# Patient Record
Sex: Female | Born: 1945 | Race: White | Hispanic: No | Marital: Married | Smoking: Never smoker
Health system: Southern US, Community
[De-identification: ages and names within clinical notes are randomized; demographics above are authoritative.]

## PROBLEM LIST (undated history)

## (undated) DIAGNOSIS — R609 Edema, unspecified: Secondary | ICD-10-CM

## (undated) DIAGNOSIS — I5032 Chronic diastolic (congestive) heart failure: Secondary | ICD-10-CM

## (undated) DIAGNOSIS — I10 Essential (primary) hypertension: Secondary | ICD-10-CM

## (undated) DIAGNOSIS — E785 Hyperlipidemia, unspecified: Secondary | ICD-10-CM

## (undated) DIAGNOSIS — M19042 Primary osteoarthritis, left hand: Secondary | ICD-10-CM

## (undated) DIAGNOSIS — I209 Angina pectoris, unspecified: Secondary | ICD-10-CM

## (undated) DIAGNOSIS — I272 Pulmonary hypertension, unspecified: Secondary | ICD-10-CM

## (undated) DIAGNOSIS — E11319 Type 2 diabetes mellitus with unspecified diabetic retinopathy without macular edema: Secondary | ICD-10-CM

## (undated) DIAGNOSIS — M51369 Other intervertebral disc degeneration, lumbar region without mention of lumbar back pain or lower extremity pain: Secondary | ICD-10-CM

## (undated) DIAGNOSIS — J309 Allergic rhinitis, unspecified: Secondary | ICD-10-CM

## (undated) DIAGNOSIS — E119 Type 2 diabetes mellitus without complications: Secondary | ICD-10-CM

## (undated) DIAGNOSIS — R0609 Other forms of dyspnea: Secondary | ICD-10-CM

## (undated) DIAGNOSIS — M48061 Spinal stenosis, lumbar region without neurogenic claudication: Secondary | ICD-10-CM

## (undated) DIAGNOSIS — I4729 Other ventricular tachycardia: Secondary | ICD-10-CM

## (undated) DIAGNOSIS — M5136 Other intervertebral disc degeneration, lumbar region: Secondary | ICD-10-CM

## (undated) DIAGNOSIS — F329 Major depressive disorder, single episode, unspecified: Secondary | ICD-10-CM

## (undated) DIAGNOSIS — F32A Depression, unspecified: Secondary | ICD-10-CM

## (undated) DIAGNOSIS — R0600 Dyspnea, unspecified: Secondary | ICD-10-CM

## (undated) DIAGNOSIS — E669 Obesity, unspecified: Secondary | ICD-10-CM

## (undated) DIAGNOSIS — M19041 Primary osteoarthritis, right hand: Secondary | ICD-10-CM

## (undated) DIAGNOSIS — G4733 Obstructive sleep apnea (adult) (pediatric): Secondary | ICD-10-CM

## (undated) DIAGNOSIS — G4730 Sleep apnea, unspecified: Secondary | ICD-10-CM

## (undated) DIAGNOSIS — M4802 Spinal stenosis, cervical region: Secondary | ICD-10-CM

## (undated) DIAGNOSIS — M810 Age-related osteoporosis without current pathological fracture: Secondary | ICD-10-CM

## (undated) DIAGNOSIS — M1990 Unspecified osteoarthritis, unspecified site: Secondary | ICD-10-CM

## (undated) DIAGNOSIS — K219 Gastro-esophageal reflux disease without esophagitis: Secondary | ICD-10-CM

## (undated) DIAGNOSIS — G959 Disease of spinal cord, unspecified: Secondary | ICD-10-CM

## (undated) DIAGNOSIS — N183 Chronic kidney disease, stage 3 unspecified: Secondary | ICD-10-CM

## (undated) DIAGNOSIS — Z22322 Carrier or suspected carrier of Methicillin resistant Staphylococcus aureus: Secondary | ICD-10-CM

## (undated) DIAGNOSIS — Z8672 Personal history of thrombophlebitis: Secondary | ICD-10-CM

## (undated) DIAGNOSIS — I700 Atherosclerosis of aorta: Secondary | ICD-10-CM

## (undated) DIAGNOSIS — D649 Anemia, unspecified: Secondary | ICD-10-CM

## (undated) DIAGNOSIS — I2510 Atherosclerotic heart disease of native coronary artery without angina pectoris: Secondary | ICD-10-CM

## (undated) DIAGNOSIS — G5600 Carpal tunnel syndrome, unspecified upper limb: Secondary | ICD-10-CM

## (undated) HISTORY — DX: Depression, unspecified: F32.A

## (undated) HISTORY — DX: Edema, unspecified: R60.9

## (undated) HISTORY — DX: Atherosclerotic heart disease of native coronary artery without angina pectoris: I25.10

## (undated) HISTORY — DX: Allergic rhinitis, unspecified: J30.9

## (undated) HISTORY — PX: ABDOMINAL HYSTERECTOMY: SHX81

## (undated) HISTORY — DX: Essential (primary) hypertension: I10

## (undated) HISTORY — DX: Hyperlipidemia, unspecified: E78.5

## (undated) HISTORY — DX: Major depressive disorder, single episode, unspecified: F32.9

## (undated) HISTORY — PX: ADENOIDECTOMY: SUR15

## (undated) HISTORY — DX: Unspecified osteoarthritis, unspecified site: M19.90

## (undated) HISTORY — DX: Chronic diastolic (congestive) heart failure: I50.32

## (undated) HISTORY — DX: Sleep apnea, unspecified: G47.30

## (undated) HISTORY — DX: Type 2 diabetes mellitus without complications: E11.9

## (undated) HISTORY — DX: Type 2 diabetes mellitus with unspecified diabetic retinopathy without macular edema: E11.319

## (undated) HISTORY — DX: Carrier or suspected carrier of methicillin resistant Staphylococcus aureus: Z22.322

---

## 1987-02-06 HISTORY — PX: CHOLECYSTECTOMY: SHX55

## 1987-02-06 HISTORY — PX: APPENDECTOMY: SHX54

## 1996-02-06 HISTORY — PX: OTHER SURGICAL HISTORY: SHX169

## 2001-08-05 HISTORY — PX: ROTATOR CUFF REPAIR: SHX139

## 2001-09-04 ENCOUNTER — Other Ambulatory Visit: Admission: RE | Admit: 2001-09-04 | Discharge: 2001-09-04 | Payer: Self-pay | Admitting: Family Medicine

## 2004-07-19 ENCOUNTER — Ambulatory Visit: Payer: Self-pay | Admitting: Family Medicine

## 2004-09-14 ENCOUNTER — Ambulatory Visit: Payer: Self-pay | Admitting: Family Medicine

## 2005-09-04 ENCOUNTER — Ambulatory Visit: Payer: Self-pay | Admitting: Family Medicine

## 2005-10-11 ENCOUNTER — Ambulatory Visit: Payer: Self-pay | Admitting: Family Medicine

## 2005-12-10 ENCOUNTER — Ambulatory Visit: Payer: Self-pay | Admitting: Family Medicine

## 2005-12-10 ENCOUNTER — Other Ambulatory Visit: Admission: RE | Admit: 2005-12-10 | Discharge: 2005-12-10 | Payer: Self-pay | Admitting: Family Medicine

## 2005-12-10 ENCOUNTER — Encounter: Payer: Self-pay | Admitting: Family Medicine

## 2005-12-24 LAB — FECAL OCCULT BLOOD, GUAIAC: Fecal Occult Blood: NEGATIVE

## 2005-12-31 ENCOUNTER — Ambulatory Visit: Payer: Self-pay | Admitting: Family Medicine

## 2006-03-08 DIAGNOSIS — Z22322 Carrier or suspected carrier of Methicillin resistant Staphylococcus aureus: Secondary | ICD-10-CM

## 2006-03-08 HISTORY — DX: Carrier or suspected carrier of methicillin resistant Staphylococcus aureus: Z22.322

## 2006-03-14 ENCOUNTER — Ambulatory Visit: Payer: Self-pay | Admitting: Family Medicine

## 2007-04-09 ENCOUNTER — Encounter: Payer: Self-pay | Admitting: Family Medicine

## 2007-04-15 ENCOUNTER — Telehealth: Payer: Self-pay | Admitting: Family Medicine

## 2007-05-22 ENCOUNTER — Encounter: Payer: Self-pay | Admitting: Family Medicine

## 2007-05-22 ENCOUNTER — Ambulatory Visit: Payer: Self-pay | Admitting: Family Medicine

## 2007-05-26 ENCOUNTER — Encounter (INDEPENDENT_AMBULATORY_CARE_PROVIDER_SITE_OTHER): Payer: Self-pay | Admitting: *Deleted

## 2007-06-04 ENCOUNTER — Encounter: Payer: Self-pay | Admitting: Family Medicine

## 2007-06-10 ENCOUNTER — Telehealth: Payer: Self-pay | Admitting: Family Medicine

## 2007-06-12 ENCOUNTER — Ambulatory Visit: Payer: Self-pay | Admitting: Family Medicine

## 2007-06-12 DIAGNOSIS — E7800 Pure hypercholesterolemia, unspecified: Secondary | ICD-10-CM

## 2007-06-13 LAB — CONVERTED CEMR LAB
ALT: 22 units/L (ref 0–35)
AST: 24 units/L (ref 0–37)
Basophils Absolute: 0 10*3/uL (ref 0.0–0.1)
Basophils Relative: 0.8 % (ref 0.0–1.0)
Bilirubin, Direct: 0.1 mg/dL (ref 0.0–0.3)
CO2: 26 meq/L (ref 19–32)
Calcium: 9.7 mg/dL (ref 8.4–10.5)
Chloride: 108 meq/L (ref 96–112)
Cholesterol: 149 mg/dL (ref 0–200)
Creatinine, Ser: 0.9 mg/dL (ref 0.4–1.2)
Glucose, Bld: 55 mg/dL — ABNORMAL LOW (ref 70–99)
Hemoglobin: 14.2 g/dL (ref 12.0–15.0)
LDL Cholesterol: 92 mg/dL (ref 0–99)
Lymphocytes Relative: 30.2 % (ref 12.0–46.0)
MCHC: 33.3 g/dL (ref 30.0–36.0)
Monocytes Relative: 8.4 % (ref 3.0–12.0)
Neutro Abs: 3.1 10*3/uL (ref 1.4–7.7)
Neutrophils Relative %: 57 % (ref 43.0–77.0)
RBC: 4.54 M/uL (ref 3.87–5.11)
TSH: 3.45 microintl units/mL (ref 0.35–5.50)
Total Bilirubin: 0.8 mg/dL (ref 0.3–1.2)
Total CHOL/HDL Ratio: 4.7
Total Protein: 7.7 g/dL (ref 6.0–8.3)
VLDL: 25 mg/dL (ref 0–40)
WBC: 5.5 10*3/uL (ref 4.5–10.5)

## 2007-10-03 ENCOUNTER — Encounter: Payer: Self-pay | Admitting: Family Medicine

## 2007-11-25 ENCOUNTER — Telehealth (INDEPENDENT_AMBULATORY_CARE_PROVIDER_SITE_OTHER): Payer: Self-pay | Admitting: *Deleted

## 2008-02-05 ENCOUNTER — Encounter: Payer: Self-pay | Admitting: Family Medicine

## 2008-02-12 ENCOUNTER — Encounter: Payer: Self-pay | Admitting: Family Medicine

## 2008-03-01 ENCOUNTER — Ambulatory Visit: Payer: Self-pay | Admitting: Family Medicine

## 2008-03-01 DIAGNOSIS — K219 Gastro-esophageal reflux disease without esophagitis: Secondary | ICD-10-CM | POA: Insufficient documentation

## 2008-03-01 DIAGNOSIS — I10 Essential (primary) hypertension: Secondary | ICD-10-CM

## 2008-03-01 DIAGNOSIS — E119 Type 2 diabetes mellitus without complications: Secondary | ICD-10-CM | POA: Insufficient documentation

## 2008-03-01 DIAGNOSIS — J309 Allergic rhinitis, unspecified: Secondary | ICD-10-CM | POA: Insufficient documentation

## 2008-03-15 ENCOUNTER — Encounter: Payer: Self-pay | Admitting: Family Medicine

## 2008-06-23 ENCOUNTER — Telehealth: Payer: Self-pay | Admitting: Family Medicine

## 2008-07-05 ENCOUNTER — Ambulatory Visit: Payer: Self-pay | Admitting: Family Medicine

## 2008-07-05 ENCOUNTER — Encounter: Payer: Self-pay | Admitting: Family Medicine

## 2008-07-07 ENCOUNTER — Encounter (INDEPENDENT_AMBULATORY_CARE_PROVIDER_SITE_OTHER): Payer: Self-pay | Admitting: *Deleted

## 2009-01-14 ENCOUNTER — Encounter: Payer: Self-pay | Admitting: Family Medicine

## 2009-05-09 ENCOUNTER — Encounter: Payer: Self-pay | Admitting: Family Medicine

## 2009-07-11 ENCOUNTER — Encounter: Payer: Self-pay | Admitting: Family Medicine

## 2009-11-08 ENCOUNTER — Encounter: Payer: Self-pay | Admitting: Family Medicine

## 2009-11-14 ENCOUNTER — Telehealth: Payer: Self-pay | Admitting: Family Medicine

## 2009-12-23 ENCOUNTER — Encounter: Payer: Self-pay | Admitting: Family Medicine

## 2009-12-23 ENCOUNTER — Ambulatory Visit: Payer: Self-pay | Admitting: Family Medicine

## 2009-12-27 ENCOUNTER — Encounter (INDEPENDENT_AMBULATORY_CARE_PROVIDER_SITE_OTHER): Payer: Self-pay | Admitting: *Deleted

## 2009-12-28 ENCOUNTER — Ambulatory Visit: Payer: Self-pay | Admitting: Family Medicine

## 2010-01-01 LAB — CONVERTED CEMR LAB
Basophils Relative: 0.6 % (ref 0.0–3.0)
Eosinophils Absolute: 0.1 10*3/uL (ref 0.0–0.7)
HCT: 38.3 % (ref 36.0–46.0)
Hemoglobin: 13.1 g/dL (ref 12.0–15.0)
Lymphs Abs: 1 10*3/uL (ref 0.7–4.0)
MCHC: 34.2 g/dL (ref 30.0–36.0)
MCV: 92.7 fL (ref 78.0–100.0)
Monocytes Absolute: 0.5 10*3/uL (ref 0.1–1.0)
Neutro Abs: 3.1 10*3/uL (ref 1.4–7.7)
RBC: 4.13 M/uL (ref 3.87–5.11)

## 2010-01-02 LAB — CONVERTED CEMR LAB
AST: 25 units/L (ref 0–37)
Alkaline Phosphatase: 64 units/L (ref 39–117)
BUN: 23 mg/dL (ref 6–23)
CO2: 23 meq/L (ref 19–32)
Calcium: 9.4 mg/dL (ref 8.4–10.5)
Chloride: 106 meq/L (ref 96–112)
Creatinine, Ser: 1.2 mg/dL (ref 0.40–1.20)
Indirect Bilirubin: 0.2 mg/dL (ref 0.0–0.9)
LDL Cholesterol: 70 mg/dL (ref 0–99)
Total Bilirubin: 0.3 mg/dL (ref 0.3–1.2)

## 2010-01-09 ENCOUNTER — Encounter: Payer: Self-pay | Admitting: Family Medicine

## 2010-03-07 NOTE — Letter (Signed)
Summary: [facility]   Imported By: Lanelle Bal 07/18/2009 08:38:36  _____________________________________________________________________  External Attachment:    Type:   Image     Comment:   External Document

## 2010-03-07 NOTE — Progress Notes (Signed)
Summary: needs order for mammogram  Phone Note Call from Patient Call back at Work Phone [phone]   Caller: Patient Call For: Judith Part MD Summary of Call: Pt needs order for mammogram, she wants to go to [facility], wants early morning appt, not a monday. Initial call taken by: Lowella Petties CMA,  November 14, 2009 10:04 AM  Follow-up for Phone Call        will do ref for [facility] Follow-up by: Judith Part MD,  November 14, 2009 10:37 AM  Additional Follow-up for Phone Call Additional follow up Details #1::        Patient notified as instructed by telephone. Pt will wait to hear from [facility].Lewanda Rife LPN  November 14, 2009 5:03 PM

## 2010-03-07 NOTE — Letter (Signed)
Summary: Results Follow up Letter  [place] at [facility]  [address] [place], [place] [address]   Phone: [phone]  Fax: [phone]    12/27/2009 MRN: [id number]      [facility] [address] [place], [place]  [address]    Dear Monica Ray,  The following are the results of your recent test(s):  Test         Result    Pap Smear:        Normal _____  Not Normal _____ Comments: ______________________________________________________ Cholesterol: LDL(Bad cholesterol):         Your goal is less than:         HDL (Good cholesterol):       Your goal is more than: Comments:  ______________________________________________________ Mammogram:        Normal __x___  Not Normal _____ Comments:Repeat in 1 year  ___________________________________________________________________ Hemoccult:        Normal _____  Not normal _______ Comments:    _____________________________________________________________________ Other Tests:    We routinely do not discuss normal results over the telephone.  If you desire a copy of the results, or you have any questions about this information we can discuss them at your next office visit.   Sincerely,  Roxy Manns MD

## 2010-03-07 NOTE — Letter (Signed)
Summary: [facility]  [facility]   Imported By: Lanelle Bal 02/24/2009 09:42:47  _____________________________________________________________________  External Attachment:    Type:   Image     Comment:   External Document  Appended Document: [facility]     Clinical Lists Changes  Observations: Added new observation of PAST MED HX: HTN hyperlipidemia  DM 2  DM retinopathy Edema endo- Morayati Mild Allergic rhinitis Depression OA- Hands  (02/24/2009 20:45) Added new observation of DMEYEEXAMNXT: 01/2010 (02/24/2009 20:45) Added new observation of DMEYEEXMRES: diabetic retinopathy (01/14/2009 20:46) Added new observation of EYE EXAM BY: Dr Benedict Needy (01/14/2009 20:46) Added new observation of DIAB EYE EX: diabetic retinopathy (01/14/2009 20:46)       Past Medical History:    HTN    hyperlipidemia     DM 2     DM retinopathy    Edema    endo- Morayati    Mild Allergic rhinitis    Depression    OA- Hands   Diabetes Management Exam:    Eye Exam:       Eye Exam done elsewhere          Date: 01/14/2009          Results: diabetic retinopathy          Done by: Dr Benedict Needy

## 2010-03-07 NOTE — Letter (Signed)
Summary: [facility]  [facility]   Imported By: Lanelle Bal 05/26/2009 08:34:57  _____________________________________________________________________  External Attachment:    Type:   Image     Comment:   External Document

## 2010-03-07 NOTE — Letter (Signed)
Summary: Sharin Grave MD  Sharin Grave MD   Imported By: Lanelle Bal 05/12/2009 14:18:47  _____________________________________________________________________  External Attachment:    Type:   Image     Comment:   External Document

## 2010-03-07 NOTE — Letter (Signed)
Summary: Dr. Sharin Grave  Dr. Sharin Grave   Imported By: Maryln Gottron 11/14/2009 15:01:32  _____________________________________________________________________  External Attachment:    Type:   Image     Comment:   External Document

## 2010-03-07 NOTE — Assessment & Plan Note (Signed)
Summary: F/U,REFILL MEDICATION/CLE   Vital Signs:  Patient profile:   65 year old female Height:      65.25 inches Weight:      222.50 pounds BMI:     36.88 Temp:     98.2 degrees F oral Pulse rate:   68 / minute Pulse rhythm:   regular BP sitting:   130 / 72  (left arm) Cuff size:   large  Vitals Entered By: Lewanda Rife LPN (December 28, 2009 8:13 AM) CC: refill med and follow up    History of Present Illness: here for f/u of HTN and lipids Dr Patrecia Pace takes care of her DM  per last note DM poorly controlled withAIc of 10.6 is on some medicine that has really lowered her blood sugar   lipids Last Lipid ProfileCholesterol: 149 (06/12/2007 9:49:00 AM)HDL:  31.8 (06/12/2007 9:49:00 AM)LDL:  92 (06/12/2007 9:49:00 AM)Triglycerides:  Last Liver profileSGOT:  24 (06/12/2007 9:49:00 AM)SPGT:  22 (06/12/2007 9:49:00 AM)T. 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   is on phenteramine for wt loss  she eats most when she gets home   pt declines both flu and pneumonia vaccines   opthy visit was in May -- Dr Benedict Needy -very mild DM retinopathy    Allergies: 1)  Diflucan (Fluconazole) 2)  Percocet  Past History:  Past Medical History: Last updated: 02/24/2009 HTN hyperlipidemia  DM 2  DM retinopathy Edema endo- Morayati Mild Allergic rhinitis Depression OA- Hands  Past Surgical History: Last updated: 03/01/2008 (1998) total Hyst (1989) CCY/ appy (08/2001) Rotator cuff (R) (1998) Barium Enema (neg) CT (03/2006) MRSA  Family History: Last updated: 03/01/2008 mother- lung ca at 67, DM, ETOH Father, Throat CA, ETOH MGM- DM Son is Drug addict  Social History: Last updated: 03/01/2008 non smoker   Married Divorced x 23 years then remarried   Review of Systems General:  Denies fatigue, fever, loss of appetite, and malaise. Eyes:  Denies blurring and eye irritation. CV:  Denies chest pain or discomfort and palpitations. Resp:  Denies cough, shortness of breath, and wheezing. GI:  Denies abdominal pain, change in bowel habits, and indigestion. MS:  Denies muscle aches and cramps. Derm:  Denies itching, lesion(s), poor wound healing, and rash. Neuro:  Denies numbness and tingling. Psych:  Complains of anxiety; eats when stressed/worried. Endo:  Denies cold intolerance, excessive thirst, excessive urination, and heat intolerance. Heme:  Denies abnormal bruising and bleeding.  Physical Exam  General:  overweight but generally well appearing  Head:  normocephalic, atraumatic, and no abnormalities observed.   Eyes:  vision grossly intact, pupils equal, pupils round, and pupils reactive to light.  no conjunctival pallor, injection or icterus  Mouth:  pharynx pink and moist.   Neck:  supple with full rom and no masses or thyromegally, no JVD or carotid bruit  Chest Wall:  No deformities, masses, or tenderness noted. Lungs:  Normal respiratory effort, chest expands symmetrically. Lungs are clear to auscultation, no crackles or wheezes. Heart:  Normal rate and regular rhythm. S1 and S2 normal without gallop, murmur, click, rub or other extra sounds. Abdomen:  Bowel sounds positive,abdomen soft and  non-tender without masses, organomegaly or hernias noted. no renal bruits  Msk:  No deformity or scoliosis noted of thoracic or lumbar spine.   Pulses:  R and L carotid,radial,femoral,dorsalis pedis and posterior tibial pulses are full and equal bilaterally Extremities:  No clubbing, cyanosis, edema, or deformity noted with normal full range of motion of all joints.   Neurologic:  sensation intact to light touch, gait normal, and DTRs symmetrical and normal.   Skin:  Intact without suspicious  lesions or rashes Cervical Nodes:  No lymphadenopathy noted Psych:  normal affect, talkative and pleasant   Diabetes Management Exam:    Foot Exam (with socks and/or shoes not present):       Sensory-Pinprick/Light touch:          Left medial foot (L-4): normal          Left dorsal foot (L-5): normal          Left lateral foot (S-1): normal          Right medial foot (L-4): normal          Right dorsal foot (L-5): normal          Right lateral foot (S-1): normal       Sensory-Monofilament:          Left foot: normal          Right foot: normal       Inspection:          Left foot: normal          Right foot: normal       Nails:          Left foot: normal          Right foot: normal    Eye Exam:       Eye Exam done elsewhere          Date: 06/05/2009          Results: diabetic retinopathy          Done by: Dr Benedict Needy   Impression & Recommendations:  Problem # 1:  HYPERTENSION (ICD-401.9) Assessment Unchanged  is well controlled lab today refilled meds disc healthy diet (low simple sugar/ choose complex carbs/ low sat fat) diet and exercise in detail  needs wt loss Her updated medication list for this problem includes:    Enalapril Maleate 20 Mg Tabs (Enalapril maleate) .Marland Kitchen... Take one by mouth two times a day    Furosemide 20 Mg Tabs (Furosemide) ..... One by mouth two times a day  Orders: Venipuncture (04540) TLB-CBC Platelet - w/Differential (85025-CBCD) T- * Misc. Laboratory test [phone])  BP today: 130/72 Prior BP: 124/70 (03/01/2008)  Labs Reviewed: K+: 3.7 (06/12/2007) Creat: : 0.9 (06/12/2007)   Chol: 149 (06/12/2007)   HDL: 31.8 (06/12/2007)   LDL: 92 (06/12/2007)   TG: 126 (06/12/2007)  Problem # 2:  HYPERCHOLESTEROLEMIA (ICD-272.0) Assessment: Unchanged  check lipids today on lipitor and tricor disc low sat fat diet  refills done  f/u 6 mo Her updated medication list for this problem includes:    Lipitor 40 Mg Tabs (Atorvastatin calcium) .Marland Kitchen... Take  one tab by mouth daily    Tricor 145 Mg Tabs (Fenofibrate) .Marland Kitchen... Take 1 tablet by mouth once a day  Orders: Venipuncture (14782) TLB-CBC Platelet - w/Differential (85025-CBCD) T- * Misc. Laboratory test [phone])  Labs Reviewed: SGOT: 24 (06/12/2007)   SGPT: 22 (06/12/2007)   HDL:31.8 (06/12/2007)  LDL:92 (06/12/2007)  Chol:149 (06/12/2007)  Trig:126 (06/12/2007)  Problem # 3:  DIABETES-TYPE 2 (ICD-250.00) Assessment: Deteriorated poorly controlled but improving  covered by Dr Judie Petit  disc poss need in future for counseling to stop compulsive overeating  Her updated medication list for this problem includes:    Enalapril Maleate 20 Mg Tabs (Enalapril maleate) .Marland Kitchen... Take one by mouth two times a day    Lantus 100 Unit/ml Soln (Insulin glargine) .Marland KitchenMarland KitchenMarland KitchenMarland Kitchen 60 units before breakfast and 60 units before supper    Humalog 100 Unit/ml Soln (Insulin lispro (human)) .Marland Kitchen... Three times a day 15-10-15 units sliding scale > 200    Actos 45 Mg Tabs (Pioglitazone hcl) .Marland Kitchen... Take 1 tablet by mouth once a day    Victoza 18 Mg/2ml Soln (Liraglutide) .Marland Kitchen... 1.8 units daily  Complete Medication List: 1)  Lipitor 40 Mg Tabs (Atorvastatin calcium) .... Take one tab by mouth daily 2)  Enalapril Maleate 20 Mg Tabs (Enalapril maleate) .... Take one by mouth two times a day 3)  Mobic 7.5 Mg Tabs (Meloxicam) .... Take one by mouth daily 4)  Furosemide 20 Mg Tabs (Furosemide) .... One by mouth two times a day 5)  Tricor 145 Mg Tabs (Fenofibrate) .... Take 1 tablet by mouth once a day 6)  Fish Oil Oil (Fish oil) .... Four tabs daily 7)  Lantus 100 Unit/ml Soln (Insulin glargine) .... 60 units before breakfast and 60 units before supper 8)  Humalog 100 Unit/ml Soln (Insulin lispro (human)) .... Three times a day 15-10-15 units sliding scale > 200 9)  Actos 45 Mg Tabs (Pioglitazone hcl) .... Take 1 tablet by mouth once a day 10)  Victoza 18 Mg/64ml Soln (Liraglutide) .... 1.8 units daily 11)  Phentermine Hcl 37.5 Mg Tabs  (Phentermine hcl) .... 1/2 tablet by mouth at lunch every day  Patient Instructions: 1)  please keep working on weight loss 2)  It is important that you exercise reguarly at least 20 minutes 5 times a week. If you develop chest pain, have severe difficulty breathing, or feel very tired, stop exercising immediately and seek medical attention.  3)  no change in meds 4)  lab today 5)  follow up in 6 months  Prescriptions: TRICOR 145 MG TABS (FENOFIBRATE) Take 1 tablet by mouth once a day  #30 x 11   Entered and Authorized by:   Judith Part MD   Signed by:   Judith Part MD on 12/28/2009   Method used:   Electronically to        [facility] (retail)       [address]       [place], [place]  [id number]       Ph: [phone]       Fax: [phone]   RxID:   [id number] FUROSEMIDE 20 MG TABS (FUROSEMIDE) one by mouth two times a day  #60 x 11   Entered and Authorized by:   Judith Part MD   Signed by:   Judith Part MD on 12/28/2009   Method used:   Electronically to        [facility] (retail)       [address]       [place], [place]  [id number]       Ph: [phone]       Fax: [phone]   RxID:   [id number] MOBIC 7.5 MG  TABS (MELOXICAM) take one  by mouth daily  #30 x 11   Entered and Authorized by:   Judith Part MD   Signed by:   Judith Part MD on 12/28/2009   Method used:   Electronically to        [facility] (retail)       [address]       [place], [place]  [address]       Ph: [phone]       Fax: [phone]   RxID:   [id number] ENALAPRIL MALEATE 20 MG  TABS (ENALAPRIL MALEATE) take one by mouth two times a day  #60 x 11   Entered and Authorized by:   Judith Part MD   Signed by:   Judith Part MD on 12/28/2009   Method used:   Electronically to        [facility] (retail)       [address]       [place], [place]  [address]       Ph: [phone]       Fax: [phone]   RxID:   [id number] LIPITOR 40 MG  TABS (ATORVASTATIN CALCIUM) take one tab by mouth daily  #30 x 11   Entered and Authorized by:   Judith Part MD   Signed by:   Judith Part MD on 12/28/2009   Method used:   Electronically to        [facility] (retail)       [address]       [place], [place]  [address]       Ph: [phone]       Fax: [phone]   RxID:   [id number]    Orders Added: 1)  Venipuncture [54627] 2)  TLB-CBC Platelet - w/Differential [85025-CBCD] 3)  T- * Misc. Laboratory test [99999] 4)  Est. Patient Level IV [03500]    Current Allergies (reviewed today): DIFLUCAN (FLUCONAZOLE) PERCOCET

## 2010-03-09 NOTE — Letter (Signed)
Summary: [facility]-Dr. [facility]-Dr. Patrecia Pace   Imported By: Maryln Gottron 01/16/2010 13:07:07  _____________________________________________________________________  External Attachment:    Type:   Image     Comment:   External Document

## 2011-01-15 ENCOUNTER — Other Ambulatory Visit: Payer: Self-pay

## 2011-01-15 MED ORDER — MELOXICAM 7.5 MG PO TABS: 7.5000 mg | ORAL_TABLET | Freq: Every day | ORAL | Status: DC

## 2011-01-15 NOTE — Telephone Encounter (Signed)
Monica Ray pharmacy request refill Meloxicam 7.5 mg last filled 12/14/10 and pt last seen 12/28/09.Please advise.

## 2011-01-15 NOTE — Telephone Encounter (Signed)
Please sched f/u this winter Px written for call in   I don't think [facility] does elect px

## 2011-01-16 ENCOUNTER — Other Ambulatory Visit: Payer: Self-pay

## 2011-01-16 MED ORDER — ENALAPRIL MALEATE 20 MG PO TABS: 20.0000 mg | ORAL_TABLET | Freq: Two times a day (BID) | ORAL | Status: DC

## 2011-01-16 NOTE — Telephone Encounter (Signed)
Monica Ray faxed refill request Enalapril 20 mg taking 1 tab by mouth two times daily.Pt last seen 12/28/09.last refill 10/13/10.Please advise. Pt does not have scheduled appt.

## 2011-01-16 NOTE — Telephone Encounter (Signed)
Unable to reach pt by phone. Medication phoned to [facility] pharmacy as instructed and note attached for pt to call for f/u this winter.

## 2011-01-16 NOTE — Telephone Encounter (Signed)
Please schedule f/u this winter-thanks for the heads up  Px written for call in

## 2011-01-18 NOTE — Telephone Encounter (Signed)
Medication phoned to [facility] as instructed. Pt scheduled f/u 02/20/11 at 8:30am.

## 2011-01-18 NOTE — Telephone Encounter (Signed)
Patient notified as instructed by telephone. Tried to call asher mcadams [phone], no answer and no v/m will try again.

## 2011-02-19 ENCOUNTER — Encounter: Payer: Self-pay | Admitting: Family Medicine

## 2011-02-20 ENCOUNTER — Encounter: Payer: Self-pay | Admitting: Family Medicine

## 2011-02-20 ENCOUNTER — Ambulatory Visit (INDEPENDENT_AMBULATORY_CARE_PROVIDER_SITE_OTHER): Payer: 59 | Admitting: Family Medicine

## 2011-02-20 VITALS — BP 144/72 | HR 92 | Temp 98.6°F | Ht 65.25 in | Wt 234.2 lb

## 2011-02-20 DIAGNOSIS — Z23 Encounter for immunization: Secondary | ICD-10-CM

## 2011-02-20 DIAGNOSIS — I10 Essential (primary) hypertension: Secondary | ICD-10-CM

## 2011-02-20 DIAGNOSIS — E7800 Pure hypercholesterolemia, unspecified: Secondary | ICD-10-CM

## 2011-02-20 DIAGNOSIS — Z1231 Encounter for screening mammogram for malignant neoplasm of breast: Secondary | ICD-10-CM

## 2011-02-20 MED ORDER — ROSUVASTATIN CALCIUM 10 MG PO TABS: 10.0000 mg | ORAL_TABLET | Freq: Every day | ORAL | Status: DC

## 2011-02-20 NOTE — Progress Notes (Signed)
Subjective:    Patient ID: Monica Ray, female    DOB: 02-Apr-1945, 66 y.o.   MRN: [id number]  HPI Here for f/u of HTN and lipids and to ref for mammogram  In general could feel better.....  Is concerned about lipitor  Went a month without it - and she felt a bit better -- a little less back pain  Wants to try something else Chol went way up  Is on med right now    Pt has DM followed by Dr Patrecia Pace On insulin and oral tx Insulin pump - for 4 weeks     Wt is up 12 lb bmi 38 Has lost 4 lb recently - proud of that   Has severe back problems -- with her back  Cannot exercise due to that      bp is 144/72     Today Has been very good at home and endo-- did not take pill today No cp or palpitations or headaches or edema  No side effects to medicines   On ace   Lipids Lab Results  Component Value Date   CHOL 133 12/28/2009   HDL 36* 12/28/2009   LDLCALC 70 12/28/2009   TRIG 136 12/28/2009   CHOLHDL 3.7 Ratio 12/28/2009   is on lipitor  Last labs 2 mo ago Dr Judie Petit-- off med and LDL was very high  Off the tricor now  Due for labs  Patient Active Problem List  Diagnoses  . DIABETES-TYPE 2  . HYPERCHOLESTEROLEMIA  . HYPERTENSION  . ALLERGIC RHINITIS  . GERD  . Other screening mammogram   Past Medical History  Diagnosis Date  . Hypertension   . Hyperlipidemia   . Diabetes mellitus, type 2   . DM retinopathy   . Edema   . Mild allergic rhinitis   . Depression   . OA (osteoarthritis)     hands  . MRSA (methicillin resistant staph aureus) culture positive 03/2006   Past Surgical History  Procedure Date  . Cholecystectomy 1989  . Appendectomy 1989  . Rotator cuff repair 08/2001    right  . Barium enema 1998    neg CT   History  Substance Use Topics  . Smoking status: Never Smoker   . Smokeless tobacco: Not on file  . Alcohol Use: Not on file   Family History  Problem Relation Age of Onset  . Cancer Mother 25    lung  . Diabetes Mother   .  Alcohol abuse Mother   . Alcohol abuse Father   . Cancer Father     throat  . Diabetes Maternal Grandmother    Allergies  Allergen Reactions  . Fluconazole     REACTION: Itching  . Oxycodone-Acetaminophen     REACTION: Itching   Current Outpatient Prescriptions on File Prior to Visit  Medication Sig Dispense Refill  . enalapril (VASOTEC) 20 MG tablet Take 1 tablet (20 mg total) by mouth 2 (two) times daily.  60 tablet  1  . furosemide (LASIX) 20 MG tablet Take 20 mg by mouth 2 (two) times daily.      . insulin lispro (HUMALOG) 100 UNIT/ML injection On insulin pump Dr Bonnita [place] monitors,      . meloxicam (MOBIC) 7.5 MG tablet Take 1 tablet (7.5 mg total) by mouth daily.  30 tablet  1  . Omega-3 Fatty Acids (FISH OIL PO) Take 2 capsules by mouth daily.       . pioglitazone (ACTOS) 45 MG tablet  Take 45 mg by mouth daily.      . insulin glargine (LANTUS) 100 UNIT/ML injection 60 units before breakfast and 60 units before supper      . Liraglutide (VICTOZA) 18 MG/3ML SOLN Inject into the skin. Inject 1.8 units daily      . phentermine (ADIPEX-P) 37.5 MG tablet Take 1/2 by mouth at lunch every day            Review of Systems Review of Systems  Constitutional: Negative for fever, appetite change, and unexpected weight change. pos for fatigue  Eyes: Negative for pain and visual disturbance.  Respiratory: Negative for cough and shortness of breath.   Cardiovascular: Negative for cp or palpitations    Gastrointestinal: Negative for nausea, diarrhea and constipation.  Genitourinary: Negative for urgency and frequency. no excessive thirst  Skin: Negative for pallor or rash   MSK pos for low back pain radiating to legs  Neurological: Negative for weakness, light-headedness, numbness and headaches.  Hematological: Negative for adenopathy. Does not bruise/bleed easily.  Psychiatric/Behavioral: Negative for dysphoric mood. The patient is not nervous/anxious.          Objective:    Physical Exam  Constitutional: She appears well-developed and well-nourished. No distress.       Obese and well appearing   HENT:  Head: Normocephalic and atraumatic.  Mouth/Throat: Oropharynx is clear and moist.  Eyes: Conjunctivae and EOM are normal. Pupils are equal, round, and reactive to light. No scleral icterus.  Neck: Normal range of motion. Neck supple. No JVD present. No thyromegaly present.  Cardiovascular: Normal rate, regular rhythm, normal heart sounds and intact distal pulses.  Exam reveals no gallop.   Pulmonary/Chest: Effort normal and breath sounds normal. No respiratory distress. She has no wheezes.  Abdominal: Soft. Bowel sounds are normal. She exhibits no distension, no abdominal bruit and no mass. There is no tenderness.  Musculoskeletal: Normal range of motion. She exhibits tenderness.       Trace pedal edema Lower lumbar tenderness with poor rom   Lymphadenopathy:    She has no cervical adenopathy.  Neurological: She is alert. She has normal reflexes. No cranial nerve deficit. She exhibits normal muscle tone. Coordination normal.  Skin: Skin is warm and dry. No rash noted. No erythema. No pallor.  Psychiatric: She has a normal mood and affect.          Assessment & Plan:

## 2011-02-20 NOTE — Assessment & Plan Note (Signed)
Scheduled annual screening mammogram Nl breast exam today  Encouraged monthly self exams   

## 2011-02-20 NOTE — Assessment & Plan Note (Signed)
Intolerant of lipitor but needs statin Will try crestor 10 mg and see how she does Lab 6 wk Rev low sat fat diet

## 2011-02-20 NOTE — Assessment & Plan Note (Signed)
bp in fair control at this time (up a bit without med today) No changes needed  Disc lifstyle change with low sodium diet and exercise   Sent for last labs Stressed imp of wt loss and exercise and disc this in great detail today

## 2011-02-20 NOTE — Patient Instructions (Signed)
Try crestor 10 instead of lipitor  Update me if side effects  Schedule fasting labs here in 6 weeks  Think about water or recumbant bike for exercise  Think about getting your back checked out  Please send for last labs from Dr Patrecia Pace We will schedule mammo at check out

## 2011-03-21 ENCOUNTER — Encounter: Payer: Self-pay | Admitting: Family Medicine

## 2011-03-21 ENCOUNTER — Ambulatory Visit: Payer: Self-pay | Admitting: Family Medicine

## 2011-03-26 ENCOUNTER — Encounter: Payer: Self-pay | Admitting: *Deleted

## 2011-04-03 ENCOUNTER — Other Ambulatory Visit (INDEPENDENT_AMBULATORY_CARE_PROVIDER_SITE_OTHER): Payer: 59

## 2011-04-03 DIAGNOSIS — E7800 Pure hypercholesterolemia, unspecified: Secondary | ICD-10-CM

## 2011-04-03 LAB — COMPREHENSIVE METABOLIC PANEL
ALT: 21 U/L (ref 0–35)
Albumin: 3.7 g/dL (ref 3.5–5.2)
CO2: 24 mEq/L (ref 19–32)
Chloride: 108 mEq/L (ref 96–112)
GFR: 70.68 mL/min (ref >60.00)
Potassium: 4 mEq/L (ref 3.5–5.1)
Sodium: 140 mEq/L (ref 135–145)
Total Bilirubin: 0.6 mg/dL (ref 0.3–1.2)
Total Protein: 7 g/dL (ref 6.0–8.3)

## 2011-04-03 LAB — LIPID PANEL
HDL: 39.5 mg/dL (ref >39.00)
Total CHOL/HDL Ratio: 4

## 2011-04-09 ENCOUNTER — Other Ambulatory Visit: Payer: Self-pay | Admitting: *Deleted

## 2011-04-09 MED ORDER — MELOXICAM 7.5 MG PO TABS: 7.5000 mg | ORAL_TABLET | Freq: Every day | ORAL | Status: DC

## 2011-04-09 MED ORDER — ENALAPRIL MALEATE 20 MG PO TABS: 20.0000 mg | ORAL_TABLET | Freq: Two times a day (BID) | ORAL | Status: DC

## 2011-04-09 NOTE — Telephone Encounter (Signed)
Will refill electronically  

## 2011-04-09 NOTE — Telephone Encounter (Signed)
Last refill: 12/11/2011

## 2011-06-25 ENCOUNTER — Encounter: Payer: Self-pay | Admitting: Family Medicine

## 2011-08-27 ENCOUNTER — Other Ambulatory Visit: Payer: Self-pay | Admitting: *Deleted

## 2011-08-27 MED ORDER — MELOXICAM 7.5 MG PO TABS: 7.5000 mg | ORAL_TABLET | Freq: Every day | ORAL | Status: DC

## 2011-08-27 NOTE — Telephone Encounter (Signed)
Will refill electronically  

## 2012-01-14 ENCOUNTER — Telehealth: Payer: Self-pay

## 2012-01-14 DIAGNOSIS — E119 Type 2 diabetes mellitus without complications: Secondary | ICD-10-CM

## 2012-01-14 NOTE — Telephone Encounter (Signed)
pt received letter from endocrinologist that she needed to find new doctor in next 30 days; pt request referral to Dr. Tedd Sias at [facility]. Please advise.

## 2012-01-15 NOTE — Telephone Encounter (Signed)
Appt made with Dr Tedd Sias patient aware.

## 2012-03-03 ENCOUNTER — Other Ambulatory Visit: Payer: Self-pay | Admitting: *Deleted

## 2012-03-03 NOTE — Telephone Encounter (Signed)
Please schedule f/u this spring and refil until then

## 2012-03-03 NOTE — Telephone Encounter (Signed)
Left voicemail requesting pt to call office 

## 2012-03-06 NOTE — Telephone Encounter (Signed)
Left 2nd voicemail requesting pt to call office  

## 2012-03-07 NOTE — Telephone Encounter (Signed)
Left 3rd voicemail and mailed letter letting pt know she needs to schedule a f/u appt before meds can be refilled

## 2012-03-14 NOTE — Telephone Encounter (Signed)
Called pt an still no answer and she never responded to my letter, declined meds until she has appt

## 2012-03-25 ENCOUNTER — Other Ambulatory Visit: Payer: Self-pay

## 2012-03-25 MED ORDER — ENALAPRIL MALEATE 20 MG PO TABS: 20.0000 mg | ORAL_TABLET | Freq: Two times a day (BID) | ORAL | Status: DC

## 2012-03-25 MED ORDER — FUROSEMIDE 20 MG PO TABS: 20.0000 mg | ORAL_TABLET | Freq: Two times a day (BID) | ORAL | Status: DC

## 2012-03-25 NOTE — Telephone Encounter (Signed)
Please give her 3 mo of those meds and let her know that she can now make her own mammogram appt at [facility]- she does not need a referral any longer-thanks

## 2012-03-25 NOTE — Telephone Encounter (Signed)
Pt left v/m requesting refill enalapril, furosemide (done) and meloxicam to [facility]. Pt scheduled appt to see Dr Milinda Antis 04/29/12. Pt also request an early morning appt for mammogram at [facility].Please advise. Pt request call back when done.

## 2012-03-26 MED ORDER — MELOXICAM 7.5 MG PO TABS: 7.5000 mg | ORAL_TABLET | Freq: Every day | ORAL | Status: DC

## 2012-03-26 NOTE — Telephone Encounter (Signed)
meds refilled and pt aware she can schedule her own mammo and I gave her their phone #

## 2012-04-29 ENCOUNTER — Encounter: Payer: Self-pay | Admitting: Family Medicine

## 2012-04-29 ENCOUNTER — Ambulatory Visit (INDEPENDENT_AMBULATORY_CARE_PROVIDER_SITE_OTHER): Payer: 59 | Admitting: Family Medicine

## 2012-04-29 VITALS — BP 112/70 | HR 70 | Temp 98.0°F

## 2012-04-29 DIAGNOSIS — I10 Essential (primary) hypertension: Secondary | ICD-10-CM

## 2012-04-29 DIAGNOSIS — Z8249 Family history of ischemic heart disease and other diseases of the circulatory system: Secondary | ICD-10-CM

## 2012-04-29 DIAGNOSIS — E7800 Pure hypercholesterolemia, unspecified: Secondary | ICD-10-CM

## 2012-04-29 DIAGNOSIS — E669 Obesity, unspecified: Secondary | ICD-10-CM | POA: Insufficient documentation

## 2012-04-29 LAB — CBC WITH DIFFERENTIAL/PLATELET
Basophils Absolute: 0.1 10*3/uL (ref 0.0–0.1)
Eosinophils Absolute: 0.2 10*3/uL (ref 0.0–0.7)
HCT: 41.2 % (ref 36.0–46.0)
Lymphs Abs: 1.1 10*3/uL (ref 0.7–4.0)
MCHC: 33.5 g/dL (ref 30.0–36.0)
MCV: 92 fl (ref 78.0–100.0)
Monocytes Absolute: 0.5 10*3/uL (ref 0.1–1.0)
Monocytes Relative: 10.7 % (ref 3.0–12.0)
Platelets: 273 10*3/uL (ref 150.0–400.0)
RDW: 13.7 % (ref 11.5–14.6)

## 2012-04-29 LAB — COMPREHENSIVE METABOLIC PANEL
ALT: 19 U/L (ref 0–35)
AST: 20 U/L (ref 0–37)
Alkaline Phosphatase: 88 U/L (ref 39–117)
Sodium: 140 mEq/L (ref 135–145)
Total Bilirubin: 0.6 mg/dL (ref 0.3–1.2)
Total Protein: 7.7 g/dL (ref 6.0–8.3)

## 2012-04-29 LAB — LIPID PANEL
HDL: 36.8 mg/dL — ABNORMAL LOW (ref >39.00)
Total CHOL/HDL Ratio: 4
Triglycerides: 159 mg/dL — ABNORMAL HIGH (ref 0.0–149.0)
VLDL: 31.8 mg/dL (ref 0.0–40.0)

## 2012-04-29 MED ORDER — ROSUVASTATIN CALCIUM 10 MG PO TABS: 10.0000 mg | ORAL_TABLET | Freq: Every day | ORAL | Status: DC

## 2012-04-29 MED ORDER — FUROSEMIDE 20 MG PO TABS: 20.0000 mg | ORAL_TABLET | Freq: Two times a day (BID) | ORAL | Status: DC

## 2012-04-29 MED ORDER — ENALAPRIL MALEATE 20 MG PO TABS: 20.0000 mg | ORAL_TABLET | Freq: Two times a day (BID) | ORAL | Status: DC

## 2012-04-29 MED ORDER — MELOXICAM 7.5 MG PO TABS: 7.5000 mg | ORAL_TABLET | Freq: Every day | ORAL | Status: DC

## 2012-04-29 NOTE — Assessment & Plan Note (Signed)
Discussed how this problem influences overall health and the risks it imposes  Reviewed plan for weight loss with lower calorie diet (via better food choices and also portion control or program like weight watchers) and exercise building up to or more than 30 minutes 5 days per week including some aerobic activity    Pt states she has not time for self care right now -and this is unfortunate- If wt increases, her medical problems will likely as well

## 2012-04-29 NOTE — Patient Instructions (Signed)
Labs today  Try your best to take care of yourself - diet and exercise  Call insurance co- let them know you have a strong family history of abdominal aortic aneurysm- ask if will pay for a screening ultrasound and let us know

## 2012-04-29 NOTE — Assessment & Plan Note (Signed)
bp in fair control at this time  No changes needed  Disc lifstyle change with low sodium diet and exercise  Lab today 

## 2012-04-29 NOTE — Progress Notes (Signed)
Subjective:    Patient ID: Monica Ray, female    DOB: 1946/01/11, 67 y.o.   MRN: [id number]  HPI Here for f/u of chronic problems    Lots of arthritis pain - especially in hands  Also tends to retain fluid  On actos and mobic    Has insulin pump - and her diabetes is improved - has made a big difference   bp is stable today  No cp or palpitations or headaches or edema  No side effects to medicines  BP Readings from Last 3 Encounters:  04/29/12 112/70  02/20/11 144/72  12/28/09 130/72     Diet and exercise - not good - no time - works very long hours and she in general cannot take care of herself the way she needs to  Back problems - cannot do a lot   She has fam hx of AAA in father and MGM- is worried about this and has risk factors She will look into ins cov for screening ultrasound   Due for labs for her HTN and chol Lab Results  Component Value Date   CHOL 144 04/03/2011   HDL 39.50 04/03/2011   LDLCALC 77 04/03/2011   TRIG 139.0 04/03/2011   CHOLHDL 4 04/03/2011   On crestor -no problems with that at 10 mg daily  Her diet is fair -not always optimal due to time limitations Absolutely no time for self care   Patient Active Problem List  Diagnosis  . DIABETES-TYPE 2  . HYPERCHOLESTEROLEMIA  . HYPERTENSION  . ALLERGIC RHINITIS  . GERD  . Other screening mammogram  . Family history of abdominal aortic aneurysm   Past Medical History  Diagnosis Date  . Hypertension   . Hyperlipidemia   . Diabetes mellitus, type 2   . DM retinopathy   . Edema   . Mild allergic rhinitis   . Depression   . OA (osteoarthritis)     hands  . MRSA (methicillin resistant staph aureus) culture positive 03/2006   Past Surgical History  Procedure Laterality Date  . Cholecystectomy  1989  . Appendectomy  1989  . Rotator cuff repair  08/2001    right  . Barium enema  1998    neg CT   History  Substance Use Topics  . Smoking status: Never Smoker   . Smokeless tobacco: Not  on file  . Alcohol Use: Not on file   Family History  Problem Relation Age of Onset  . Cancer Mother 7    lung  . Diabetes Mother   . Alcohol abuse Mother   . Alcohol abuse Father   . Cancer Father     throat  . Aneurysm Father   . Diabetes Maternal Grandmother   . Aneurysm Paternal Grandmother    Allergies  Allergen Reactions  . Fluconazole     REACTION: Itching  . Oxycodone-Acetaminophen     REACTION: Itching   Current Outpatient Prescriptions on File Prior to Visit  Medication Sig Dispense Refill  . insulin glargine (LANTUS) 100 UNIT/ML injection 60 units before breakfast and 60 units before supper      . insulin lispro (HUMALOG) 100 UNIT/ML injection On insulin pump Dr Bonnita Hollow monitors,      . pioglitazone (ACTOS) 45 MG tablet Take 45 mg by mouth daily.      Marland Kitchen VITAMIN D, CHOLECALCIFEROL, PO Take 1 tablet by mouth once a week.      . calcium elemental as carbonate (BARIATRIC TUMS ULTRA) 400  MG tablet Chew 1,000 mg by mouth daily.      . Liraglutide (VICTOZA) 18 MG/3ML SOLN Inject into the skin. Inject 1.8 units daily      . Omega-3 Fatty Acids (FISH OIL PO) Take 2 capsules by mouth daily.       . phentermine (ADIPEX-P) 37.5 MG tablet Take 1/2 by mouth at lunch every day      . rosuvastatin (CRESTOR) 10 MG tablet Take 1 tablet (10 mg total) by mouth daily.  30 tablet  11   No current facility-administered medications on file prior to visit.     Review of Systems Review of Systems  Constitutional: Negative for fever, appetite change,  and unexpected weight change. pos for fatigue from her schedule Eyes: Negative for pain and visual disturbance.  Respiratory: Negative for cough and shortness of breath.   Cardiovascular: Negative for cp or palpitations    Gastrointestinal: Negative for nausea, diarrhea and constipation. neg for abd pain  Genitourinary: Negative for urgency and frequency.  Skin: Negative for pallor or rash   MSK pos for chronic back pain/  sciatica Neurological: Negative for weakness, light-headedness, numbness and headaches.  Hematological: Negative for adenopathy. Does not bruise/bleed easily.  Psychiatric/Behavioral: Negative for dysphoric mood. The patient is not nervous/anxious.         Objective:   Physical Exam  Constitutional: She appears well-developed and well-nourished. No distress.  obese and well appearing   HENT:  Head: Normocephalic and atraumatic.  Mouth/Throat: Oropharynx is clear and moist.  Eyes: Conjunctivae and EOM are normal. Pupils are equal, round, and reactive to light. Right eye exhibits no discharge. Left eye exhibits no discharge. No scleral icterus.  Neck: Normal range of motion. Neck supple. No JVD present. Carotid bruit is not present. No thyromegaly present.  Cardiovascular: Normal rate, regular rhythm, normal heart sounds and intact distal pulses.  Exam reveals no gallop.   Pulmonary/Chest: Effort normal and breath sounds normal. No respiratory distress. She has no wheezes.  Abdominal: Soft. Bowel sounds are normal. She exhibits no distension, no abdominal bruit, no pulsatile midline mass and no mass. There is no tenderness.  Musculoskeletal: She exhibits no edema.  Lymphadenopathy:    She has no cervical adenopathy.  Neurological: She is alert. She has normal reflexes.  Skin: Skin is warm and dry. No rash noted. No erythema. No pallor.  Psychiatric: She has a normal mood and affect.          Assessment & Plan:

## 2012-04-29 NOTE — Assessment & Plan Note (Signed)
Lipid check on crestor and diet  Overall - diet and exercise are poor Rev low sat fat diet

## 2012-04-29 NOTE — Assessment & Plan Note (Signed)
Father and MGM- she will call ins to see if they pay for screening

## 2012-04-30 ENCOUNTER — Encounter: Payer: Self-pay | Admitting: *Deleted

## 2012-07-25 ENCOUNTER — Ambulatory Visit: Payer: Self-pay | Admitting: Family Medicine

## 2012-07-25 ENCOUNTER — Encounter: Payer: Self-pay | Admitting: Family Medicine

## 2012-07-28 ENCOUNTER — Encounter: Payer: Self-pay | Admitting: *Deleted

## 2013-04-30 ENCOUNTER — Other Ambulatory Visit: Payer: Self-pay | Admitting: *Deleted

## 2013-04-30 MED ORDER — ENALAPRIL MALEATE 20 MG PO TABS: ORAL_TABLET | ORAL | Status: DC

## 2013-07-08 ENCOUNTER — Telehealth: Payer: Self-pay

## 2013-07-08 NOTE — Telephone Encounter (Signed)
Pt called to get mammogram referral to [facility] in [place]; advised pt since screening mammogram she can call and schedule appt at [phone]. Pt voiced understanding; pt will be checking with Clydene Pugh mcadams to see if refills needed. Pt has appt with Dr Milinda Antis 07/14/13.

## 2013-07-10 ENCOUNTER — Other Ambulatory Visit: Payer: Self-pay | Admitting: Family Medicine

## 2013-07-10 NOTE — Telephone Encounter (Signed)
Meloxicam, sorry.

## 2013-07-10 NOTE — Telephone Encounter (Signed)
Please refill times one  She has a f/u appt with me next week

## 2013-07-10 NOTE — Telephone Encounter (Signed)
Please schedule f/u in the summer and refill until then -thanks

## 2013-07-10 NOTE — Telephone Encounter (Signed)
What medicine?

## 2013-07-10 NOTE — Telephone Encounter (Signed)
Last office visit was 04/29/12 and Rx last filled at this visit with 11 refills--please advise

## 2013-07-10 NOTE — Telephone Encounter (Signed)
Last filled 03/18/13, ok to refill?

## 2013-07-13 MED ORDER — MELOXICAM 7.5 MG PO TABS: 7.5000 mg | ORAL_TABLET | Freq: Every day | ORAL | Status: DC

## 2013-07-13 NOTE — Telephone Encounter (Signed)
Refilled once, pt has f/u tomorrow

## 2013-07-14 ENCOUNTER — Ambulatory Visit (INDEPENDENT_AMBULATORY_CARE_PROVIDER_SITE_OTHER): Payer: Medicare Other | Admitting: Family Medicine

## 2013-07-14 ENCOUNTER — Encounter: Payer: Self-pay | Admitting: Family Medicine

## 2013-07-14 VITALS — BP 132/70 | HR 72 | Temp 98.7°F | Ht 65.25 in | Wt 246.2 lb

## 2013-07-14 DIAGNOSIS — I10 Essential (primary) hypertension: Secondary | ICD-10-CM

## 2013-07-14 DIAGNOSIS — Z23 Encounter for immunization: Secondary | ICD-10-CM

## 2013-07-14 DIAGNOSIS — E669 Obesity, unspecified: Secondary | ICD-10-CM

## 2013-07-14 DIAGNOSIS — E7800 Pure hypercholesterolemia, unspecified: Secondary | ICD-10-CM

## 2013-07-14 LAB — CBC WITH DIFFERENTIAL/PLATELET
BASOS ABS: 0 10*3/uL (ref 0.0–0.1)
Basophils Relative: 0.7 % (ref 0.0–3.0)
EOS PCT: 4.5 % (ref 0.0–5.0)
Eosinophils Absolute: 0.3 10*3/uL (ref 0.0–0.7)
HEMATOCRIT: 43.1 % (ref 36.0–46.0)
HEMOGLOBIN: 14.2 g/dL (ref 12.0–15.0)
LYMPHS ABS: 1.8 10*3/uL (ref 0.7–4.0)
Lymphocytes Relative: 26.2 % (ref 12.0–46.0)
MCHC: 33 g/dL (ref 30.0–36.0)
MCV: 93.3 fl (ref 78.0–100.0)
MONO ABS: 0.6 10*3/uL (ref 0.1–1.0)
MONOS PCT: 9.4 % (ref 3.0–12.0)
NEUTROS ABS: 4 10*3/uL (ref 1.4–7.7)
Neutrophils Relative %: 59.2 % (ref 43.0–77.0)
PLATELETS: 252 10*3/uL (ref 150.0–400.0)
RBC: 4.61 Mil/uL (ref 3.87–5.11)
RDW: 13.5 % (ref 11.5–15.5)
WBC: 6.8 10*3/uL (ref 4.0–10.5)

## 2013-07-14 LAB — LIPID PANEL
Cholesterol: 187 mg/dL (ref 0–200)
HDL: 38.9 mg/dL — AB (ref >39.00)
LDL Cholesterol: 127 mg/dL — ABNORMAL HIGH (ref 0–99)
NonHDL: 148.1
TRIGLYCERIDES: 105 mg/dL (ref 0.0–149.0)
Total CHOL/HDL Ratio: 5
VLDL: 21 mg/dL (ref 0.0–40.0)

## 2013-07-14 LAB — COMPREHENSIVE METABOLIC PANEL
ALK PHOS: 87 U/L (ref 39–117)
ALT: 37 U/L — ABNORMAL HIGH (ref 0–35)
AST: 42 U/L — ABNORMAL HIGH (ref 0–37)
Albumin: 3.6 g/dL (ref 3.5–5.2)
BILIRUBIN TOTAL: 0.9 mg/dL (ref 0.2–1.2)
BUN: 20 mg/dL (ref 6–23)
CO2: 23 meq/L (ref 19–32)
CREATININE: 1.1 mg/dL (ref 0.4–1.2)
Calcium: 9.2 mg/dL (ref 8.4–10.5)
Chloride: 106 mEq/L (ref 96–112)
GFR: 52.28 mL/min — AB (ref >60.00)
GLUCOSE: 177 mg/dL — AB (ref 70–99)
Potassium: 4.4 mEq/L (ref 3.5–5.1)
SODIUM: 139 meq/L (ref 135–145)
Total Protein: 7.2 g/dL (ref 6.0–8.3)

## 2013-07-14 LAB — TSH: TSH: 2.93 u[IU]/mL (ref 0.35–4.50)

## 2013-07-14 MED ORDER — ROSUVASTATIN CALCIUM 10 MG PO TABS: 10.0000 mg | ORAL_TABLET | Freq: Every day | ORAL | Status: DC

## 2013-07-14 MED ORDER — ENALAPRIL MALEATE 20 MG PO TABS: ORAL_TABLET | ORAL | Status: DC

## 2013-07-14 MED ORDER — FUROSEMIDE 20 MG PO TABS: 20.0000 mg | ORAL_TABLET | Freq: Two times a day (BID) | ORAL | Status: DC

## 2013-07-14 NOTE — Assessment & Plan Note (Signed)
bp in fair control at this time  BP Readings from Last 1 Encounters:  07/14/13 132/70  this is imp from last visit Med refilled Lab today No changes needed Disc lifstyle change with low sodium diet and exercise

## 2013-07-14 NOTE — Assessment & Plan Note (Signed)
Discussed how this problem influences overall health and the risks it imposes  Reviewed plan for weight loss with lower calorie diet (via better food choices and also portion control or program like weight watchers) and exercise building up to or more than 30 minutes 5 days per week including some aerobic activity   Pt states she cannot work on this currently because her job is overwhelming -without any hope of improvement or retirement  This is unfortunate Wt gain of 12 lb since last visit and uncontrolled dm

## 2013-07-14 NOTE — Patient Instructions (Signed)
If you are interested in a shingles/zoster vaccine - call your insurance to check on coverage,( you should not get it within 1 month of other vaccines) , then call us for a prescription  for it to take to a pharmacy that gives the shot , or make a nurse visit to get it here depending on your coverage  Pneumonia vaccine today  Labs today   Try to take care of yourself

## 2013-07-14 NOTE — Assessment & Plan Note (Signed)
Due for labs on crestor and diet  Rev low sat fat diet  Lab today

## 2013-07-14 NOTE — Progress Notes (Signed)
Subjective:    Patient ID: Monica Ray, female    DOB: 24-Jan-1946, 68 y.o.   MRN: [id number]  HPI Here for f/u of chronic medical problems   Went on medicare since last visit - this has been complicated  Having trouble getting insulin paid for - sees Dr Tedd Sias will change her meds Had a check up yesterday-improved with A1C of 9   Wt is up 12 lb with bmi of 40   She takes fair care of herself  Has a derm appt - upcoming , has a mole on her lip that will likely be removed She tries to stay out of the sun   bp is stable today  No cp or palpitations or headaches or edema  No side effects to medicines  BP Readings from Last 3 Encounters:  07/14/13 132/70  04/29/12 112/70  02/20/11 144/72     We need a cholesterol check- overdue   Diet is "terrible"- she is not motivated to change it  She blames her job- worse and worse hours -no time to care for herself and emotional eating perhaps  No time to exercise  She cannot afford to retire   Patient Active Problem List   Diagnosis Date Noted  . Family history of abdominal aortic aneurysm 04/29/2012  . Obesity, unspecified 04/29/2012  . Other screening mammogram 02/20/2011  . DIABETES-TYPE 2 03/01/2008  . HYPERTENSION 03/01/2008  . ALLERGIC RHINITIS 03/01/2008  . GERD 03/01/2008  . HYPERCHOLESTEROLEMIA 06/12/2007   Past Medical History  Diagnosis Date  . Hypertension   . Hyperlipidemia   . Diabetes mellitus, type 2   . DM retinopathy   . Edema   . Mild allergic rhinitis   . Depression   . OA (osteoarthritis)     hands  . MRSA (methicillin resistant staph aureus) culture positive 03/2006   Past Surgical History  Procedure Laterality Date  . Cholecystectomy  1989  . Appendectomy  1989  . Rotator cuff repair  08/2001    right  . Barium enema  1998    neg CT   History  Substance Use Topics  . Smoking status: Never Smoker   . Smokeless tobacco: Not on file  . Alcohol Use: No   Family History  Problem Relation  Age of Onset  . Cancer Mother 65    lung  . Diabetes Mother   . Alcohol abuse Mother   . Alcohol abuse Father   . Cancer Father     throat  . Aneurysm Father   . Diabetes Maternal Grandmother   . Aneurysm Paternal Grandmother    Allergies  Allergen Reactions  . Amoxicillin Hives    Break out in rash  . Fluconazole     REACTION: Itching  . Oxycodone-Acetaminophen     REACTION: Itching   Current Outpatient Prescriptions on File Prior to Visit  Medication Sig Dispense Refill  . enalapril (VASOTEC) 20 MG tablet One tablet by mouth twice daily. *Please make an appointment for fasting labs, and one with Dr the following week, for refills*  60 tablet  0  . furosemide (LASIX) 20 MG tablet Take 1 tablet (20 mg total) by mouth 2 (two) times daily.  60 tablet  11  . insulin lispro (HUMALOG) 100 UNIT/ML injection On insulin pump Dr Bonnita Hollow monitors,      . meloxicam (MOBIC) 7.5 MG tablet Take 1 tablet (7.5 mg total) by mouth daily.  30 tablet  0  . pioglitazone (ACTOS) 45 MG  tablet Take 45 mg by mouth daily.      . rosuvastatin (CRESTOR) 10 MG tablet Take 1 tablet (10 mg total) by mouth daily.  30 tablet  11  . VITAMIN D, CHOLECALCIFEROL, PO Take 1 tablet by mouth once a week.       No current facility-administered medications on file prior to visit.     Review of Systems    Review of Systems  Constitutional: Negative for fever, appetite change,  and unexpected weight change.  Eyes: Negative for pain and visual disturbance.  Respiratory: Negative for cough and shortness of breath.   Cardiovascular: Negative for cp or palpitations    Gastrointestinal: Negative for nausea, diarrhea and constipation.  Genitourinary: Negative for urgency and frequency.  Skin: Negative for pallor or rash   Neurological: Negative for weakness, light-headedness, numbness and headaches.  Hematological: Negative for adenopathy. Does not bruise/bleed easily.  Psychiatric/Behavioral: Negative for dysphoric  mood. The patient is not nervous/anxious.  pos for dec in motivation     Objective:   Physical Exam  Constitutional: She appears well-developed and well-nourished. No distress.  obese and well appearing   HENT:  Head: Normocephalic and atraumatic.  Right Ear: External ear normal.  Left Ear: External ear normal.  Nose: Nose normal.  Mouth/Throat: Oropharynx is clear and moist.  Eyes: Conjunctivae and EOM are normal. Pupils are equal, round, and reactive to light. Right eye exhibits no discharge. Left eye exhibits no discharge. No scleral icterus.  Neck: Normal range of motion. Neck supple. No JVD present. No thyromegaly present.  Cardiovascular: Normal rate, regular rhythm, normal heart sounds and intact distal pulses.  Exam reveals no gallop.   Pulmonary/Chest: Effort normal and breath sounds normal. No respiratory distress. She has no wheezes. She has no rales.  Abdominal: Soft. Bowel sounds are normal. She exhibits no distension and no mass. There is no tenderness.  Musculoskeletal: She exhibits no edema and no tenderness.  Lymphadenopathy:    She has no cervical adenopathy.  Neurological: She is alert. She has normal reflexes. No cranial nerve deficit. She exhibits normal muscle tone. Coordination normal.  Skin: Skin is warm and dry. No rash noted. No erythema. No pallor.  Psychiatric: She has a normal mood and affect.          Assessment & Plan:   Problem List Items Addressed This Visit     Cardiovascular and Mediastinum   HYPERTENSION - Primary      bp in fair control at this time  BP Readings from Last 1 Encounters:  07/14/13 132/70  this is imp from last visit Med refilled Lab today No changes needed Disc lifstyle change with low sodium diet and exercise      Relevant Medications      enalapril (VASOTEC) tablet      furosemide (LASIX) tablet      rosuvastatin (CRESTOR) tablet   Other Relevant Orders      CBC with Differential (Completed)      Comprehensive  metabolic panel (Completed)      TSH (Completed)      Lipid panel (Completed)     Other   HYPERCHOLESTEROLEMIA     Due for labs on crestor and diet  Rev low sat fat diet  Lab today    Relevant Medications      enalapril (VASOTEC) tablet      furosemide (LASIX) tablet      rosuvastatin (CRESTOR) tablet   Other Relevant Orders  Lipid panel (Completed)   Obesity, unspecified     Discussed how this problem influences overall health and the risks it imposes  Reviewed plan for weight loss with lower calorie diet (via better food choices and also portion control or program like weight watchers) and exercise building up to or more than 30 minutes 5 days per week including some aerobic activity   Pt states she cannot work on this currently because her job is overwhelming -without any hope of improvement or retirement  This is unfortunate Wt gain of 12 lb since last visit and uncontrolled dm     Other Visit Diagnoses   Need for 23-polyvalent pneumococcal polysaccharide vaccine        Relevant Orders       Pneumococcal polysaccharide vaccine 23-valent greater than or equal to 2yo subcutaneous/IM (Completed)

## 2013-07-14 NOTE — Progress Notes (Signed)
Pre visit review using our clinic review tool, if applicable. No additional management support is needed unless otherwise documented below in the visit note. 

## 2013-07-15 ENCOUNTER — Telehealth: Payer: Self-pay | Admitting: Family Medicine

## 2013-07-15 NOTE — Telephone Encounter (Signed)
Relevant patient education assigned to patient using Emmi. ° °

## 2013-07-27 ENCOUNTER — Encounter: Payer: Self-pay | Admitting: *Deleted

## 2013-08-10 ENCOUNTER — Other Ambulatory Visit: Payer: Self-pay | Admitting: Family Medicine

## 2013-08-10 NOTE — Telephone Encounter (Signed)
Electronic refill request, please advise  

## 2013-08-10 NOTE — Telephone Encounter (Signed)
Please refill for 6 months 

## 2013-10-02 ENCOUNTER — Other Ambulatory Visit: Payer: Self-pay | Admitting: Family Medicine

## 2013-10-02 DIAGNOSIS — E7800 Pure hypercholesterolemia, unspecified: Secondary | ICD-10-CM

## 2013-10-02 DIAGNOSIS — E119 Type 2 diabetes mellitus without complications: Secondary | ICD-10-CM

## 2013-10-07 ENCOUNTER — Other Ambulatory Visit (INDEPENDENT_AMBULATORY_CARE_PROVIDER_SITE_OTHER): Payer: Medicare Other

## 2013-10-07 DIAGNOSIS — I10 Essential (primary) hypertension: Secondary | ICD-10-CM

## 2013-10-07 DIAGNOSIS — E119 Type 2 diabetes mellitus without complications: Secondary | ICD-10-CM

## 2013-10-07 DIAGNOSIS — E7800 Pure hypercholesterolemia, unspecified: Secondary | ICD-10-CM

## 2013-10-07 LAB — LIPID PANEL
CHOL/HDL RATIO: 4
Cholesterol: 125 mg/dL (ref 0–200)
HDL: 33.9 mg/dL — ABNORMAL LOW (ref >39.00)
LDL Cholesterol: 71 mg/dL (ref 0–99)
NonHDL: 91.1
Triglycerides: 103 mg/dL (ref 0.0–149.0)
VLDL: 20.6 mg/dL (ref 0.0–40.0)

## 2013-10-07 LAB — HEPATIC FUNCTION PANEL
ALK PHOS: 80 U/L (ref 39–117)
ALT: 23 U/L (ref 0–35)
AST: 28 U/L (ref 0–37)
Albumin: 3.5 g/dL (ref 3.5–5.2)
BILIRUBIN TOTAL: 0.7 mg/dL (ref 0.2–1.2)
Bilirubin, Direct: 0 mg/dL (ref 0.0–0.3)
Total Protein: 7.4 g/dL (ref 6.0–8.3)

## 2013-10-07 LAB — HEMOGLOBIN A1C: HEMOGLOBIN A1C: 8.7 % — AB (ref 4.6–6.5)

## 2013-10-07 NOTE — Addendum Note (Signed)
Addended by: Baldomero Lamy on: 10/07/2013 08:12 AM   Modules accepted: Orders

## 2013-10-08 ENCOUNTER — Encounter: Payer: Self-pay | Admitting: *Deleted

## 2013-12-09 ENCOUNTER — Ambulatory Visit: Payer: Self-pay | Admitting: Family Medicine

## 2013-12-10 ENCOUNTER — Encounter: Payer: Self-pay | Admitting: Family Medicine

## 2013-12-11 ENCOUNTER — Other Ambulatory Visit: Payer: Self-pay | Admitting: Family Medicine

## 2013-12-14 ENCOUNTER — Encounter: Payer: Self-pay | Admitting: *Deleted

## 2014-04-10 ENCOUNTER — Other Ambulatory Visit: Payer: Self-pay | Admitting: Family Medicine

## 2014-05-10 DIAGNOSIS — IMO0002 Reserved for concepts with insufficient information to code with codable children: Secondary | ICD-10-CM | POA: Insufficient documentation

## 2014-07-26 ENCOUNTER — Other Ambulatory Visit: Payer: Self-pay | Admitting: Physical Medicine and Rehabilitation

## 2014-07-26 DIAGNOSIS — M545 Low back pain: Secondary | ICD-10-CM

## 2014-08-12 ENCOUNTER — Ambulatory Visit
Admission: RE | Admit: 2014-08-12 | Discharge: 2014-08-12 | Disposition: A | Discharge status: Home / Self Care | Payer: Medicare Other | Source: Ambulatory Visit | Attending: Physical Medicine and Rehabilitation | Admitting: Physical Medicine and Rehabilitation

## 2014-08-12 DIAGNOSIS — M545 Low back pain: Secondary | ICD-10-CM

## 2014-08-17 ENCOUNTER — Other Ambulatory Visit: Payer: Self-pay | Admitting: Family Medicine

## 2014-08-30 DIAGNOSIS — M51369 Other intervertebral disc degeneration, lumbar region without mention of lumbar back pain or lower extremity pain: Secondary | ICD-10-CM | POA: Insufficient documentation

## 2014-08-30 DIAGNOSIS — M5136 Other intervertebral disc degeneration, lumbar region: Secondary | ICD-10-CM | POA: Insufficient documentation

## 2014-08-30 DIAGNOSIS — M5416 Radiculopathy, lumbar region: Secondary | ICD-10-CM | POA: Insufficient documentation

## 2014-09-30 ENCOUNTER — Other Ambulatory Visit: Payer: Self-pay | Admitting: Neurosurgery

## 2014-09-30 DIAGNOSIS — M542 Cervicalgia: Secondary | ICD-10-CM

## 2014-10-14 ENCOUNTER — Ambulatory Visit
Admission: RE | Admit: 2014-10-14 | Discharge: 2014-10-14 | Disposition: A | Discharge status: Home / Self Care | Payer: Medicare Other | Source: Ambulatory Visit | Attending: Neurosurgery | Admitting: Neurosurgery

## 2014-10-14 DIAGNOSIS — M542 Cervicalgia: Secondary | ICD-10-CM

## 2014-10-21 ENCOUNTER — Other Ambulatory Visit: Payer: Self-pay | Admitting: Neurosurgery

## 2014-11-17 ENCOUNTER — Other Ambulatory Visit (HOSPITAL_COMMUNITY): Payer: Medicare Other

## 2014-11-17 ENCOUNTER — Other Ambulatory Visit (HOSPITAL_COMMUNITY): Payer: Self-pay | Admitting: *Deleted

## 2014-11-17 ENCOUNTER — Encounter (HOSPITAL_COMMUNITY)
Admission: RE | Admit: 2014-11-17 | Discharge: 2014-11-17 | Disposition: A | Discharge status: Home / Self Care | Payer: Medicare Other | Source: Ambulatory Visit | Attending: Neurosurgery | Admitting: Neurosurgery

## 2014-11-17 ENCOUNTER — Encounter (HOSPITAL_COMMUNITY): Payer: Self-pay

## 2014-11-17 DIAGNOSIS — Z794 Long term (current) use of insulin: Secondary | ICD-10-CM | POA: Diagnosis not present

## 2014-11-17 DIAGNOSIS — Z79899 Other long term (current) drug therapy: Secondary | ICD-10-CM | POA: Insufficient documentation

## 2014-11-17 DIAGNOSIS — E119 Type 2 diabetes mellitus without complications: Secondary | ICD-10-CM | POA: Diagnosis not present

## 2014-11-17 DIAGNOSIS — M4806 Spinal stenosis, lumbar region: Secondary | ICD-10-CM | POA: Insufficient documentation

## 2014-11-17 DIAGNOSIS — D649 Anemia, unspecified: Secondary | ICD-10-CM | POA: Insufficient documentation

## 2014-11-17 DIAGNOSIS — M5136 Other intervertebral disc degeneration, lumbar region: Secondary | ICD-10-CM | POA: Diagnosis not present

## 2014-11-17 DIAGNOSIS — Z01812 Encounter for preprocedural laboratory examination: Secondary | ICD-10-CM | POA: Insufficient documentation

## 2014-11-17 DIAGNOSIS — Z01818 Encounter for other preprocedural examination: Secondary | ICD-10-CM | POA: Insufficient documentation

## 2014-11-17 DIAGNOSIS — I10 Essential (primary) hypertension: Secondary | ICD-10-CM | POA: Diagnosis not present

## 2014-11-17 DIAGNOSIS — E785 Hyperlipidemia, unspecified: Secondary | ICD-10-CM | POA: Diagnosis not present

## 2014-11-17 HISTORY — DX: Personal history of thrombophlebitis: Z86.72

## 2014-11-17 HISTORY — DX: Anemia, unspecified: D64.9

## 2014-11-17 LAB — CBC
HEMATOCRIT: 44.8 % (ref 36.0–46.0)
HEMOGLOBIN: 14.7 g/dL (ref 12.0–15.0)
MCH: 31.3 pg (ref 26.0–34.0)
MCHC: 32.8 g/dL (ref 30.0–36.0)
MCV: 95.5 fL (ref 78.0–100.0)
Platelets: 278 10*3/uL (ref 150–400)
RBC: 4.69 MIL/uL (ref 3.87–5.11)
RDW: 13.4 % (ref 11.5–15.5)
WBC: 5.9 10*3/uL (ref 4.0–10.5)

## 2014-11-17 LAB — BASIC METABOLIC PANEL
ANION GAP: 10 (ref 5–15)
BUN: 21 mg/dL — ABNORMAL HIGH (ref 6–20)
CHLORIDE: 105 mmol/L (ref 101–111)
CO2: 23 mmol/L (ref 22–32)
Calcium: 9.4 mg/dL (ref 8.9–10.3)
Creatinine, Ser: 1.19 mg/dL — ABNORMAL HIGH (ref 0.44–1.00)
GFR calc Af Amer: 54 mL/min — ABNORMAL LOW (ref >60)
GFR, EST NON AFRICAN AMERICAN: 46 mL/min — AB (ref >60)
Glucose, Bld: 146 mg/dL — ABNORMAL HIGH (ref 65–99)
POTASSIUM: 3.9 mmol/L (ref 3.5–5.1)
SODIUM: 138 mmol/L (ref 135–145)

## 2014-11-17 LAB — SURGICAL PCR SCREEN
MRSA, PCR: NEGATIVE
STAPHYLOCOCCUS AUREUS: POSITIVE — AB

## 2014-11-17 LAB — GLUCOSE, CAPILLARY: Glucose-Capillary: 124 mg/dL — ABNORMAL HIGH (ref 65–99)

## 2014-11-17 NOTE — Progress Notes (Signed)
Mupirocin Ointment Rx called into [facility] in [place] for positive PCR of Staph. Pt notified and voiced understanding.

## 2014-11-17 NOTE — Progress Notes (Signed)
Pt denies cardiac history, chest pain or sob.   Pt is diabetic, type 2. Today's CBG was 124. Last Hgb A1C was 8.5 on 11/10/14.

## 2014-11-17 NOTE — Pre-Procedure Instructions (Signed)
Monica BalboaMary W Ray  11/17/2014     Your procedure is scheduled on Thursday, November 25, 2014 at 7:30 AM.   [facility] Office at 5:30 AM.   Call this number if you have problems the morning of surgery: [phone]   Any questions prior to day of surgery, please call [phone] between 8 & 4 PM.   Remember:  Do not eat food or drink liquids after midnight Wednesday, 11/24/14.  Take these medicines the morning of surgery with A SIP OF WATER: Gabapentin (Neurontin)  Stop Fish Oil 7 days prior to surgery  How to Manage Your Diabetes Before Surgery   Why is it important to control my blood sugar before and after surgery?   Improving blood sugar levels before and after surgery helps healing and can limit problems.  A way of improving blood sugar control is eating a healthy diet by:  - Eating less sugar and carbohydrates  - Increasing activity/exercise  - Talk with your doctor about reaching your blood sugar goals  High blood sugars (greater than 180 mg/dL) can raise your risk of infections and slow down your recovery so you will need to focus on controlling your diabetes during the weeks before surgery.  Make sure that the doctor who takes care of your diabetes knows about your planned surgery including the date and location.  How do I manage my blood sugars before surgery?   Check your blood sugar at least 4 times a day, 2 days before surgery to make sure that they are not too high or low.   Check your blood sugar the morning of your surgery when you wake up and every 2 hours until you get to the Short-Stay unit.   Treat a low blood sugar (less than 70 mg/dL) with 1/2 cup of clear juice (cranberry or apple), 4 glucose tablets, OR glucose gel.   Recheck blood sugar in 15 minutes after treatment (to make sure it is greater than 70 mg/dL).  If blood sugar is not greater than 70 mg/dL on re-check, call [phone] for further  instructions.    Report your blood sugar to the Short-Stay nurse when you get to Short-Stay.  References:  [facility], 2007 "How to Manage your Diabetes Before and After Surgery".  What do I do about my diabetes medications?   Do not take oral diabetes medicines (pills) the morning of surgery.  THE NIGHT BEFORE SURGERY, take 44 units of Levemir Insulin.  Do not take bedtime dose of Humalog Insulin the night before surgery.    THE MORNING OF SURGERY, take 40 units of Levemir Insulin.    If your CBG is greater than 220 mg/dL, you may take 1/2 of your sliding scale (correction) dose of insulin.   Do not wear jewelry, make-up or nail polish.  Do not wear lotions, powders, or perfumes.  You may wear deodorant.  Do not shave 48 hours prior to surgery.    Do not bring valuables to the hospital.  [facility] is not responsible for any belongings or valuables.  Contacts, dentures or bridgework may not be worn into surgery.  Leave your suitcase in the car.  After surgery it may be brought to your room.  For patients admitted to the hospital, discharge time will be determined by your treatment team.     Special instructions:  [place] - Preparing for Surgery  Before surgery, you can play an important role.  Because skin is not sterile, your skin needs to be as free of germs as possible.  You can reduce the number of germs on you skin by washing with CHG (chlorahexidine gluconate) soap before surgery.  CHG is an antiseptic cleaner which kills germs and bonds with the skin to continue killing germs even after washing.  Please DO NOT use if you have an allergy to CHG or antibacterial soaps.  If your skin becomes reddened/irritated stop using the CHG and inform your nurse when you arrive at Short Stay.  Do not shave (including legs and underarms) for at least 48 hours prior to the first CHG shower.  You may shave your face.  Please follow these instructions  carefully:   1.  Shower with CHG Soap the night before surgery and the                                morning of Surgery.  2.  If you choose to wash your hair, wash your hair first as usual with your       normal shampoo.  3.  After you shampoo, rinse your hair and body thoroughly to remove the                      Shampoo.  4.  Use CHG as you would any other liquid soap.  You can apply chg directly       to the skin and wash gently with scrungie or a clean washcloth.  5.  Apply the CHG Soap to your body ONLY FROM THE NECK DOWN.        Do not use on open wounds or open sores.  Avoid contact with your eyes, ears, mouth and genitals (private parts).  Wash genitals (private parts) with your normal soap.  6.  Wash thoroughly, paying special attention to the area where your surgery        will be performed.  7.  Thoroughly rinse your body with warm water from the neck down.  8.  DO NOT shower/wash with your normal soap after using and rinsing off       the CHG Soap.  9.  Pat yourself dry with a clean towel.            10.  Wear clean pajamas.            11.  Place clean sheets on your bed the night of your first shower and do not        sleep with pets.  Day of Surgery  Do not apply any lotions the morning of surgery.  Please wear clean clothes to the hospital.  Please read over the following fact sheets that you were given. Pain Booklet, Coughing and Deep Breathing, MRSA Information and Surgical Site Infection Prevention

## 2014-11-18 NOTE — Progress Notes (Addendum)
Anesthesia Chart Review:  Pt is 69 year old female scheduled for L2-3, L3-4 lumbar laminectomy/ decompression microdiscectomy on 11/25/2014 with Dr. Lovell SheehanJenkins.   PCP is Maurine MinisterMiriam McLaughlin, [place], endocrinologist is Dr. Carlena SaxAnna Solum (both in care everywhere).   PMH includes: HTN, DM, hyperlipidemia, anemia. Never smoker. BMI 43.   Medications include: enalapril, lasix, levemir, humalog, pioglitazone, crestor.   Preoperative labs reviewed.  Glucose 146. Most recent hgbA1c was 8.5 on 11/10/14 (care everywhere).   EKG 07/06/2014: pending from PCP's office.   If EKG acceptable, I anticipate pt can proceed as scheduled.  Rica Mastngela Kabbe, FNP-BC [facility]/Anesthesiology Phone: [phone] 11/18/2014 12:53 PM  Addendum: Re[facility] tracing [facility] from 07/06/14. Interpretation was read as NSR, possible LAE, borderline ECG. When compared to ECG of 07/06/2014 09:56, QRS axis shifted right.   I think limb leads are reversed as aVR is upright and negative QRS in I and aVL. Precordial leads appear okay. Will plan to get another EKG on arrival for a better baseline. She has an old comparison [facility] (printed and on paper chart).  Velna Ochsllison Zelenak, PA-C [facility]/Anesthesiology Phone [phone] 11/22/2014 3:41 PM

## 2014-11-22 NOTE — Progress Notes (Signed)
Re- requested [facility]

## 2014-11-24 MED ORDER — VANCOMYCIN HCL 10 G IV SOLR
1500.0000 mg | INTRAVENOUS | Status: AC
  Administered 2014-11-25: 1500 mg via INTRAVENOUS
  Filled 2014-11-24: qty 1500

## 2014-11-24 NOTE — Progress Notes (Signed)
Pt called to verify insulin doses for tonight and in the AM. She voiced what was instructed.

## 2014-11-25 ENCOUNTER — Ambulatory Visit (HOSPITAL_COMMUNITY)
Admission: RE | Admit: 2014-11-25 | Discharge: 2014-11-26 | Disposition: A | Discharge status: Home / Self Care | Payer: Medicare Other | Source: Ambulatory Visit | Attending: Neurosurgery | Admitting: Neurosurgery

## 2014-11-25 ENCOUNTER — Ambulatory Visit (HOSPITAL_COMMUNITY): Payer: Medicare Other | Admitting: Anesthesiology

## 2014-11-25 ENCOUNTER — Ambulatory Visit (HOSPITAL_COMMUNITY): Payer: Medicare Other

## 2014-11-25 ENCOUNTER — Encounter (HOSPITAL_COMMUNITY)
Admission: RE | Disposition: A | Discharge status: Home / Self Care | Payer: Self-pay | Source: Ambulatory Visit | Attending: Neurosurgery

## 2014-11-25 ENCOUNTER — Encounter (HOSPITAL_COMMUNITY): Payer: Self-pay | Admitting: Anesthesiology

## 2014-11-25 ENCOUNTER — Ambulatory Visit (HOSPITAL_COMMUNITY): Payer: Medicare Other | Admitting: Emergency Medicine

## 2014-11-25 DIAGNOSIS — M48061 Spinal stenosis, lumbar region without neurogenic claudication: Secondary | ICD-10-CM | POA: Insufficient documentation

## 2014-11-25 DIAGNOSIS — G9519 Other vascular myelopathies: Secondary | ICD-10-CM | POA: Insufficient documentation

## 2014-11-25 DIAGNOSIS — I10 Essential (primary) hypertension: Secondary | ICD-10-CM | POA: Diagnosis not present

## 2014-11-25 DIAGNOSIS — M48062 Spinal stenosis, lumbar region with neurogenic claudication: Secondary | ICD-10-CM | POA: Diagnosis present

## 2014-11-25 DIAGNOSIS — Z419 Encounter for procedure for purposes other than remedying health state, unspecified: Secondary | ICD-10-CM

## 2014-11-25 DIAGNOSIS — Z794 Long term (current) use of insulin: Secondary | ICD-10-CM | POA: Diagnosis not present

## 2014-11-25 DIAGNOSIS — E11319 Type 2 diabetes mellitus with unspecified diabetic retinopathy without macular edema: Secondary | ICD-10-CM | POA: Insufficient documentation

## 2014-11-25 DIAGNOSIS — E785 Hyperlipidemia, unspecified: Secondary | ICD-10-CM | POA: Insufficient documentation

## 2014-11-25 DIAGNOSIS — Z79899 Other long term (current) drug therapy: Secondary | ICD-10-CM | POA: Diagnosis not present

## 2014-11-25 DIAGNOSIS — M5416 Radiculopathy, lumbar region: Secondary | ICD-10-CM | POA: Insufficient documentation

## 2014-11-25 DIAGNOSIS — Z8614 Personal history of Methicillin resistant Staphylococcus aureus infection: Secondary | ICD-10-CM | POA: Insufficient documentation

## 2014-11-25 DIAGNOSIS — M4806 Spinal stenosis, lumbar region: Secondary | ICD-10-CM | POA: Insufficient documentation

## 2014-11-25 HISTORY — PX: LUMBAR LAMINECTOMY/DECOMPRESSION MICRODISCECTOMY: SHX5026

## 2014-11-25 LAB — GLUCOSE, CAPILLARY
GLUCOSE-CAPILLARY: 108 mg/dL — AB (ref 65–99)
Glucose-Capillary: 114 mg/dL — ABNORMAL HIGH (ref 65–99)
Glucose-Capillary: 235 mg/dL — ABNORMAL HIGH (ref 65–99)
Glucose-Capillary: 234 mg/dL — ABNORMAL HIGH (ref 65–99)

## 2014-11-25 SURGERY — LUMBAR LAMINECTOMY/DECOMPRESSION MICRODISCECTOMY 2 LEVELS
Anesthesia: General

## 2014-11-25 MED ORDER — BACITRACIN ZINC 500 UNIT/GM EX OINT
TOPICAL_OINTMENT | CUTANEOUS | Status: DC | PRN
  Administered 2014-11-25: 1 via TOPICAL

## 2014-11-25 MED ORDER — EPHEDRINE SULFATE 50 MG/ML IJ SOLN
INTRAMUSCULAR | Status: AC
  Filled 2014-11-25: qty 1

## 2014-11-25 MED ORDER — THROMBIN 5000 UNITS EX SOLR
CUTANEOUS | Status: DC | PRN
  Administered 2014-11-25 (×2): 5000 [IU] via TOPICAL

## 2014-11-25 MED ORDER — MENTHOL 3 MG MT LOZG: 1.0000 | LOZENGE | OROMUCOSAL | Status: DC | PRN

## 2014-11-25 MED ORDER — HYDROMORPHONE HCL 1 MG/ML IJ SOLN
INTRAMUSCULAR | Status: AC
  Administered 2014-11-25: 0.5 mg via INTRAVENOUS
  Filled 2014-11-25: qty 1

## 2014-11-25 MED ORDER — BUPIVACAINE LIPOSOME 1.3 % IJ SUSP
20.0000 mL | INTRAMUSCULAR | Status: DC
  Filled 2014-11-25: qty 20

## 2014-11-25 MED ORDER — ENALAPRIL MALEATE 20 MG PO TABS
20.0000 mg | ORAL_TABLET | Freq: Every day | ORAL | Status: DC
  Administered 2014-11-25 (×2): 20 mg via ORAL
  Filled 2014-11-25 (×3): qty 1

## 2014-11-25 MED ORDER — INSULIN DETEMIR 100 UNIT/ML ~~LOC~~ SOLN
50.0000 [IU] | Freq: Every day | SUBCUTANEOUS | Status: DC
Start: 2014-11-25 — End: 2014-11-25
  Filled 2014-11-25: qty 0.5

## 2014-11-25 MED ORDER — DEXAMETHASONE SODIUM PHOSPHATE 4 MG/ML IJ SOLN
INTRAMUSCULAR | Status: DC | PRN
  Administered 2014-11-25: 8 mg via INTRAVENOUS

## 2014-11-25 MED ORDER — GLYCOPYRROLATE 0.2 MG/ML IJ SOLN
INTRAMUSCULAR | Status: AC
  Filled 2014-11-25: qty 2

## 2014-11-25 MED ORDER — INSULIN DETEMIR 100 UNIT/ML ~~LOC~~ SOLN
50.0000 [IU] | Freq: Every day | SUBCUTANEOUS | Status: DC
  Filled 2014-11-25: qty 0.5

## 2014-11-25 MED ORDER — ENALAPRIL MALEATE 20 MG PO TABS
20.0000 mg | ORAL_TABLET | Freq: Once | ORAL | Status: DC
  Filled 2014-11-25: qty 1

## 2014-11-25 MED ORDER — ACETAMINOPHEN 10 MG/ML IV SOLN
INTRAVENOUS | Status: DC | PRN
  Administered 2014-11-25: 1000 mg via INTRAVENOUS

## 2014-11-25 MED ORDER — ROCURONIUM BROMIDE 50 MG/5ML IV SOLN
INTRAVENOUS | Status: AC
  Filled 2014-11-25: qty 1

## 2014-11-25 MED ORDER — MORPHINE SULFATE (PF) 2 MG/ML IV SOLN: 1.0000 mg | INTRAVENOUS | Status: DC | PRN

## 2014-11-25 MED ORDER — ALUM & MAG HYDROXIDE-SIMETH 200-200-20 MG/5ML PO SUSP: 30.0000 mL | Freq: Four times a day (QID) | ORAL | Status: DC | PRN

## 2014-11-25 MED ORDER — MIDAZOLAM HCL 5 MG/5ML IJ SOLN
INTRAMUSCULAR | Status: DC | PRN
Start: 2014-11-25 — End: 2014-11-25
  Administered 2014-11-25: 1 mg via INTRAVENOUS

## 2014-11-25 MED ORDER — ONDANSETRON HCL 4 MG/2ML IJ SOLN: 4.0000 mg | INTRAMUSCULAR | Status: DC | PRN

## 2014-11-25 MED ORDER — INSULIN ASPART 100 UNIT/ML ~~LOC~~ SOLN: 0.0000 [IU] | SUBCUTANEOUS | Status: DC

## 2014-11-25 MED ORDER — HYDROCODONE-ACETAMINOPHEN 5-325 MG PO TABS
1.0000 | ORAL_TABLET | ORAL | Status: DC | PRN
  Administered 2014-11-25 (×2): 2 via ORAL
  Administered 2014-11-25: 1 via ORAL
  Filled 2014-11-25 (×2): qty 2

## 2014-11-25 MED ORDER — PHENYLEPHRINE 40 MCG/ML (10ML) SYRINGE FOR IV PUSH (FOR BLOOD PRESSURE SUPPORT)
PREFILLED_SYRINGE | INTRAVENOUS | Status: AC
  Filled 2014-11-25: qty 10

## 2014-11-25 MED ORDER — MIDAZOLAM HCL 2 MG/2ML IJ SOLN
INTRAMUSCULAR | Status: AC
  Filled 2014-11-25: qty 4

## 2014-11-25 MED ORDER — PROPOFOL 10 MG/ML IV BOLUS
INTRAVENOUS | Status: DC | PRN
  Administered 2014-11-25: 200 mg via INTRAVENOUS

## 2014-11-25 MED ORDER — INSULIN DETEMIR 100 UNIT/ML ~~LOC~~ SOLN
64.0000 [IU] | Freq: Every day | SUBCUTANEOUS | Status: DC
  Administered 2014-11-25: 64 [IU] via SUBCUTANEOUS
  Filled 2014-11-25 (×2): qty 0.64

## 2014-11-25 MED ORDER — LIDOCAINE HCL (CARDIAC) 20 MG/ML IV SOLN
INTRAVENOUS | Status: DC | PRN
  Administered 2014-11-25: 100 mg via INTRAVENOUS

## 2014-11-25 MED ORDER — ROCURONIUM BROMIDE 100 MG/10ML IV SOLN
INTRAVENOUS | Status: DC | PRN
  Administered 2014-11-25: 50 mg via INTRAVENOUS

## 2014-11-25 MED ORDER — HYDROMORPHONE HCL 2 MG PO TABS: 4.0000 mg | ORAL_TABLET | ORAL | Status: DC | PRN

## 2014-11-25 MED ORDER — PROPOFOL 10 MG/ML IV BOLUS
INTRAVENOUS | Status: AC
  Filled 2014-11-25: qty 20

## 2014-11-25 MED ORDER — NEOSTIGMINE METHYLSULFATE 10 MG/10ML IV SOLN
INTRAVENOUS | Status: DC | PRN
  Administered 2014-11-25: 3 mg via INTRAVENOUS

## 2014-11-25 MED ORDER — ONDANSETRON HCL 4 MG/2ML IJ SOLN: 4.0000 mg | Freq: Once | INTRAMUSCULAR | Status: DC | PRN

## 2014-11-25 MED ORDER — HEMOSTATIC AGENTS (NO CHARGE) OPTIME
TOPICAL | Status: DC | PRN
  Administered 2014-11-25: 1 via TOPICAL

## 2014-11-25 MED ORDER — BUPIVACAINE-EPINEPHRINE (PF) 0.5% -1:200000 IJ SOLN
INTRAMUSCULAR | Status: DC | PRN
  Administered 2014-11-25: 10 mL via PERINEURAL

## 2014-11-25 MED ORDER — DIAZEPAM 5 MG PO TABS
5.0000 mg | ORAL_TABLET | Freq: Four times a day (QID) | ORAL | Status: DC | PRN
  Administered 2014-11-25 (×2): 5 mg via ORAL
  Filled 2014-11-25: qty 1

## 2014-11-25 MED ORDER — LACTATED RINGERS IV SOLN: INTRAVENOUS | Status: DC

## 2014-11-25 MED ORDER — GABAPENTIN 300 MG PO CAPS
300.0000 mg | ORAL_CAPSULE | Freq: Every day | ORAL | Status: DC
  Administered 2014-11-25: 300 mg via ORAL
  Filled 2014-11-25: qty 1

## 2014-11-25 MED ORDER — FENTANYL CITRATE (PF) 100 MCG/2ML IJ SOLN
INTRAMUSCULAR | Status: DC | PRN
  Administered 2014-11-25 (×2): 100 ug via INTRAVENOUS
  Administered 2014-11-25: 50 ug via INTRAVENOUS
  Administered 2014-11-25: 100 ug via INTRAVENOUS

## 2014-11-25 MED ORDER — BACITRACIN 50000 UNITS IM SOLR
INTRAMUSCULAR | Status: DC | PRN
  Administered 2014-11-25 (×2)

## 2014-11-25 MED ORDER — INSULIN ASPART 100 UNIT/ML ~~LOC~~ SOLN
0.0000 [IU] | Freq: Three times a day (TID) | SUBCUTANEOUS | Status: DC
Start: 2014-11-25 — End: 2014-11-26
  Administered 2014-11-25: 7 [IU] via SUBCUTANEOUS
  Administered 2014-11-26: 4 [IU] via SUBCUTANEOUS

## 2014-11-25 MED ORDER — LACTATED RINGERS IV SOLN
INTRAVENOUS | Status: DC | PRN
  Administered 2014-11-25 (×3): via INTRAVENOUS

## 2014-11-25 MED ORDER — FUROSEMIDE 20 MG PO TABS
20.0000 mg | ORAL_TABLET | Freq: Two times a day (BID) | ORAL | Status: DC
Start: 2014-11-25 — End: 2014-11-26

## 2014-11-25 MED ORDER — ACETAMINOPHEN 10 MG/ML IV SOLN
INTRAVENOUS | Status: AC
  Filled 2014-11-25: qty 100

## 2014-11-25 MED ORDER — INSULIN DETEMIR 100 UNIT/ML ~~LOC~~ SOLN: 50.0000 [IU] | Freq: Two times a day (BID) | SUBCUTANEOUS | Status: DC

## 2014-11-25 MED ORDER — DIAZEPAM 5 MG PO TABS
ORAL_TABLET | ORAL | Status: AC
  Filled 2014-11-25: qty 1

## 2014-11-25 MED ORDER — THROMBIN 5000 UNITS EX SOLR: CUTANEOUS | Status: DC | PRN

## 2014-11-25 MED ORDER — ONDANSETRON HCL 4 MG/2ML IJ SOLN
INTRAMUSCULAR | Status: AC
  Filled 2014-11-25: qty 2

## 2014-11-25 MED ORDER — GLYCOPYRROLATE 0.2 MG/ML IJ SOLN
INTRAMUSCULAR | Status: DC | PRN
  Administered 2014-11-25: 0.4 mg via INTRAVENOUS

## 2014-11-25 MED ORDER — FENTANYL CITRATE (PF) 250 MCG/5ML IJ SOLN
INTRAMUSCULAR | Status: AC
  Filled 2014-11-25: qty 5

## 2014-11-25 MED ORDER — VANCOMYCIN HCL IN DEXTROSE 1-5 GM/200ML-% IV SOLN
1000.0000 mg | Freq: Once | INTRAVENOUS | Status: AC
  Administered 2014-11-25: 1000 mg via INTRAVENOUS
  Filled 2014-11-25: qty 200

## 2014-11-25 MED ORDER — SODIUM CHLORIDE 0.9 % IJ SOLN
INTRAMUSCULAR | Status: AC
  Filled 2014-11-25: qty 10

## 2014-11-25 MED ORDER — ROSUVASTATIN CALCIUM 20 MG PO TABS
20.0000 mg | ORAL_TABLET | Freq: Every day | ORAL | Status: DC
  Administered 2014-11-25: 20 mg via ORAL
  Filled 2014-11-25: qty 1

## 2014-11-25 MED ORDER — HYDROMORPHONE HCL 1 MG/ML IJ SOLN
0.5000 mg | INTRAMUSCULAR | Status: DC | PRN
  Administered 2014-11-25 (×3): 0.5 mg via INTRAVENOUS

## 2014-11-25 MED ORDER — HYDROCODONE-ACETAMINOPHEN 5-325 MG PO TABS
ORAL_TABLET | ORAL | Status: AC
  Filled 2014-11-25: qty 1

## 2014-11-25 MED ORDER — INSULIN DETEMIR 100 UNIT/ML ~~LOC~~ SOLN
64.0000 [IU] | Freq: Every day | SUBCUTANEOUS | Status: DC
  Filled 2014-11-25: qty 0.64

## 2014-11-25 MED ORDER — ONDANSETRON HCL 4 MG/2ML IJ SOLN
INTRAMUSCULAR | Status: DC | PRN
  Administered 2014-11-25: 4 mg via INTRAVENOUS

## 2014-11-25 MED ORDER — BISACODYL 10 MG RE SUPP: 10.0000 mg | Freq: Every day | RECTAL | Status: DC | PRN

## 2014-11-25 MED ORDER — LIDOCAINE HCL 4 % MT SOLN
OROMUCOSAL | Status: DC | PRN
  Administered 2014-11-25: 4 mL via TOPICAL

## 2014-11-25 MED ORDER — NEOSTIGMINE METHYLSULFATE 10 MG/10ML IV SOLN
INTRAVENOUS | Status: AC
  Filled 2014-11-25: qty 1

## 2014-11-25 MED ORDER — DOCUSATE SODIUM 100 MG PO CAPS
100.0000 mg | ORAL_CAPSULE | Freq: Two times a day (BID) | ORAL | Status: DC
  Administered 2014-11-25 (×2): 100 mg via ORAL
  Filled 2014-11-25 (×2): qty 1

## 2014-11-25 MED ORDER — PIOGLITAZONE HCL 45 MG PO TABS
45.0000 mg | ORAL_TABLET | Freq: Every day | ORAL | Status: DC
  Filled 2014-11-25: qty 1

## 2014-11-25 MED ORDER — 0.9 % SODIUM CHLORIDE (POUR BTL) OPTIME
TOPICAL | Status: DC | PRN
  Administered 2014-11-25: 1000 mL

## 2014-11-25 MED ORDER — LIDOCAINE HCL (CARDIAC) 20 MG/ML IV SOLN
INTRAVENOUS | Status: AC
  Filled 2014-11-25: qty 5

## 2014-11-25 MED ORDER — PHENOL 1.4 % MT LIQD
1.0000 | OROMUCOSAL | Status: DC | PRN
Start: 2014-11-25 — End: 2014-11-26

## 2014-11-25 MED ORDER — PHENYLEPHRINE HCL 10 MG/ML IJ SOLN
INTRAMUSCULAR | Status: DC | PRN
  Administered 2014-11-25 (×4): 80 ug via INTRAVENOUS

## 2014-11-25 SURGICAL SUPPLY — 55 items
BAG DECANTER FOR FLEXI CONT (MISCELLANEOUS) ×3 IMPLANT
BENZOIN TINCTURE PRP APPL 2/3 (GAUZE/BANDAGES/DRESSINGS) ×3 IMPLANT
BLADE CLIPPER SURG (BLADE) IMPLANT
BRUSH SCRUB EZ PLAIN DRY (MISCELLANEOUS) ×3 IMPLANT
BUR MATCHSTICK NEURO 3.0 LAGG (BURR) ×3 IMPLANT
BUR PRECISION FLUTE 6.0 (BURR) ×3 IMPLANT
CANISTER SUCT 3000ML PPV (MISCELLANEOUS) ×3 IMPLANT
CLOSURE WOUND 1/2 X4 (GAUZE/BANDAGES/DRESSINGS) ×1
DRAPE LAPAROTOMY 100X72X124 (DRAPES) ×3 IMPLANT
DRAPE MICROSCOPE LEICA (MISCELLANEOUS) ×3 IMPLANT
DRAPE POUCH INSTRU U-SHP 10X18 (DRAPES) ×3 IMPLANT
DRAPE SURG 17X23 STRL (DRAPES) ×12 IMPLANT
DURASEAL APPLICATOR TIP (TIP) ×3 IMPLANT
DURASEAL SPINE SEALANT 3ML (MISCELLANEOUS) ×3 IMPLANT
ELECT BLADE 4.0 EZ CLEAN MEGAD (MISCELLANEOUS) ×3
ELECT REM PT RETURN 9FT ADLT (ELECTROSURGICAL) ×3
ELECTRODE BLDE 4.0 EZ CLN MEGD (MISCELLANEOUS) ×1 IMPLANT
ELECTRODE REM PT RTRN 9FT ADLT (ELECTROSURGICAL) ×1 IMPLANT
GAUZE SPONGE 4X4 12PLY STRL (GAUZE/BANDAGES/DRESSINGS) ×3 IMPLANT
GAUZE SPONGE 4X4 16PLY XRAY LF (GAUZE/BANDAGES/DRESSINGS) IMPLANT
GLOVE BIO SURGEON STRL SZ8 (GLOVE) ×6 IMPLANT
GLOVE BIO SURGEON STRL SZ8.5 (GLOVE) ×3 IMPLANT
GLOVE ECLIPSE 7.0 STRL STRAW (GLOVE) ×6 IMPLANT
GLOVE ECLIPSE 7.5 STRL STRAW (GLOVE) ×9 IMPLANT
GLOVE EXAM NITRILE LRG STRL (GLOVE) IMPLANT
GLOVE EXAM NITRILE MD LF STRL (GLOVE) IMPLANT
GLOVE EXAM NITRILE XL STR (GLOVE) IMPLANT
GLOVE EXAM NITRILE XS STR PU (GLOVE) IMPLANT
GLOVE INDICATOR 7.0 STRL GRN (GLOVE) ×3 IMPLANT
GLOVE INDICATOR 7.5 STRL GRN (GLOVE) ×3 IMPLANT
GLOVE INDICATOR 8.0 STRL GRN (GLOVE) ×3 IMPLANT
GOWN STRL REUS W/ TWL LRG LVL3 (GOWN DISPOSABLE) ×1 IMPLANT
GOWN STRL REUS W/ TWL XL LVL3 (GOWN DISPOSABLE) ×3 IMPLANT
GOWN STRL REUS W/TWL 2XL LVL3 (GOWN DISPOSABLE) ×3 IMPLANT
GOWN STRL REUS W/TWL LRG LVL3 (GOWN DISPOSABLE) ×2
GOWN STRL REUS W/TWL XL LVL3 (GOWN DISPOSABLE) ×6
KIT BASIN OR (CUSTOM PROCEDURE TRAY) ×3 IMPLANT
KIT ROOM TURNOVER OR (KITS) ×3 IMPLANT
NEEDLE HYPO 21X1.5 SAFETY (NEEDLE) IMPLANT
NEEDLE HYPO 22GX1.5 SAFETY (NEEDLE) ×3 IMPLANT
NS IRRIG 1000ML POUR BTL (IV SOLUTION) ×3 IMPLANT
PACK LAMINECTOMY NEURO (CUSTOM PROCEDURE TRAY) ×3 IMPLANT
PAD ARMBOARD 7.5X6 YLW CONV (MISCELLANEOUS) ×9 IMPLANT
PATTIES SURGICAL .5 X1 (DISPOSABLE) IMPLANT
RUBBERBAND STERILE (MISCELLANEOUS) ×6 IMPLANT
SPONGE SURGIFOAM ABS GEL SZ50 (HEMOSTASIS) ×3 IMPLANT
STRIP CLOSURE SKIN 1/2X4 (GAUZE/BANDAGES/DRESSINGS) ×2 IMPLANT
SUT PROLENE 6 0 BV (SUTURE) ×6 IMPLANT
SUT PROLENE 6 0 C 1 30 (SUTURE) ×3 IMPLANT
SUT VIC AB 1 CT1 18XBRD ANBCTR (SUTURE) ×2 IMPLANT
SUT VIC AB 1 CT1 8-18 (SUTURE) ×4
SUT VIC AB 2-0 CP2 18 (SUTURE) ×6 IMPLANT
TOWEL OR 17X24 6PK STRL BLUE (TOWEL DISPOSABLE) ×3 IMPLANT
TOWEL OR 17X26 10 PK STRL BLUE (TOWEL DISPOSABLE) ×3 IMPLANT
WATER STERILE IRR 1000ML POUR (IV SOLUTION) ×3 IMPLANT

## 2014-11-25 NOTE — Plan of Care (Signed)
Problem: Consults Goal: Diagnosis - Spinal Surgery Outcome: Completed/Met Date Met:  11/25/14 Lumbar Laminectomy (Complex)

## 2014-11-25 NOTE — Transfer of Care (Signed)
Immediate Anesthesia Transfer of Care Note  Patient: Monica BalboaMary W Yeakel  Procedure(s) Performed: Procedure(s): LUMBAR LAMINECTOMY/DECOMPRESSION MICRODISCECTOMY LUMBAR TWO-THREE, LUMBAR THREE-FOUR  (N/A)  Patient Location: PACU  Anesthesia Type:General  Level of Consciousness: awake, alert , oriented, sedated and patient cooperative  Airway & Oxygen Therapy: Patient Spontanous Breathing and Patient connected to face mask oxygen  Post-op Assessment: Report given to RN, Post -op Vital signs reviewed and stable and Patient moving all extremities  Post vital signs: Reviewed and stable  Last Vitals:  Filed Vitals:   11/25/14 0629  BP: 174/89  Pulse: 75  Temp: 37.1 C  Resp: 20    Complications: No apparent anesthesia complications

## 2014-11-25 NOTE — Progress Notes (Signed)
0400 pt. Stated blood sugar was 70, not symptomatic. Pt. States she drank a sip of apple juice and rechecked blood sugar 15 minutes later.Stated it was 6298.

## 2014-11-25 NOTE — Op Note (Signed)
Brief history: The patient is a 69 year old white female who has complained of chronic back, buttock, and leg pain consistent with neurogenic claudication. She has failed medical management and was worked up with a lumbar MRI. This demonstrated multilevel degenerative changes with spinal stenosis most prominent at L2-3 and L3-4. I discussed the various treatment options with the patient including surgery. She has weighed the risks, benefits, and alternatives surgery and decided to proceed with an L2-3 and L3-4 laminectomy.  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 Delma OfficerJeff Buck Ray  Asst.: Dr. Marikay Alaravid Jones  Anesthesia: Gen. endotracheal  Estimated blood loss: 150 mL  Drains: None  Complications: None  Description of procedure: The patient was brought to the operating room by the anesthesia team. General endotracheal anesthesia was induced. The patient was turned to the prone position on the Wilson frame. The patient's lumbosacral region was then prepared with Betadine scrub and Betadine solution. Sterile drapes were applied.  I then injected the area to be incised with Marcaine with epinephrine solution. I then used a scalpel to make a linear midline incision over the L2-3 and L3-4 intervertebral disc space. I then used electrocautery to perform a bilateral  subperiosteal dissection exposing the spinous process and lamina of L2, L3 and L4. We obtained intraoperative radiograph to confirm our location. I then [facility] the [facility] ret[facility] for exposure. I incised the interspinous ligament at L1-2, L2-3, and L3-4 with the scalpel. I then used a Leksell rongeur to remove the spinous process of L2 and L3.  We then brought the operative microscope into the field. Under its  magnification and illumination we completed the microdissection. I used a high-speed drill to perform a laminotomy at L2 and L3 bilaterally. I then used a Kerrison punches to complete the L2 and L3 laminectomy and removed the ligamentum flavum at L1-2, L2-3 and L3-4. We then used microdissection to free up the thecal sac and the bilateral L2, L3 and L4 nerve root from the epidural tissue. I then used a Kerrison punch to perform a foraminotomy at about the bilateral L2, L3 and L4 nerve root. We inspected the intervertebral disc at L2-3 and L3-4 bilaterally. There was a calcified herniated disks at L3-4 on the left. It was quite adherent to the dura. Using microdissection where able to free it from the thecal sac and remove it with the pituitary forceps. In doing so we encountered a small durotomy which we closed with a figure-of-eight 6-0 Prolene suture.   I then palpated along the ventral surface of the thecal sac and along exit route of the bilateral L2, L3 and L4 nerve root and noted that the neural structures were well decompressed. This completed the decompression.  We then obtained hemostasis using bipolar electrocautery. We irrigated the wound out with bacitracin solution. We then removed the retractor. I placed DuraSeal over the sutured durotomy.  We then reapproximated the patient's thoracolumbar fascia with interrupted #1 Vicryl suture. We then reapproximated the patient's subcutaneous tissue with interrupted 2-0 Vicryl suture. We then reapproximated patient's skin with Steri-Strips and benzoin. The was then coated with bacitracin ointment. The drapes were removed. The patient was subsequently returned to the supine position where they were extubated by the anesthesia team. The patient was then transported to the postanesthesia care unit in stable condition. All sponge instrument  and needle counts were reportedly correct at the end of this case.

## 2014-11-25 NOTE — Progress Notes (Signed)
Subjective:  The patient is alert and pleasant. She denies headache. She looks well.  Objective: Vital signs in last 24 hours: Temp:  [97.5 F (36.4 C)-98.8 F (37.1 C)] 97.6 F (36.4 C) (10/20 1358) Pulse Rate:  [64-77] 65 (10/20 1358) Resp:  [13-22] 20 (10/20 1358) BP: (110-174)/(50-89) 151/74 mmHg (10/20 1358) SpO2:  [91 %-96 %] 96 % (10/20 1358) Weight:  [114.306 kg (252 lb)] 114.306 kg (252 lb) (10/20 0629)  Intake/Output from previous day:   Intake/Output this shift: Total I/O In: 2340 [P.O.:240; I.V.:2100] Out: 200 [Blood:200]  Physical exam the patient is alert and pleasant. She is moving her lower extremities well.  Lab Results: No results for input(s): WBC, HGB, HCT, PLT in the last 72 hours. BMET No results for input(s): NA, K, CL, CO2, GLUCOSE, BUN, CREATININE, CALCIUM in the last 72 hours.  Studies/Results: Dg Lumbar Spine 2-3 Views  11/25/2014  CLINICAL DATA:  Confirm location for L2 through L4 laminectomies. EXAM: LUMBAR SPINE - 2-3 VIEW COMPARISON:  Lumbar spine MRI 08/12/2014 FINDINGS: Spinal numbering as established on lumbar spine MRI comparison. Retractor and probe centered at the L4-5 disc level posteriorly. This interspace is fused on MRI and used to confirm agreement in level. No acute finding. These results were called by telephone at the time of interpretation on 11/25/2014 at 8:37 am to Dr. Tressie StalkerJEFFREY Iniko Robles , who verbally acknowledged these results. IMPRESSION: Spinal numbering as established on preoperative MRI 08/12/2014. Dorsal probe located at the L4-5 disc level. Electronically Signed   By: Marnee SpringJonathon  Watts M.D.   On: 11/25/2014 08:38    Assessment/Plan: The patient is doing well. We will keep her at bedrest with bathroom privileges until tomorrow.      Female Iafrate D 11/25/2014, 2:52 PM

## 2014-11-25 NOTE — Progress Notes (Signed)
Subjective:  The patient is alert and pleasant. She is in no apparent distress. She looks well. She denies headache.  Objective: Vital signs in last 24 hours: Temp:  [97.5 F (36.4 C)-98.8 F (37.1 C)] 97.5 F (36.4 C) (10/20 1100) Pulse Rate:  [75-77] 76 (10/20 1115) Resp:  [16-22] 16 (10/20 1115) BP: (110-174)/(59-89) 131/68 mmHg (10/20 1115) SpO2:  [93 %-96 %] 96 % (10/20 1115) Weight:  [114.306 kg (252 lb)] 114.306 kg (252 lb) (10/20 0629)  Intake/Output from previous day:   Intake/Output this shift: Total I/O In: 2100 [I.V.:2100] Out: 200 [Blood:200]  Physical exam the patient is alert and pleasant. She is moving her lower extremities well.  Lab Results: No results for input(s): WBC, HGB, HCT, PLT in the last 72 hours. BMET No results for input(s): NA, K, CL, CO2, GLUCOSE, BUN, CREATININE, CALCIUM in the last 72 hours.  Studies/Results: Dg Lumbar Spine 2-3 Views  11/25/2014  CLINICAL DATA:  Confirm location for L2 through L4 laminectomies. EXAM: LUMBAR SPINE - 2-3 VIEW COMPARISON:  Lumbar spine MRI 08/12/2014 FINDINGS: Spinal numbering as established on lumbar spine MRI comparison. Retractor and probe centered at the L4-5 disc level posteriorly. This interspace is fused on MRI and used to confirm agreement in level. No acute finding. These results were called by telephone at the time of interpretation on 11/25/2014 at 8:37 am to Dr. Tressie StalkerJEFFREY Yamato Kopf , who verbally acknowledged these results. IMPRESSION: Spinal numbering as established on preoperative MRI 08/12/2014. Dorsal probe located at the L4-5 disc level. Electronically Signed   By: Marnee SpringJonathon  Watts M.D.   On: 11/25/2014 08:38    Assessment/Plan: The patient is doing well. She can raise her head to 20-30. I spoke with her family.      Aniyla Harling D 11/25/2014, 11:19 AM

## 2014-11-25 NOTE — H&P (Signed)
Subjective: The patient is a 69 year old white female who has complained of back and leg pain consistent with neurogenic claudication. She was worked up with a lumbar MRI which demonstrated spinal stenosis most prominent at L2-3 and L3-4 as well as thoracic scoliosis degenerative changes etc. I discussed the various treatment options including a decompressive lumbar laminectomy. The patient has weighed the risks, benefits, and alternative surgery and decided to proceed with the operation.   Past Medical History  Diagnosis Date  . Hypertension   . Hyperlipidemia   . Diabetes mellitus, type 2 (HCC)   . DM retinopathy (HCC)   . Edema   . Mild allergic rhinitis   . Depression   . OA (osteoarthritis)     hands  . MRSA (methicillin resistant staph aureus) culture positive 03/2006  . Hx of phlebitis   . Anemia     before hysterectomy    Past Surgical History  Procedure Laterality Date  . Cholecystectomy  1989  . Appendectomy  1989  . Rotator cuff repair  08/2001    right  . Barium enema  1998    neg CT  . Adenoidectomy    . Abdominal hysterectomy      Allergies  Allergen Reactions  . Amoxicillin Hives    Break out in rash. Does not want penicillin.  . Fluconazole     REACTION: Itching  . Metformin Diarrhea, Nausea Only and Nausea And Vomiting  . Oxycodone-Acetaminophen     REACTION: Itching  . Simvastatin Other (See Comments)    Back pain  . Azithromycin Rash  . Clindamycin Hives and Rash    Social History  Substance Use Topics  . Smoking status: Never Smoker   . Smokeless tobacco: Never Used  . Alcohol Use: No    Family History  Problem Relation Age of Onset  . Cancer Mother 50    lung  . Diabetes Mother   . Alcohol abuse Mother   . Alcohol abuse Father   . Cancer Father     throat  . Aneurysm Father   . Diabetes Maternal Grandmother   . Aneurysm Paternal Grandmother    Prior to Admission medications   Medication Sig Start Date End Date Taking? Authorizing  Provider  Calcium Carbonate-Vit Ray-Min (CALCIUM 1200 PO) Take 1,200 mg by mouth daily.   Yes Historical Provider, MD  enalapril (VASOTEC) 20 MG tablet TAKE ONE (1) TABLET BY MOUTH TWO (2) TIMES DAILY 08/17/14  Yes Judy Pimple, MD  furosemide (LASIX) 20 MG tablet Take 1 tablet (20 mg total) by mouth 2 (two) times daily. 07/14/13  Yes Judy Pimple, MD  gabapentin (NEURONTIN) 300 MG capsule Take 300 mg by mouth at bedtime.    Yes Historical Provider, MD  insulin detemir (LEVEMIR) 100 UNIT/ML injection Inject 50-56 Units into the skin 2 (two) times daily. 50 units in the morning and 64 units in the evening   Yes Historical Provider, MD  insulin lispro (HUMALOG) 100 UNIT/ML injection Inject 20-26 Units into the skin 4 (four) times daily - after meals and at bedtime. Sliding scale 24 units during the day and 28 units at dinner   Yes Historical Provider, MD  meloxicam (MOBIC) 7.5 MG tablet TAKE ONE CAPSULE BY MOUTH DAILY 04/12/14  Yes Judy Pimple, MD  Omega-3 Fatty Acids (FISH OIL) 1200 MG CAPS Take 1,200 mg by mouth daily.   Yes Historical Provider, MD  pioglitazone (ACTOS) 45 MG tablet Take 45 mg by mouth daily.  Yes Historical Provider, MD  rosuvastatin (CRESTOR) 20 MG tablet Take 20 mg by mouth daily.   Yes Historical Provider, MD  VITAMIN Ray, CHOLECALCIFEROL, PO Take 1 tablet by mouth daily.    Yes Historical Provider, MD     Review of Systems  Positive ROS: As above  All other systems have been reviewed and were otherwise negative with the exception of those mentioned in the HPI and as above.  Objective: Vital signs in last 24 hours: Temp:  [98.8 F (37.1 C)] 98.8 F (37.1 C) (10/20 0629) Pulse Rate:  [75] 75 (10/20 0629) Resp:  [20] 20 (10/20 0629) BP: (174)/(89) 174/89 mmHg (10/20 0629) SpO2:  [93 %] 93 % (10/20 0629) Weight:  [114.306 kg (252 lb)] 114.306 kg (252 lb) (10/20 0629)  General Appearance: Alert, cooperative, no distress, Head: Normocephalic, without obvious abnormality,  atraumatic Eyes: PERRL, conjunctiva/corneas clear, EOM's intact,    Ears: Normal  Throat: Normal  Neck: Supple, symmetrical, trachea midline, no adenopathy; thyroid: No enlargement/tenderness/nodules; no carotid bruit or JVD Back: Symmetric, no curvature, ROM normal, no CVA tenderness Lungs: Clear to auscultation bilaterally, respirations unlabored Heart: Regular rate and rhythm, no murmur, rub or gallop Abdomen: Soft, non-tender,, no masses, no organomegaly Extremities: Extremities normal, atraumatic, no cyanosis or edema Pulses: 2+ and symmetric all extremities Skin: Skin color, texture, turgor normal, no rashes or lesions  NEUROLOGIC:   Mental status: alert and oriented, no aphasia, good attention span, Fund of knowledge/ memory ok Motor Exam - grossly normal Sensory Exam - grossly normal Reflexes: Hyperreflexia Coordination - grossly normal Gait - grossly normal Balance - grossly normal Cranial Nerves: I: smell Not tested  II: visual acuity  OS: Normal  OD: Normal   II: visual fields Full to confrontation  II: pupils Equal, round, reactive to light  III,VII: ptosis None  III,IV,VI: extraocular muscles  Full ROM  V: mastication Normal  V: facial light touch sensation  Normal  V,VII: corneal reflex  Present  VII: facial muscle function - upper  Normal  VII: facial muscle function - lower Normal  VIII: hearing Not tested  IX: soft palate elevation  Normal  IX,X: gag reflex Present  XI: trapezius strength  5/5  XI: sternocleidomastoid strength 5/5  XI: neck flexion strength  5/5  XII: tongue strength  Normal    Data Review Lab Results  Component Value Date   WBC 5.9 11/17/2014   HGB 14.7 11/17/2014   HCT 44.8 11/17/2014   MCV 95.5 11/17/2014   PLT 278 11/17/2014   Lab Results  Component Value Date   NA 138 11/17/2014   K 3.9 11/17/2014   CL 105 11/17/2014   CO2 23 11/17/2014   BUN 21* 11/17/2014   CREATININE 1.19* 11/17/2014   GLUCOSE 146* 11/17/2014   No  results found for: INR, PROTIME  Assessment/Plan: 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 We have discussed the various treatment options including surgery. I have described the surgical treatment option of an L2-3 and L3-4 laminectomy and foraminotomy. I have shown them surgical models. We have discussed the risks, benefits, alternatives, and likelihood of achieving goals with surgery. I have answered all the patient's questions. She has decided proceed with surgery.   Monica Ray 11/25/2014 7:25 AM

## 2014-11-25 NOTE — Anesthesia Postprocedure Evaluation (Signed)
  Anesthesia Post-op Note  Patient: Monica Ray  Procedure(s) Performed: Procedure(s): LUMBAR LAMINECTOMY/DECOMPRESSION MICRODISCECTOMY LUMBAR TWO-THREE, LUMBAR THREE-FOUR  (N/A)  Patient Location: PACU  Anesthesia Type:General  Level of Consciousness: awake, oriented, sedated and patient cooperative  Airway and Oxygen Therapy: Patient Spontanous Breathing  Post-op Pain: mild  Post-op Assessment: Post-op Vital signs reviewed, Patient's Cardiovascular Status Stable, Respiratory Function Stable, Patent Airway, No signs of Nausea or vomiting and Pain level controlled LLE Motor Response: Purposeful movement, Responds to commands   RLE Motor Response: Purposeful movement, Responds to commands        Post-op Vital Signs: stable  Last Vitals:  Filed Vitals:   11/25/14 1115  BP: 131/68  Pulse: 76  Temp:   Resp: 16    Complications: No apparent anesthesia complications

## 2014-11-25 NOTE — Anesthesia Procedure Notes (Signed)
Procedure Name: Intubation Date/Time: 11/25/2014 7:36 AM Performed by: Ferol LuzMCMILLEN, Terrelle Ruffolo L Pre-anesthesia Checklist: Emergency Drugs available, Patient identified, Suction available, Patient being monitored and Timeout performed Patient Re-evaluated:Patient Re-evaluated prior to inductionOxygen Delivery Method: Circle system utilized Preoxygenation: Pre-oxygenation with 100% oxygen Intubation Type: IV induction Ventilation: Mask ventilation without difficulty Laryngoscope Size: Mac and 3 Grade View: Grade II Tube type: Oral Tube size: 7.5 mm Number of attempts: 1 Airway Equipment and Method: Stylet Placement Confirmation: ETT inserted through vocal cords under direct vision,  breath sounds checked- equal and bilateral and positive ETCO2 Secured at: 20 cm Tube secured with: Tape Dental Injury: Teeth and Oropharynx as per pre-operative assessment

## 2014-11-25 NOTE — Anesthesia Preprocedure Evaluation (Signed)
Anesthesia Evaluation  Patient identified by MRN, date of birth, ID band Patient awake    Reviewed: Allergy & Precautions, NPO status , Patient's Chart, lab work & pertinent test results  Airway Mallampati: II  TM Distance: >3 FB     Dental   Pulmonary    Pulmonary exam normal        Cardiovascular hypertension, Normal cardiovascular exam     Neuro/Psych Depression    GI/Hepatic GERD  ,  Endo/Other  diabetes, Type 2, Insulin Dependent  Renal/GU      Musculoskeletal  (+) Arthritis ,   Abdominal   Peds  Hematology   Anesthesia Other Findings   Reproductive/Obstetrics                             Anesthesia Physical Anesthesia Plan  ASA: III  Anesthesia Plan: General   Post-op Pain Management:    Induction: Intravenous  Airway Management Planned: Oral ETT  Additional Equipment:   Intra-op Plan:   Post-operative Plan: Extubation in OR  Informed Consent: I have reviewed the patients History and Physical, chart, labs and discussed the procedure including the risks, benefits and alternatives for the proposed anesthesia with the patient or authorized representative who has indicated his/her understanding and acceptance.     Plan Discussed with: CRNA, Anesthesiologist and Surgeon  Anesthesia Plan Comments:         Anesthesia Quick Evaluation

## 2014-11-25 NOTE — Progress Notes (Signed)
ANTIBIOTIC CONSULT NOTE - INITIAL  Pharmacy Consult for vancomycin Indication: surgical prophylaxis  Allergies  Allergen Reactions  . Amoxicillin Hives    Break out in rash. Does not want penicillin.  . Fluconazole     REACTION: Itching  . Metformin Diarrhea, Nausea Only and Nausea And Vomiting  . Oxycodone-Acetaminophen     REACTION: Itching  . Simvastatin Other (See Comments)    Back pain  . Azithromycin Rash  . Clindamycin Hives and Rash    Patient Measurements: Weight: 252 lb (114.306 kg)  Vital Signs: Temp: 97.6 F (36.4 C) (10/20 1358) Temp Source: Oral (10/20 0629) BP: 151/74 mmHg (10/20 1358) Pulse Rate: 65 (10/20 1358) Intake/Output from previous day:   Intake/Output from this shift: Total I/O In: 2100 [I.V.:2100] Out: 200 [Blood:200]  Labs: No results for input(s): WBC, HGB, PLT, LABCREA, CREATININE in the last 72 hours. Estimated Creatinine Clearance: 56.9 mL/min (by C-G formula based on Cr of 1.19). No results for input(s): VANCOTROUGH, VANCOPEAK, VANCORANDOM, GENTTROUGH, GENTPEAK, GENTRANDOM, TOBRATROUGH, TOBRAPEAK, TOBRARND, AMIKACINPEAK, AMIKACINTROU, AMIKACIN in the last 72 hours.   Microbiology: Recent Results (from the past 720 hour(s))  Surgical pcr screen     Status: Abnormal   Collection Time: 11/17/14  3:24 PM  Result Value Ref Range Status   MRSA, PCR NEGATIVE NEGATIVE Final   Staphylococcus aureus POSITIVE (A) NEGATIVE Final    Comment:        The Xpert SA Assay (FDA approved for NASAL specimens in patients over 67 years of age), is one component of a comprehensive surveillance program.  Test performance has been validated by [facility] for patients greater than or equal to 67 year old. It is not intended to diagnose infection nor to guide or monitor treatment.     Assessment: [age] YOF s/p L2 and L3 laminectomy for decompression. Vancomycin for surgical prophylaxis. No drain in place. Pre-op SCr 1.19 on 10/12, CrCl  ~55-1160mL/min. No fever noted.  Pre-op dose of vancomycin 1g IV given at 0715 this morning.  Plan:  -vancomycin 1g IV x1 to be given at 1830 tonight -pharmacy to sign off as future doses are not needed since no drain in place  Latricia Cerrito D. Jakirah Zaun, PharmD, BCPS Clinical Pharmacist Pager: [phone] 11/25/2014 2:11 PM

## 2014-11-26 ENCOUNTER — Encounter (HOSPITAL_COMMUNITY): Payer: Self-pay | Admitting: Neurosurgery

## 2014-11-26 DIAGNOSIS — M4806 Spinal stenosis, lumbar region: Secondary | ICD-10-CM | POA: Diagnosis not present

## 2014-11-26 LAB — GLUCOSE, CAPILLARY: Glucose-Capillary: 176 mg/dL — ABNORMAL HIGH (ref 65–99)

## 2014-11-26 MED ORDER — CYCLOBENZAPRINE HCL 10 MG PO TABS: 10.0000 mg | ORAL_TABLET | Freq: Three times a day (TID) | ORAL | Status: DC | PRN

## 2014-11-26 MED ORDER — HYDROMORPHONE HCL 4 MG PO TABS: 4.0000 mg | ORAL_TABLET | ORAL | Status: DC | PRN

## 2014-11-26 MED ORDER — DOCUSATE SODIUM 100 MG PO CAPS: 100.0000 mg | ORAL_CAPSULE | Freq: Two times a day (BID) | ORAL | Status: DC

## 2014-11-26 NOTE — Progress Notes (Signed)
Patient alert and oriented, mae's well, voiding adequate amount of urine, swallowing without difficulty, no c/o pain. Patient discharged home with family. Script and discharged instructions given to patient. Patient and family stated understanding of d/c instructions given and has an appointment with MD. 

## 2014-11-26 NOTE — Discharge Summary (Signed)
Physician Discharge Summary  Patient ID: Monica BalboaMary W Swor MRN: [id number] DOB/AGE: 67/02/1945 67 y.o.  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  The patient's postoperative course was unremarkable. On postoperative day #1 the patient requested discharge to home. The patient, and her husband, or given oral and written discharge instructions. All their questions were answered.  Consults: None Significant Diagnostic Studies: None Treatments: L2-3 and L3-4 laminectomy using microdissection Discharge Exam: Blood pressure 134/51, pulse 84, temperature 98.1 F (36.7 C), temperature source Oral, resp. rate 20, weight 114.306 kg (252 lb), SpO2 93 %. The patient is alert and pleasant. Her dressing is clean and dry. Her strength is normal in her lower extremities.  Disposition: Home  Discharge Instructions    Call MD for:  difficulty breathing, headache or visual disturbances    Complete by:  As directed      Call MD for:  extreme fatigue    Complete by:  As directed      Call MD for:  hives    Complete by:  As directed      Call MD for:  persistant dizziness or light-headedness    Complete by:  As directed      Call MD for:  persistant nausea and vomiting    Complete by:  As directed      Call MD for:  redness, tenderness, or signs of infection (pain, swelling, redness, odor or green/yellow discharge around incision site)    Complete by:  As directed      Call MD for:  severe uncontrolled pain    Complete by:  As directed      Call MD for:  temperature >100.4    Complete by:  As directed      Diet - low sodium heart healthy    Complete by:  As  directed      Discharge instructions    Complete by:  As directed   Call [phone] for a followup appointment. Take a stool softener while you are using pain medications.     Driving Restrictions    Complete by:  As directed   Do not drive for 2 weeks.     Increase activity slowly    Complete by:  As directed      Lifting restrictions    Complete by:  As directed   Do not lift more than 5 pounds. No excessive bending or twisting.     May shower / Bathe    Complete by:  As directed   He may shower after the pain she is removed 3 days after surgery. Leave the incision alone.     Remove dressing in 48 hours    Complete by:  As directed   Your stitches are under the scan and will dissolve by themselves. The Steri-Strips will fall off after you take a few showers. Do not rub back or pick at the wound, Leave the wound alone.            Medication List    TAKE these medications        CALCIUM 1200 PO  Take 1,200 mg by mouth daily.     cyclobenzaprine 10 MG tablet  Commonly known as:  FLEXERIL  Take 1 tablet (10 mg total) by mouth 3 (three) times daily as needed for muscle spasms.     docusate sodium 100 MG capsule  Commonly known as:  COLACE  Take 1 capsule (100 mg total) by mouth 2 (two) times daily.     enalapril 20 MG tablet  Commonly known as:  VASOTEC  TAKE ONE (1) TABLET BY MOUTH TWO (2) TIMES DAILY     Fish Oil 1200 MG Caps  Take 1,200 mg by mouth daily.     furosemide 20 MG tablet  Commonly known as:  LASIX  Take 1 tablet (20 mg total) by mouth 2 (two) times daily.     gabapentin 300 MG capsule  Commonly known as:  NEURONTIN  Take 300 mg by mouth at bedtime.     HYDROmorphone 4 MG tablet  Commonly known as:  DILAUDID  Take 1 tablet (4 mg total) by mouth every 4 (four) hours as needed for severe pain.     insulin detemir 100 UNIT/ML injection  Commonly known as:  LEVEMIR  Inject 50-64 Units into the skin 2 (two) times daily. 50 units in the morning and 64  units in the evening     insulin lispro 100 UNIT/ML injection  Commonly known as:  HUMALOG  Inject 20-26 Units into the skin 4 (four) times daily - after meals and at bedtime. Sliding scale 24 units during the day and 28 units at dinner     meloxicam 7.5 MG tablet  Commonly known as:  MOBIC  TAKE ONE CAPSULE BY MOUTH DAILY     pioglitazone 45 MG tablet  Commonly known as:  ACTOS  Take 45 mg by mouth daily.     rosuvastatin 20 MG tablet  Commonly known as:  CRESTOR  Take 20 mg by mouth daily.     VITAMIN D (CHOLECALCIFEROL) PO  Take 1 tablet by mouth daily.         SignedTressie Stalker D 11/26/2014, 7:45 AM

## 2015-07-27 ENCOUNTER — Other Ambulatory Visit: Payer: Self-pay | Admitting: Physician Assistant

## 2015-07-27 DIAGNOSIS — M5416 Radiculopathy, lumbar region: Secondary | ICD-10-CM

## 2015-07-27 DIAGNOSIS — Z1231 Encounter for screening mammogram for malignant neoplasm of breast: Secondary | ICD-10-CM

## 2015-08-17 ENCOUNTER — Other Ambulatory Visit: Payer: Self-pay | Admitting: Physician Assistant

## 2015-08-17 ENCOUNTER — Ambulatory Visit
Admission: RE | Admit: 2015-08-17 | Discharge: 2015-08-17 | Disposition: A | Discharge status: Home / Self Care | Payer: Medicare Other | Source: Ambulatory Visit | Attending: Physician Assistant | Admitting: Physician Assistant

## 2015-08-17 DIAGNOSIS — Z1231 Encounter for screening mammogram for malignant neoplasm of breast: Secondary | ICD-10-CM

## 2015-08-18 ENCOUNTER — Ambulatory Visit: Admission: RE | Admit: 2015-08-18 | Payer: Medicare Other | Source: Ambulatory Visit

## 2015-10-07 ENCOUNTER — Other Ambulatory Visit: Payer: Self-pay | Admitting: Neurosurgery

## 2015-10-07 DIAGNOSIS — G8929 Other chronic pain: Secondary | ICD-10-CM

## 2015-10-07 DIAGNOSIS — M545 Low back pain, unspecified: Secondary | ICD-10-CM

## 2015-10-28 ENCOUNTER — Ambulatory Visit
Admission: RE | Admit: 2015-10-28 | Discharge: 2015-10-28 | Disposition: A | Discharge status: Home / Self Care | Payer: Medicare Other | Source: Ambulatory Visit | Attending: Neurosurgery | Admitting: Neurosurgery

## 2015-10-28 DIAGNOSIS — G8929 Other chronic pain: Secondary | ICD-10-CM

## 2015-10-28 DIAGNOSIS — M545 Low back pain: Principal | ICD-10-CM

## 2015-10-28 MED ORDER — DIAZEPAM 5 MG PO TABS
5.0000 mg | ORAL_TABLET | Freq: Once | ORAL | Status: AC
  Administered 2015-10-28: 5 mg via ORAL

## 2015-10-28 MED ORDER — IOPAMIDOL (ISOVUE-M 200) INJECTION 41%
15.0000 mL | Freq: Once | INTRAMUSCULAR | Status: AC
  Administered 2015-10-28: 15 mL via INTRATHECAL

## 2015-10-28 NOTE — Discharge Instructions (Signed)

## 2015-10-31 ENCOUNTER — Telehealth: Payer: Self-pay

## 2015-10-31 NOTE — Telephone Encounter (Signed)
Left message on patient's home phone to see how she is feeling after her myelogram here 10/28/15.  Tomasa Blasejkl

## 2015-11-22 ENCOUNTER — Encounter: Payer: Self-pay | Admitting: Cardiovascular Disease

## 2015-11-22 ENCOUNTER — Ambulatory Visit (INDEPENDENT_AMBULATORY_CARE_PROVIDER_SITE_OTHER): Payer: Medicare Other | Admitting: Cardiovascular Disease

## 2015-11-22 VITALS — BP 156/68 | HR 80 | Ht 64.0 in | Wt 263.0 lb

## 2015-11-22 DIAGNOSIS — Z7689 Persons encountering health services in other specified circumstances: Secondary | ICD-10-CM | POA: Diagnosis not present

## 2015-11-22 DIAGNOSIS — I209 Angina pectoris, unspecified: Secondary | ICD-10-CM | POA: Diagnosis not present

## 2015-11-22 DIAGNOSIS — I10 Essential (primary) hypertension: Secondary | ICD-10-CM | POA: Diagnosis not present

## 2015-11-22 DIAGNOSIS — Z01812 Encounter for preprocedural laboratory examination: Secondary | ICD-10-CM | POA: Diagnosis not present

## 2015-11-22 DIAGNOSIS — I208 Other forms of angina pectoris: Secondary | ICD-10-CM

## 2015-11-22 NOTE — Patient Instructions (Addendum)
Medication Instructions:  Your physician recommends that you continue on your current medications as directed. Please refer to the Current Medication list given to you today.   Labwork: BMET, CBC, PT/INR between Oct 19 and Oct 31  Testing/Procedures: A chest x-ray takes a picture of the organs and structures inside the chest, including the heart, lungs, and blood vessels. This test can show several things, including, whether the heart is enlarges; whether fluid is building up in the lungs; and whether pacemaker / defibrillator leads are still in place.  Your physician has requested that you have a cardiac catheterization. Cardiac catheterization is used to diagnose and/or treat various heart conditions. Doctors may recommend this procedure for a number of different reasons. The most common reason is to evaluate chest pain. Chest pain can be a symptom of coronary artery disease (CAD), and cardiac catheterization can show whether plaque is narrowing or blocking your heart's arteries. This procedure is also used to evaluate the valves, as well as measure the blood flow and oxygen levels in different parts of your heart. For further information please visit [email or web address] Please follow instruction sheet, as given.  [facility] Cardiac Cath Instructions   You are scheduled for a Cardiac Cath on: Thursday, Nov 2  Please arrive at 8:30am on the day of your procedure  [facility] to [facility] you  Do not eat/drink anything after midnight  Someone will need to drive you home  It is recommended someone be with you for the first 24 hours after your procedure  Wear clothes that are easy to get on/off and wear slip on shoes if possible   Medications bring a current list of all medications with you   xx___ Do not take these medications before your procedure: Do not take morning diabetes medication or lasix. Take 1/2 dose evening insulin the night  before your procedure.   Day [facility] your [facility]  Free valet service is available.  After entering the [facility] please check-in at the registration desk (1st desk on your right) to receive your armband. After receiving your armband someone will escort you to the cardiac cath/special procedures waiting area.  The usual length of stay after your procedure is about 2 to 3 hours.  This can vary.  If you have any questions, please call our office at [phone], or you may call the cardiac cath lab at [facility] directly at [phone]   Follow-Up: Your physician recommends that you schedule a follow-up appointment in: one month with Dr. Kirke CorinArida.    Any Other Special Instructions Will Be Listed Below (If Applicable).     If you need a refill on your cardiac medications before your next appointment, please call your pharmacy.   Angiogram An angiogram, also called angiography, is a procedure used to look at the blood vessels. In this procedure, dye is injected through a long, thin tube (catheter) into an artery. X-rays are then taken. The X-rays will show if there is a blockage or problem in a blood vessel.  LET [facility] PROVIDER KNOW ABOUT:  Any allergies you have, including allergies to shellfish or contrast dye.   All medicines you are taking, including vitamins, herbs, eye drops, creams, and over-the-counter medicines.   Previous problems you or members of your family have had with the use of anesthetics.   Any blood disorders you have.   Previous surgeries you have had.  Any previous kidney  problems or failure you have had.  Medical conditions you have.   Possibility of pregnancy, if this applies. RISKS AND COMPLICATIONS Generally, an angiogram is a safe procedure. However, as with any procedure, problems can occur. Possible problems include:  Injury to the blood vessels, including rupture or bleeding.  Infection or bruising  at the catheter site.  Allergic reaction to the dye or contrast used.  Kidney damage from the dye or contrast used.  Blood clots that can lead to a stroke or heart attack. BEFORE THE PROCEDURE  Do not eat or drink after midnight on the night before the procedure, or as directed by your health care provider.   Ask your health care provider if you may drink enough water to take any needed medicines the morning of the procedure.  PROCEDURE  You may be given a medicine to help you relax (sedative) before and during the procedure. This medicine is given through an IV access tube that is inserted into one of your veins.   The area where the catheter will be inserted will be washed and shaved. This is usually done in the groin but may be done in the fold of your arm (near your elbow) or in the wrist.  A medicine will be given to numb the area where the catheter will be inserted (local anesthetic).  The catheter will be inserted with a guide wire into an artery. The catheter is guided by using a type of X-ray (fluoroscopy) to the blood vessel being examined.   Dye is then injected into the catheter, and X-rays are taken. The dye helps to show where any narrowing or blockages are located.  AFTER THE PROCEDURE   If the procedure is done through the leg, you will be kept in bed lying flat for several hours. You will be instructed to not bend or cross your legs.  The insertion site will be checked frequently.  The pulse in your feet or wrist will be checked frequently.  Additional blood tests, X-rays, and electrocardiography may be done.   You may need to stay in the hospital overnight for observation.    This information is not intended to replace advice given to you by your health care provider. Make sure you discuss any questions you have with your health care provider.   Document Released: 11/01/2004 Document Revised: 02/12/2014 Document Reviewed: 06/25/2012 Elsevier Interactive  Patient Education [employer]

## 2015-11-22 NOTE — Progress Notes (Signed)
Cardiology Office Note   Date:  11/22/2015   ID:  Monica BalboaMary W Ray, DOB 06/15/1945, MRN [id number]  PCP:  Patrice ParadiseMCLAUGHLIN, MIRIAM K, MD  Cardiologist:   Lorine BearsMuhammad Fern Asmar, MD   Chief Complaint  Patient presents with  . Establish Care  . Shortness of Breath      History of Present Illness: Monica Ray is a 70 y.o. female who Is self-referred for evaluation of severe exertional dyspnea. She has no previous cardiac history. She has multiple chronic medical conditions that include type 2 diabetes of at least 18 years of duration currently on insulin, essential hypertension, hyperlipidemia and obesity. She is a lifelong nonsmoker. There is no family history of coronary artery disease but there is family history of cancer. She had back surgery about one year ago with gradual decline in functional status. She gained 15 pounds around that time when she recently moved steroids injections. Over the last year, she has experienced progressive exertional dyspnea currently happening even walking a few steps. She has noted orthopnea but no PND as well as frequent urination at night. She has been chronically on furosemide 20 mg once daily. She denies any chest discomfort. No previous ischemic cardiac evaluation.   Past Medical History:  Diagnosis Date  . Anemia    before hysterectomy  . Depression   . Diabetes mellitus, type 2 (HCC)   . DM retinopathy (HCC)   . Edema   . Hx of phlebitis   . Hyperlipidemia   . Hypertension   . Mild allergic rhinitis   . MRSA (methicillin resistant staph aureus) culture positive 03/2006  . OA (osteoarthritis)    hands    Past Surgical History:  Procedure Laterality Date  . ABDOMINAL HYSTERECTOMY    . ADENOIDECTOMY    . APPENDECTOMY  1989  . barium enema  1998   neg CT  . CHOLECYSTECTOMY  1989  . LUMBAR LAMINECTOMY/DECOMPRESSION MICRODISCECTOMY N/A 11/25/2014   Procedure: LUMBAR LAMINECTOMY/DECOMPRESSION MICRODISCECTOMY LUMBAR TWO-THREE, LUMBAR THREE-FOUR ;   Surgeon: Tressie StalkerJeffrey Jenkins, MD;  Location: [facility];  Service: Neurosurgery;  Laterality: N/A;  . ROTATOR CUFF REPAIR  08/2001   right     Current Outpatient Prescriptions  Medication Sig Dispense Refill  . Calcium Carbonate-Vit D-Min (CALCIUM 1200 PO) Take 1,200 mg by mouth daily.    . enalapril (VASOTEC) 20 MG tablet TAKE ONE (1) TABLET BY MOUTH TWO (2) TIMES DAILY 60 tablet 0  . furosemide (LASIX) 20 MG tablet Take 20 mg by mouth daily.    Marland Kitchen. HYDROcodone-acetaminophen (NORCO) 10-325 MG tablet Take 1 tablet by mouth every 6 (six) hours as needed for moderate pain or severe pain.    Marland Kitchen. insulin detemir (LEVEMIR) 100 UNIT/ML injection Inject 50-64 Units into the skin 2 (two) times daily. 50 units in the morning and 64 units in the evening    . insulin lispro (HUMALOG) 100 UNIT/ML injection Inject 20-26 Units into the skin 4 (four) times daily - after meals and at bedtime. Sliding scale 24 units during the day and 28 units at dinner    . meloxicam (MOBIC) 7.5 MG tablet TAKE ONE CAPSULE BY MOUTH DAILY 30 tablet 0  . pioglitazone (ACTOS) 45 MG tablet Take 45 mg by mouth daily.    . rosuvastatin (CRESTOR) 20 MG tablet Take 20 mg by mouth daily.     No current facility-administered medications for this visit.     Allergies:   Amoxicillin; Metformin; Azithromycin; Clindamycin; Fluconazole; Oxycodone-acetaminophen; and Simvastatin  Social History:  The patient  reports that she has never smoked. She has never used smokeless tobacco. She reports that she does not drink alcohol or use drugs.   Family History:  The patient's family history includes Alcohol abuse in her father and mother; Aneurysm in her father and paternal grandmother; Breast cancer in her paternal aunt; Cancer in her father; Cancer (age of onset: 8185) in her mother; Diabetes in her maternal grandmother and mother.    ROS:  Please see the history of present illness.   Otherwise, review of systems are positive for none.   All other  systems are reviewed and negative.    PHYSICAL EXAM: VS:  BP (!) 156/68 (BP Location: Right Arm, Patient Position: Sitting, Cuff Size: Large)   Pulse 80   Ht 5\' 4"  (1.626 m)   Wt 263 lb (119.3 kg)   SpO2 97%   BMI 45.14 kg/m  , BMI Body mass index is 45.14 kg/m. GEN: Well nourished, well developed, in no acute distress  HEENT: normal  Neck: no JVD, carotid bruits, or masses Cardiac: RRR; no murmurs, rubs, or gallops,no edema  Respiratory:  clear to auscultation bilaterally, normal work of breathing GI: soft, nontender, nondistended, + BS MS: no deformity or atrophy  Skin: warm and dry, no rash Neuro:  Strength and sensation are intact Psych: euthymic mood, full affect   EKG:  EKG is ordered today. The ekg ordered today demonstrates normal sinus rhythm with no significant ST or T wave changes.   Recent Labs: No results found for requested labs within last 8760 hours.    Lipid Panel    Component Value Date/Time   CHOL 125 10/07/2013 0812   TRIG 103.0 10/07/2013 0812   HDL 33.90 (L) 10/07/2013 0812   CHOLHDL 4 10/07/2013 0812   VLDL 20.6 10/07/2013 0812   LDLCALC 71 10/07/2013 0812      Wt Readings from Last 3 Encounters:  11/22/15 263 lb (119.3 kg)  11/25/14 252 lb (114.3 kg)  11/17/14 252 lb 3.2 oz (114.4 kg)       PAD Screen 11/22/2015  Previous PAD dx? No  Previous surgical procedure? No  Pain with walking? No  Feet/toe relief with dangling? No  Painful, non-healing ulcers? No  Extremities discolored? No      ASSESSMENT AND PLAN:  1.  Exertional dyspnea: Highly worrisome for angina equivalent given her prolonged history of diabetes and multiple risk factors for coronary artery disease. This has been happening with minimal activities even walking a few steps. She is extremely limited by this. In addition, she has some symptoms suggestive of heart failure including orthopnea and nocturia. I discussed with her different management options. I do think  that she has high pretest probability for underlying coronary artery disease. Stress testing is going to be somewhat limited by obesity with decreased accuracy. Given her symptoms, I have recommended proceeding with a right and left cardiac catheterization for a definitive diagnosis. The right heart catheterization is needed to ensure no pulmonary hypertension. I discussed risks, benefits and alternatives in details.  2. Essential hypertension: Blood pressure is elevated. I might consider adding small dose carvedilol.  3. Hyperlipidemia: Continue treatment with rosuvastatin with a target LDL of less than 70.    Disposition:   FU with me in 1 month  Signed,  Lorine BearsMuhammad Edu On, MD  11/22/2015 5:27 PM    [facility]

## 2015-12-01 ENCOUNTER — Other Ambulatory Visit
Admission: RE | Admit: 2015-12-01 | Discharge: 2015-12-01 | Disposition: A | Discharge status: Home / Self Care | Payer: Medicare Other | Source: Ambulatory Visit | Attending: Cardiovascular Disease | Admitting: Cardiovascular Disease

## 2015-12-01 ENCOUNTER — Ambulatory Visit
Admission: RE | Admit: 2015-12-01 | Discharge: 2015-12-01 | Disposition: A | Discharge status: Home / Self Care | Payer: Medicare Other | Source: Ambulatory Visit | Attending: Cardiovascular Disease | Admitting: Cardiovascular Disease

## 2015-12-01 DIAGNOSIS — Z01812 Encounter for preprocedural laboratory examination: Secondary | ICD-10-CM | POA: Insufficient documentation

## 2015-12-01 DIAGNOSIS — J811 Chronic pulmonary edema: Secondary | ICD-10-CM | POA: Insufficient documentation

## 2015-12-01 DIAGNOSIS — Z01818 Encounter for other preprocedural examination: Secondary | ICD-10-CM | POA: Diagnosis present

## 2015-12-01 LAB — CBC
HCT: 45.2 % (ref 35.0–47.0)
Hemoglobin: 15 g/dL (ref 12.0–16.0)
MCH: 31.3 pg (ref 26.0–34.0)
MCHC: 33.2 g/dL (ref 32.0–36.0)
MCV: 94.3 fL (ref 80.0–100.0)
PLATELETS: 239 10*3/uL (ref 150–440)
RBC: 4.79 MIL/uL (ref 3.80–5.20)
RDW: 13.2 % (ref 11.5–14.5)
WBC: 6.7 10*3/uL (ref 3.6–11.0)

## 2015-12-01 LAB — BASIC METABOLIC PANEL
Anion gap: 11 (ref 5–15)
BUN: 20 mg/dL (ref 6–20)
CHLORIDE: 102 mmol/L (ref 101–111)
CO2: 25 mmol/L (ref 22–32)
CREATININE: 0.93 mg/dL (ref 0.44–1.00)
Calcium: 9.4 mg/dL (ref 8.9–10.3)
GFR calc non Af Amer: >60 mL/min (ref >60)
Glucose, Bld: 209 mg/dL — ABNORMAL HIGH (ref 65–99)
POTASSIUM: 3.8 mmol/L (ref 3.5–5.1)
SODIUM: 138 mmol/L (ref 135–145)

## 2015-12-01 LAB — PROTIME-INR
INR: 0.93
Prothrombin Time: 12.5 seconds (ref 11.4–15.2)

## 2015-12-05 NOTE — Addendum Note (Signed)
Addended by: Awilda BillARDEN, Crucita Lacorte J on: 12/05/2015 11:28 AM   Modules accepted: Orders

## 2015-12-08 ENCOUNTER — Encounter
Admission: RE | Disposition: A | Discharge status: Home / Self Care | Payer: Self-pay | Source: Ambulatory Visit | Attending: Cardiovascular Disease

## 2015-12-08 ENCOUNTER — Ambulatory Visit
Admission: RE | Admit: 2015-12-08 | Discharge: 2015-12-08 | Disposition: A | Discharge status: Home / Self Care | Payer: Medicare Other | Source: Ambulatory Visit | Attending: Cardiovascular Disease | Admitting: Cardiovascular Disease

## 2015-12-08 DIAGNOSIS — E669 Obesity, unspecified: Secondary | ICD-10-CM | POA: Insufficient documentation

## 2015-12-08 DIAGNOSIS — I2510 Atherosclerotic heart disease of native coronary artery without angina pectoris: Secondary | ICD-10-CM | POA: Insufficient documentation

## 2015-12-08 DIAGNOSIS — Z6841 Body Mass Index (BMI) 40.0 and over, adult: Secondary | ICD-10-CM | POA: Diagnosis not present

## 2015-12-08 DIAGNOSIS — R0609 Other forms of dyspnea: Secondary | ICD-10-CM | POA: Insufficient documentation

## 2015-12-08 DIAGNOSIS — E11319 Type 2 diabetes mellitus with unspecified diabetic retinopathy without macular edema: Secondary | ICD-10-CM | POA: Insufficient documentation

## 2015-12-08 DIAGNOSIS — I208 Other forms of angina pectoris: Secondary | ICD-10-CM | POA: Diagnosis not present

## 2015-12-08 DIAGNOSIS — Z794 Long term (current) use of insulin: Secondary | ICD-10-CM | POA: Diagnosis not present

## 2015-12-08 DIAGNOSIS — E785 Hyperlipidemia, unspecified: Secondary | ICD-10-CM | POA: Diagnosis not present

## 2015-12-08 DIAGNOSIS — Z880 Allergy status to penicillin: Secondary | ICD-10-CM | POA: Insufficient documentation

## 2015-12-08 DIAGNOSIS — Z8614 Personal history of Methicillin resistant Staphylococcus aureus infection: Secondary | ICD-10-CM | POA: Diagnosis not present

## 2015-12-08 DIAGNOSIS — I272 Pulmonary hypertension, unspecified: Secondary | ICD-10-CM | POA: Insufficient documentation

## 2015-12-08 DIAGNOSIS — I10 Essential (primary) hypertension: Secondary | ICD-10-CM | POA: Diagnosis not present

## 2015-12-08 HISTORY — PX: CARDIAC CATHETERIZATION: SHX172

## 2015-12-08 LAB — GLUCOSE, CAPILLARY: GLUCOSE-CAPILLARY: 285 mg/dL — AB (ref 65–99)

## 2015-12-08 SURGERY — RIGHT/LEFT HEART CATH AND CORONARY ANGIOGRAPHY
Anesthesia: Moderate Sedation | Laterality: Bilateral

## 2015-12-08 MED ORDER — HEPARIN (PORCINE) IN NACL 2-0.9 UNIT/ML-% IJ SOLN
INTRAMUSCULAR | Status: AC
  Filled 2015-12-08: qty 500

## 2015-12-08 MED ORDER — SODIUM CHLORIDE 0.9% FLUSH: 3.0000 mL | Freq: Two times a day (BID) | INTRAVENOUS | Status: DC

## 2015-12-08 MED ORDER — IOPAMIDOL (ISOVUE-300) INJECTION 61%
INTRAVENOUS | Status: DC | PRN
  Administered 2015-12-08: 70 mL via INTRA_ARTERIAL

## 2015-12-08 MED ORDER — MIDAZOLAM HCL 2 MG/2ML IJ SOLN
INTRAMUSCULAR | Status: DC | PRN
  Administered 2015-12-08 (×2): 1 mg via INTRAVENOUS

## 2015-12-08 MED ORDER — SODIUM CHLORIDE 0.9% FLUSH: 3.0000 mL | INTRAVENOUS | Status: DC | PRN

## 2015-12-08 MED ORDER — SODIUM CHLORIDE 0.9 % IV SOLN: 250.0000 mL | INTRAVENOUS | Status: DC | PRN

## 2015-12-08 MED ORDER — VERAPAMIL HCL 2.5 MG/ML IV SOLN
INTRAVENOUS | Status: AC
  Filled 2015-12-08: qty 2

## 2015-12-08 MED ORDER — ASPIRIN 81 MG PO CHEW: 81.0000 mg | CHEWABLE_TABLET | ORAL | Status: DC

## 2015-12-08 MED ORDER — FENTANYL CITRATE (PF) 100 MCG/2ML IJ SOLN
INTRAMUSCULAR | Status: AC
  Filled 2015-12-08: qty 2

## 2015-12-08 MED ORDER — SODIUM CHLORIDE 0.9 % IV SOLN
INTRAVENOUS | Status: DC
  Administered 2015-12-08: 09:00:00 via INTRAVENOUS

## 2015-12-08 MED ORDER — MIDAZOLAM HCL 2 MG/2ML IJ SOLN
INTRAMUSCULAR | Status: AC
  Filled 2015-12-08: qty 2

## 2015-12-08 MED ORDER — HEPARIN SODIUM (PORCINE) 1000 UNIT/ML IJ SOLN
INTRAMUSCULAR | Status: DC | PRN
  Administered 2015-12-08: 6000 [IU] via INTRAVENOUS

## 2015-12-08 MED ORDER — FENTANYL CITRATE (PF) 100 MCG/2ML IJ SOLN
INTRAMUSCULAR | Status: DC | PRN
  Administered 2015-12-08: 25 ug via INTRAVENOUS
  Administered 2015-12-08: 50 ug via INTRAVENOUS

## 2015-12-08 MED ORDER — LABETALOL HCL 5 MG/ML IV SOLN
INTRAVENOUS | Status: AC
  Filled 2015-12-08: qty 4

## 2015-12-08 MED ORDER — HEPARIN SODIUM (PORCINE) 1000 UNIT/ML IJ SOLN
INTRAMUSCULAR | Status: AC
  Filled 2015-12-08: qty 1

## 2015-12-08 MED ORDER — SODIUM CHLORIDE 0.9 % IV SOLN: INTRAVENOUS | Status: DC

## 2015-12-08 SURGICAL SUPPLY — 10 items
CATH BALLN WEDGE 5F 110CM (CATHETERS) ×3 IMPLANT
CATH OPTITORQUE JACKY 4.0 5F (CATHETERS) ×3 IMPLANT
DEVICE RAD TR BAND REGULAR (VASCULAR PRODUCTS) ×3 IMPLANT
DRAPE BRACHIAL (DRAPES) ×6 IMPLANT
GLIDESHEATH SLEND SS 6F .021 (SHEATH) ×6 IMPLANT
GUIDEWIRE EMER 3M J .025X150CM (WIRE) ×3 IMPLANT
KIT MANI 3VAL PERCEP (MISCELLANEOUS) ×3 IMPLANT
KIT RIGHT HEART (MISCELLANEOUS) ×3 IMPLANT
PACK CARDIAC CATH (CUSTOM PROCEDURE TRAY) ×3 IMPLANT
WIRE SAFE-T 1.5MM-J .035X260CM (WIRE) ×3 IMPLANT

## 2015-12-08 NOTE — Interval H&P Note (Signed)
Cath Lab Visit (complete for each Cath Lab visit)  Clinical Evaluation Leading to the Procedure:   ACS: No.  Non-ACS:    Anginal Classification: CCS III  Anti-ischemic medical therapy: No Therapy  Non-Invasive Test Results: No non-invasive testing performed  Prior CABG: No previous CABG      History and Physical Interval Note:  12/08/2015 9:45 AM  Monica Ray  has presented today for surgery, with the diagnosis of LT and RT cath    Shortness of breath  The various methods of treatment have been discussed with the patient and family. After consideration of risks, benefits and other options for treatment, the patient has consented to  Procedure(s): Right/Left Heart Cath and Coronary Angiography (Bilateral) as a surgical intervention .  The patient's history has been reviewed, patient examined, no change in status, stable for surgery.  I have reviewed the patient's chart and labs.  Questions were answered to the patient's satisfaction.     Lorine BearsMuhammad Mayia Megill

## 2015-12-08 NOTE — CV Procedure (Signed)
Right and left cardiac cath showed mild pulmonary hypertension and mild nonobstructive CAD.  Full report to follow.

## 2015-12-08 NOTE — H&P (View-Only) (Signed)
Cardiology Office Note   Date:  11/22/2015   ID:  Monica BalboaMary W Flenner, DOB 06/15/1945, MRN [id number]  PCP:  Patrice ParadiseMCLAUGHLIN, MIRIAM K, MD  Cardiologist:   Lorine BearsMuhammad Arida, MD   Chief Complaint  Patient presents with  . Establish Care  . Shortness of Breath      History of Present Illness: Monica BalboaMary W Ray is a 70 y.o. female who Is self-referred for evaluation of severe exertional dyspnea. She has no previous cardiac history. She has multiple chronic medical conditions that include type 2 diabetes of at least 18 years of duration currently on insulin, essential hypertension, hyperlipidemia and obesity. She is a lifelong nonsmoker. There is no family history of coronary artery disease but there is family history of cancer. She had back surgery about one year ago with gradual decline in functional status. She gained 15 pounds around that time when she recently moved steroids injections. Over the last year, she has experienced progressive exertional dyspnea currently happening even walking a few steps. She has noted orthopnea but no PND as well as frequent urination at night. She has been chronically on furosemide 20 mg once daily. She denies any chest discomfort. No previous ischemic cardiac evaluation.   Past Medical History:  Diagnosis Date  . Anemia    before hysterectomy  . Depression   . Diabetes mellitus, type 2 (HCC)   . DM retinopathy (HCC)   . Edema   . Hx of phlebitis   . Hyperlipidemia   . Hypertension   . Mild allergic rhinitis   . MRSA (methicillin resistant staph aureus) culture positive 03/2006  . OA (osteoarthritis)    hands    Past Surgical History:  Procedure Laterality Date  . ABDOMINAL HYSTERECTOMY    . ADENOIDECTOMY    . APPENDECTOMY  1989  . barium enema  1998   neg CT  . CHOLECYSTECTOMY  1989  . LUMBAR LAMINECTOMY/DECOMPRESSION MICRODISCECTOMY N/A 11/25/2014   Procedure: LUMBAR LAMINECTOMY/DECOMPRESSION MICRODISCECTOMY LUMBAR TWO-THREE, LUMBAR THREE-FOUR ;   Surgeon: Tressie StalkerJeffrey Jenkins, MD;  Location: [facility];  Service: Neurosurgery;  Laterality: N/A;  . ROTATOR CUFF REPAIR  08/2001   right     Current Outpatient Prescriptions  Medication Sig Dispense Refill  . Calcium Carbonate-Vit D-Min (CALCIUM 1200 PO) Take 1,200 mg by mouth daily.    . enalapril (VASOTEC) 20 MG tablet TAKE ONE (1) TABLET BY MOUTH TWO (2) TIMES DAILY 60 tablet 0  . furosemide (LASIX) 20 MG tablet Take 20 mg by mouth daily.    Marland Kitchen. HYDROcodone-acetaminophen (NORCO) 10-325 MG tablet Take 1 tablet by mouth every 6 (six) hours as needed for moderate pain or severe pain.    Marland Kitchen. insulin detemir (LEVEMIR) 100 UNIT/ML injection Inject 50-64 Units into the skin 2 (two) times daily. 50 units in the morning and 64 units in the evening    . insulin lispro (HUMALOG) 100 UNIT/ML injection Inject 20-26 Units into the skin 4 (four) times daily - after meals and at bedtime. Sliding scale 24 units during the day and 28 units at dinner    . meloxicam (MOBIC) 7.5 MG tablet TAKE ONE CAPSULE BY MOUTH DAILY 30 tablet 0  . pioglitazone (ACTOS) 45 MG tablet Take 45 mg by mouth daily.    . rosuvastatin (CRESTOR) 20 MG tablet Take 20 mg by mouth daily.     No current facility-administered medications for this visit.     Allergies:   Amoxicillin; Metformin; Azithromycin; Clindamycin; Fluconazole; Oxycodone-acetaminophen; and Simvastatin  Social History:  The patient  reports that she has never smoked. She has never used smokeless tobacco. She reports that she does not drink alcohol or use drugs.   Family History:  The patient's family history includes Alcohol abuse in her father and mother; Aneurysm in her father and paternal grandmother; Breast cancer in her paternal aunt; Cancer in her father; Cancer (age of onset: 85) in her mother; Diabetes in her maternal grandmother and mother.    ROS:  Please see the history of present illness.   Otherwise, review of systems are positive for none.   All other  systems are reviewed and negative.    PHYSICAL EXAM: VS:  BP (!) 156/68 (BP Location: Right Arm, Patient Position: Sitting, Cuff Size: Large)   Pulse 80   Ht 5' 4" (1.626 m)   Wt 263 lb (119.3 kg)   SpO2 97%   BMI 45.14 kg/m  , BMI Body mass index is 45.14 kg/m. GEN: Well nourished, well developed, in no acute distress  HEENT: normal  Neck: no JVD, carotid bruits, or masses Cardiac: RRR; no murmurs, rubs, or gallops,no edema  Respiratory:  clear to auscultation bilaterally, normal work of breathing GI: soft, nontender, nondistended, + BS MS: no deformity or atrophy  Skin: warm and dry, no rash Neuro:  Strength and sensation are intact Psych: euthymic mood, full affect   EKG:  EKG is ordered today. The ekg ordered today demonstrates normal sinus rhythm with no significant ST or T wave changes.   Recent Labs: No results found for requested labs within last 8760 hours.    Lipid Panel    Component Value Date/Time   CHOL 125 10/07/2013 0812   TRIG 103.0 10/07/2013 0812   HDL 33.90 (L) 10/07/2013 0812   CHOLHDL 4 10/07/2013 0812   VLDL 20.6 10/07/2013 0812   LDLCALC 71 10/07/2013 0812      Wt Readings from Last 3 Encounters:  11/22/15 263 lb (119.3 kg)  11/25/14 252 lb (114.3 kg)  11/17/14 252 lb 3.2 oz (114.4 kg)       PAD Screen 11/22/2015  Previous PAD dx? No  Previous surgical procedure? No  Pain with walking? No  Feet/toe relief with dangling? No  Painful, non-healing ulcers? No  Extremities discolored? No      ASSESSMENT AND PLAN:  1.  Exertional dyspnea: Highly worrisome for angina equivalent given her prolonged history of diabetes and multiple risk factors for coronary artery disease. This has been happening with minimal activities even walking a few steps. She is extremely limited by this. In addition, she has some symptoms suggestive of heart failure including orthopnea and nocturia. I discussed with her different management options. I do think  that she has high pretest probability for underlying coronary artery disease. Stress testing is going to be somewhat limited by obesity with decreased accuracy. Given her symptoms, I have recommended proceeding with a right and left cardiac catheterization for a definitive diagnosis. The right heart catheterization is needed to ensure no pulmonary hypertension. I discussed risks, benefits and alternatives in details.  2. Essential hypertension: Blood pressure is elevated. I might consider adding small dose carvedilol.  3. Hyperlipidemia: Continue treatment with rosuvastatin with a target LDL of less than 70.    Disposition:   FU with me in 1 month  Signed,  Muhammad Arida, MD  11/22/2015 5:27 PM    [facility] 

## 2015-12-09 ENCOUNTER — Encounter: Payer: Self-pay | Admitting: Cardiovascular Disease

## 2015-12-12 ENCOUNTER — Other Ambulatory Visit: Payer: Self-pay

## 2015-12-12 DIAGNOSIS — R0609 Other forms of dyspnea: Principal | ICD-10-CM

## 2015-12-20 ENCOUNTER — Institutional Professional Consult (permissible substitution): Payer: Medicare Other | Admitting: Internal Medicine

## 2015-12-23 ENCOUNTER — Encounter: Payer: Self-pay | Admitting: Cardiovascular Disease

## 2015-12-23 ENCOUNTER — Ambulatory Visit (INDEPENDENT_AMBULATORY_CARE_PROVIDER_SITE_OTHER): Payer: Medicare Other | Admitting: Cardiovascular Disease

## 2015-12-23 DIAGNOSIS — I208 Other forms of angina pectoris: Secondary | ICD-10-CM

## 2015-12-23 DIAGNOSIS — I10 Essential (primary) hypertension: Secondary | ICD-10-CM | POA: Diagnosis not present

## 2015-12-23 DIAGNOSIS — I5032 Chronic diastolic (congestive) heart failure: Secondary | ICD-10-CM

## 2015-12-23 MED ORDER — FUROSEMIDE 40 MG PO TABS: 40.0000 mg | ORAL_TABLET | Freq: Every day | ORAL | 5 refills | Status: DC

## 2015-12-23 MED ORDER — LOSARTAN POTASSIUM 100 MG PO TABS: 100.0000 mg | ORAL_TABLET | Freq: Every day | ORAL | 5 refills | Status: DC

## 2015-12-23 NOTE — Patient Instructions (Signed)
Medication Instructions:  Your physician has recommended you make the following change in your medication:  STOP taking enalapril START taking losartan 100mg  once daily INCREASE lasix to 40mg  once daily   Labwork: none  Testing/Procedures: none  Follow-Up: Your physician recommends that you schedule a follow-up appointment in: 2 months with Dr. Kirke CorinArida.   Any Other Special Instructions Will Be Listed Below (If Applicable). A referral has been placed to nutritionist. They will call you to set up an appointment    If you need a refill on your cardiac medications before your next appointment, please call your pharmacy.

## 2015-12-23 NOTE — Progress Notes (Signed)
Cardiology Office Note   Date:  12/23/2015   ID:  Monica Ray, DOB 09/07/1945, MRN [id number]  PCP:  Patrice ParadiseMCLAUGHLIN, MIRIAM K, MD  Cardiologist:   Lorine BearsMuhammad Arida, MD   Chief Complaint  Patient presents with  . other    1 month f/u c/o sob, elevated BP and edema ankles. Pt upset and wants to see if she can switch to losartan? Meds reviewed verbally with pt.      History of Present Illness: Monica Ray is a 70 y.o. female who is here today for a follow up Visit regarding exertional dyspnea after recent right and left cardiac catheterization.  She has multiple chronic medical conditions that include type 2 diabetes of at least 18 years of duration currently on insulin, essential hypertension, hyperlipidemia and obesity. She is a lifelong nonsmoker. There is no family history of coronary artery disease but there is family history of cancer. She had back surgery about one year ago with gradual decline in functional status. She gained 15 pounds around that time when she received steroids injections.  She was seen recently for severe exertional dyspnea without chest pain. I proceeded with a right and left cardiac catheterization which showed normal LV systolic function with mildly elevated left ventricular end-diastolic pressure and mild pulmonary hypertension. There was no evidence of obstructive coronary artery disease. Pulmonary pressure was 37/17 with a mean of 25 mmHg. LVEDP was 18 mmHg. Chest x-ray before the cath showed mild pulmonary vascular congestion. She continues to have the same symptoms. She was seen by Dr. Meredeth IdeFleming for evaluation and she is scheduled for a sleep study.  Past Medical History:  Diagnosis Date  . Anemia    before hysterectomy  . Depression   . Diabetes mellitus, type 2 (HCC)   . DM retinopathy (HCC)   . Edema   . Hx of phlebitis   . Hyperlipidemia   . Hypertension   . Mild allergic rhinitis   . MRSA (methicillin resistant staph aureus) culture positive  03/2006  . OA (osteoarthritis)    hands    Past Surgical History:  Procedure Laterality Date  . ABDOMINAL HYSTERECTOMY    . ADENOIDECTOMY    . APPENDECTOMY  1989  . barium enema  1998   neg CT  . CARDIAC CATHETERIZATION Bilateral 12/08/2015   Procedure: Right/Left Heart Cath and Coronary Angiography;  Surgeon: Iran OuchMuhammad A Arida, MD;  Location: [facility];  Service: Cardiovascular;  Laterality: Bilateral;  . CHOLECYSTECTOMY  1989  . LUMBAR LAMINECTOMY/DECOMPRESSION MICRODISCECTOMY N/A 11/25/2014   Procedure: LUMBAR LAMINECTOMY/DECOMPRESSION MICRODISCECTOMY LUMBAR TWO-THREE, LUMBAR THREE-FOUR ;  Surgeon: Tressie StalkerJeffrey Jenkins, MD;  Location: [facility];  Service: Neurosurgery;  Laterality: N/A;  . ROTATOR CUFF REPAIR  08/2001   right     Current Outpatient Prescriptions  Medication Sig Dispense Refill  . enalapril (VASOTEC) 20 MG tablet TAKE ONE (1) TABLET BY MOUTH TWO (2) TIMES DAILY 60 tablet 0  . furosemide (LASIX) 20 MG tablet Take 20 mg by mouth daily.    . insulin detemir (LEVEMIR) 100 UNIT/ML injection Inject 50-64 Units into the skin 2 (two) times daily. 50 units in the morning and 64 units in the evening    . insulin lispro (HUMALOG) 100 UNIT/ML injection Inject 20-26 Units into the skin 4 (four) times daily - after meals and at bedtime. Sliding scale 24 units during the day and 28 units at dinner    . meloxicam (MOBIC) 7.5 MG tablet TAKE ONE CAPSULE  BY MOUTH DAILY 30 tablet 0  . pioglitazone (ACTOS) 45 MG tablet Take 45 mg by mouth daily.    . rosuvastatin (CRESTOR) 20 MG tablet Take 20 mg by mouth daily.     No current facility-administered medications for this visit.     Allergies:   Silver; Amoxicillin; Metformin; Azithromycin; Clindamycin; Fluconazole; Oxycodone-acetaminophen; and Simvastatin    Social History:  The patient  reports that she has never smoked. She has never used smokeless tobacco. She reports that she does not drink alcohol or use drugs.    Family History:  The patient's family history includes Alcohol abuse in her father and mother; Aneurysm in her father and paternal grandmother; Breast cancer in her paternal aunt; Cancer in her father; Cancer (age of onset: 6485) in her mother; Diabetes in her maternal grandmother and mother.    ROS:  Please see the history of present illness.   Otherwise, review of systems are positive for none.   All other systems are reviewed and negative.    PHYSICAL EXAM: VS:  BP (!) 170/78 (BP Location: Left Arm, Patient Position: Sitting, Cuff Size: Large)   Pulse 82   Ht 5\' 4"  (1.626 m)   Wt 270 lb (122.5 kg)   BMI 46.35 kg/m  , BMI Body mass index is 46.35 kg/m. GEN: Well nourished, well developed, in no acute distress  HEENT: normal  Neck: no JVD, carotid bruits, or masses Cardiac: RRR; no murmurs, rubs, or gallops,no edema  Respiratory:  clear to auscultation bilaterally, normal work of breathing GI: soft, nontender, nondistended, + BS MS: no deformity or atrophy  Skin: warm and dry, no rash Neuro:  Strength and sensation are intact Psych: euthymic mood, full affect Right radial pulse is normal with no hematoma.  EKG:  EKG is ordered today. The ekg ordered today demonstrates normal sinus rhythm with no significant ST or T wave changes.   Recent Labs: 12/01/2015: BUN 20; Creatinine, Ser 0.93; Hemoglobin 15.0; Platelets 239; Potassium 3.8; Sodium 138    Lipid Panel    Component Value Date/Time   CHOL 125 10/07/2013 0812   TRIG 103.0 10/07/2013 0812   HDL 33.90 (L) 10/07/2013 0812   CHOLHDL 4 10/07/2013 0812   VLDL 20.6 10/07/2013 0812   LDLCALC 71 10/07/2013 0812      Wt Readings from Last 3 Encounters:  12/23/15 270 lb (122.5 kg)  12/08/15 263 lb (119.3 kg)  11/22/15 263 lb (119.3 kg)       PAD Screen 11/22/2015  Previous PAD dx? No  Previous surgical procedure? No  Pain with walking? No  Feet/toe relief with dangling? No  Painful, non-healing ulcers? No   Extremities discolored? No      ASSESSMENT AND PLAN:  1.  Exertional dyspnea:  I suspect that this is likely multifactorial due to mild degree of chronic diastolic heart failure, physical deconditioning and possible underlying sleep apnea. There was only mild pulmonary hypertension and mildly elevated filling pressures. I elected to increase the dose of furosemide to 40 mg once daily. We need to control her blood pressure. I agree with a sleep study.  2. Essential hypertension: Blood pressure continues to be elevated. She reports that she felt better when she took losartan as a substitute to enalapril. She did that recently when she was out of her medication and she used her husband's medication. She has no symptoms of dry cough but I think it's reasonable to switch to losartan to see if there is symptomatic improvement.  I stopped enalapril and switched to losartan 100 mg once daily. I asked her to call us back in one week to report her blood pressure readings. If blood pressure remains elevated, I plan on starting amlodipine.  3. Hyperlipidemia: Continue treatment with rosuvastatin with a target LDL of less than 70.  4. Obesity: I think this is contributing to some of her symptoms. Unfortunately, she is not able to exercise due to chronic back issues. I referred her to a nutritionist for a consult.  Disposition:   FU with me in 1 month  Signed,  Lorine Bears, MD  12/23/2015 2:39 PM    [facility]

## 2015-12-27 NOTE — Addendum Note (Signed)
Addended by: Kendrick FriesLOPEZ, MARINA C on: 12/27/2015 07:37 AM   Modules accepted: Orders

## 2016-01-24 ENCOUNTER — Ambulatory Visit: Payer: Medicare Other | Attending: Specialist

## 2016-01-24 DIAGNOSIS — G4733 Obstructive sleep apnea (adult) (pediatric): Secondary | ICD-10-CM | POA: Insufficient documentation

## 2016-01-27 ENCOUNTER — Encounter: Payer: Medicare Other | Attending: Physician Assistant | Admitting: *Deleted

## 2016-01-27 ENCOUNTER — Encounter: Payer: Self-pay | Admitting: *Deleted

## 2016-01-27 VITALS — BP 150/74 | Ht 64.0 in | Wt 262.5 lb

## 2016-01-27 DIAGNOSIS — Z713 Dietary counseling and surveillance: Secondary | ICD-10-CM | POA: Insufficient documentation

## 2016-01-27 DIAGNOSIS — E119 Type 2 diabetes mellitus without complications: Secondary | ICD-10-CM | POA: Diagnosis not present

## 2016-01-27 DIAGNOSIS — E1165 Type 2 diabetes mellitus with hyperglycemia: Secondary | ICD-10-CM

## 2016-01-27 NOTE — Patient Instructions (Addendum)
Check blood sugars 4 x day before each meal and before bed every day  Eat 3 meals day,  2  snacks a day Space meals 4-6 hours apart Limit desserts/sweets Avoid sugar sweetened drinks (tea) unless treating a low blood sugar Complete 3 Day Food Record and bring to next appt  Bring blood sugar records to the next appointment  Carry fast acting glucose and a snack at all times  Rotate injection sites Change pen needles at least daily  Return for appointment on:  Wednesday February 22, 2016 at 9:00 am [facility]

## 2016-01-27 NOTE — Progress Notes (Signed)
Diabetes Self-Management Education  Visit Type: First/Initial  Appt. Start Time: 1105 Appt. End Time: 1245  01/27/2016  [name], identified by name and date of birth, is a 70 y.o. female with a diagnosis of Diabetes: Type 2.   ASSESSMENT  Blood pressure (!) 150/74, height 5\' 4"  (1.626 m), weight 262 lb 8 oz (119.1 kg). Body mass index is 45.06 kg/m.      Diabetes Self-Management Education - 01/27/16 1256        Visit Information    Visit Type First/Initial       Initial Visit    Diabetes Type Type 2    Are you currently following a meal plan? No    Are you taking your medications as prescribed? Yes    Date Diagnosed 18 years ago       Health Coping    How would you rate your overall health? Poor       Psychosocial Assessment    Patient Belief/Attitude about Diabetes Defeat/Burnout    Self-care barriers None    Self-management support Doctor's office;Church;Family    Other persons present Family Member  Sister    Patient Concerns Nutrition/Meal planning;Medication;Monitoring;Glycemic Control;Weight Control    Special Needs None    Preferred Learning Style Auditory    Learning Readiness Ready    What is the last grade level you completed in school? 1 year college       Pre-Education Assessment    Patient understands the diabetes disease and treatment process. Needs Instruction    Patient understands incorporating nutritional management into lifestyle. Needs Instruction    Patient undertands incorporating physical activity into lifestyle. Needs Instruction    Patient understands using medications safely. Needs Instruction    Patient understands monitoring blood glucose, interpreting and using results Needs Review    Patient understands prevention, detection, and treatment of acute complications. Needs Review    Patient understands prevention, detection, and treatment of chronic complications. Needs Review    Patient understands how to develop strategies to  address psychosocial issues. Needs Instruction    Patient understands how to develop strategies to promote health/change behavior. Needs Instruction       Complications    Last HgB A1C per patient/outside source 8.8 %  07/15/15    How often do you check your blood sugar? 3-4 times/day    Fasting Blood glucose range (mg/dL) >161;096-045;409-811>200;130-179;180-200  Pt reports FBG's 140-220's mg/dL with reading of 914241 mg/dL today,     Postprandial Blood glucose range (mg/dL) --  Pt reports readings before lunch 150-200 mg/dL; before supper 782-956'O120-130's mg/dL. She doesn't always check at bedtime.     Number of hypoglycemic episodes per month --  Pt reports none in last 2 weeks but usually occurs in middle of night. She reports low blood sugars of 50-82 mg./dL.     Have you had a dilated eye exam in the past 12 months? Yes    Have you had a dental exam in the past 12 months? Yes    Are you checking your feet? Yes    How many days per week are you checking your feet? 7       Dietary Intake    Breakfast Eats out for most meals;  eats granola bar or yogurt    Lunch has breakfast foods - eggs, bacon, hashbrown, toast; BBQ chicken, fried okra, stewed squash, cornbread    Snack (afternoon) fruit    Dinner roast beef sandwich; hamburger steak with vegetables  Snack (evening) sweets    Beverage(s) water, coffee, diet soda, occasional 1/2 and 1/2 tea       Exercise    Exercise Type ADL's  Pt reports she can't exercise due to her back,        Patient Education    Previous Diabetes Education Yes (please comment)    Disease state  Explored patient's options for treatment of their diabetes    Nutrition management  Role of diet in the treatment of diabetes and the relationship between the three main macronutrients and blood glucose level;Carbohydrate counting;Meal timing in regards to the patients' current diabetes medication.;Information on hints to eating out and maintain blood glucose control.    Physical activity and  exercise  Role of exercise on diabetes management, blood pressure control and cardiac health.    Medications Taught/reviewed insulin injection, site rotation, insulin storage and needle disposal.;Reviewed patients medication for diabetes, action, purpose, timing of dose and side effects.    Monitoring Purpose and frequency of SMBG.;Taught/discussed recording of test results and interpretation of SMBG.;Identified appropriate SMBG and/or A1C goals.    Acute complications Taught treatment of hypoglycemia - the 15 rule.    Chronic complications Relationship between chronic complications and blood glucose control    Psychosocial adjustment Role of stress on diabetes;Identified and addressed patients feelings and concerns about diabetes       Individualized Goals (developed by patient)    Reducing Risk Improve blood sugars Decrease medications Prevent diabetes complications Lose weight       Outcomes    Expected Outcomes Demonstrated interest in learning. Expect positive outcomes    Future DMSE 4-6 wks      Individualized Plan for Diabetes Self-Management Training:   Learning Objective:  Patient will have a greater understanding of diabetes self-management. Patient education plan is to attend individual and/or group sessions per assessed needs and concerns.   Plan:   Patient Instructions  Check blood sugars 4 x day before each meal and before bed every day Eat 3 meals day,  2  snacks a day Space meals 4-6 hours apart Limit desserts/sweets Avoid sugar sweetened drinks (tea) unless treating a low blood sugar Complete 3 Day Food Record and bring to next appt Bring blood sugar records to the next appointment Carry fast acting glucose and a snack at all times Rotate injection sites Change pen needles at least daily Return for appointment on:  Wednesday February 22, 2016 [facility] 9:00 [facility]  Expected Outcomes:  Demonstrated interest in learning. Expect positive  outcomes  Education material provided:  General Meal Planning Guidelines Simple Meal Plan 3 Day Food Record Glucose tablets Symptoms, causes and treatments of Hypoglycemia Resource sheet for Diabetes apps, websites  If problems or questions, patient to contact team via:  Sharion SettlerSheila Shotwell, RN, CCM, CDE [phone]  Future DSME appointment: 4-6 wks  February 22, 2016 with the dietitian

## 2016-02-22 ENCOUNTER — Ambulatory Visit: Payer: Medicare Other | Admitting: Dietician

## 2016-02-24 ENCOUNTER — Ambulatory Visit: Payer: Medicare Other | Admitting: Cardiovascular Disease

## 2016-02-27 ENCOUNTER — Encounter: Payer: Medicare Other | Attending: Physician Assistant | Admitting: Dietician

## 2016-02-27 ENCOUNTER — Encounter: Payer: Self-pay | Admitting: Dietician

## 2016-02-27 VITALS — BP 130/68 | Ht 64.0 in | Wt 256.1 lb

## 2016-02-27 DIAGNOSIS — E119 Type 2 diabetes mellitus without complications: Secondary | ICD-10-CM | POA: Insufficient documentation

## 2016-02-27 DIAGNOSIS — E1165 Type 2 diabetes mellitus with hyperglycemia: Secondary | ICD-10-CM

## 2016-02-27 DIAGNOSIS — Z713 Dietary counseling and surveillance: Secondary | ICD-10-CM | POA: Diagnosis present

## 2016-02-27 NOTE — Progress Notes (Signed)
Diabetes Self-Management Education  Visit Type:  Follow-up  Appt. Start Time: 0930 Appt. End Time: 1040  02/27/2016  [name], identified by name and date of birth, is a 71 y.o. female with a diagnosis of Diabetes:  .   ASSESSMENT  Blood pressure 130/68, height 5\' 4"  (1.626 m), weight 256 lb 1.6 oz (116.2 kg). Body mass index is 43.96 kg/m.       Diabetes Self-Management Education - 02/27/16 0944      Complications   How often do you check your blood sugar? 3-4 times/day   Fasting Blood glucose range (mg/dL) 21-308;657-846;[id number];130-179;180-200   Postprandial Blood glucose range (mg/dL) 84-132;440-102;725-36670-129;130-179;180-200   Number of hypoglycemic episodes per month 7   Can you tell when your blood sugar is low? Yes   What do you do if your blood sugar is low? eat crackers if during the night, or drink orange juice   Have you had a dilated eye exam in the past 12 months? Yes   Have you had a dental exam in the past 12 months? Yes   Are you checking your feet? Yes   How many days per week are you checking your feet? 7     Dietary Intake   Breakfast 3 meals and 2-3 snacks daily     Exercise   Exercise Type ADL's     Patient Education   Nutrition management  Role of diet in the treatment of diabetes and the relationship between the three main macronutrients and blood glucose level;Food label reading, portion sizes and measuring food.;Meal timing in regards to the patients' current diabetes medication.;Information on hints to eating out and maintain blood glucose control.;Meal options for control of blood glucose level and chronic complications.;Other (comment)  guidance for 1300kcal wt loss diet, and basic meal planning   Medications Reviewed patients medication for diabetes, action, purpose, timing of dose and side effects.   Monitoring Taught/discussed recording of test results and interpretation of SMBG.   Acute complications Taught treatment of hypoglycemia - the 15 rule.     Post-Education  Assessment   Patient understands the diabetes disease and treatment process. Demonstrates understanding / competency   Patient understands incorporating nutritional management into lifestyle. Demonstrates understanding / competency   Patient undertands incorporating physical activity into lifestyle. Needs Review   Patient understands using medications safely. Demonstrates understanding / competency   Patient understands monitoring blood glucose, interpreting and using results Demonstrates understanding / competency   Patient understands prevention, detection, and treatment of acute complications. Demonstrates understanding / competency   Patient understands prevention, detection, and treatment of chronic complications. Demonstrates understanding / competency   Patient understands how to develop strategies to address psychosocial issues. Needs Review   Patient understands how to develop strategies to promote health/change behavior. Needs Review     Outcomes   Program Status Completed      Patient brought BG record which reflects improved BG control. She has experienced some low BGs during the night, which she has treated with either juice or crackers. Her evening snack is usually only fruit, no protein. She has lost a total of 14 lbs in the past 2-3 months, and is able to do more physically without becoming winded.   Plan:   Patient Instructions   Make healthy choices at restaurants; remember small-moderate meat portions, starch portions that are 1 cup (fist-size) or less, and generous portions of vegetables. Try eating 1/2 the meal (except all the veggies).   Include a protein food  with evening snack, such as peanut butter 1-2Tbsp, 1/4 cup nuts, or 1oz lowfat cheese. This might prevent low sugars during the night.   Eat at least 30g carb (2 servings) with each meal, but not more than 45g or 3 servings.     Education material provided: Planning A Balanced Meal; Quick and Healthy Meal  Ideas; dining out guidelines  If problems or questions, patient to contact team via:  Phone or email

## 2016-02-27 NOTE — Patient Instructions (Signed)
   Make healthy choices at restaurants; remember small-moderate meat portions, starch portions that are 1 cup (fist-size) or less, and generous portions of vegetables. Try eating 1/2 the meal (except all the veggies).   Include a protein food with evening snack, such as peanut butter 1-2Tbsp, 1/4 cup nuts, or 1oz lowfat cheese. This might prevent low sugars during the night.   Eat at least 30g carb (2 servings) with each meal, but not more than 45g or 3 servings.

## 2016-03-12 ENCOUNTER — Ambulatory Visit (INDEPENDENT_AMBULATORY_CARE_PROVIDER_SITE_OTHER): Payer: Medicare Other | Admitting: Cardiovascular Disease

## 2016-03-12 ENCOUNTER — Encounter: Payer: Self-pay | Admitting: Cardiovascular Disease

## 2016-03-12 VITALS — BP 142/60 | HR 79 | Ht 64.0 in | Wt 256.2 lb

## 2016-03-12 DIAGNOSIS — E785 Hyperlipidemia, unspecified: Secondary | ICD-10-CM

## 2016-03-12 DIAGNOSIS — I10 Essential (primary) hypertension: Secondary | ICD-10-CM

## 2016-03-12 DIAGNOSIS — I5032 Chronic diastolic (congestive) heart failure: Secondary | ICD-10-CM | POA: Diagnosis not present

## 2016-03-12 NOTE — Progress Notes (Signed)
Cardiology Office Note   Date:  03/12/2016   ID:  Monica Ray, DOB 11/19/1945, MRN [id number]  PCP:  Patrice ParadiseMCLAUGHLIN, MIRIAM K, MD  Cardiologist:   Lorine BearsMuhammad Arida, MD   Chief Complaint  Patient presents with  . other    2 month f/u no complaints today. Meds reviewed verbally with pt.      History of Present Illness: Archie BalboaMary W Ray is a 71 y.o. female who is here today for a follow up visit regarding Chronic diastolic heart failure without coronary artery disease. She has multiple chronic medical conditions that include type 2 diabetes of at least 18 years of duration currently on insulin, essential hypertension, hyperlipidemia and obesity. She is a lifelong nonsmoker. There is no family history of coronary artery disease but there is family history of cancer. She had back surgery about one year ago with gradual decline in functional status.  She was evaluated for severe exertional dyspnea without chest pain. A right and left cardiac catheterization in November 2017 showed normal LV systolic function with mildly elevated left ventricular end-diastolic pressure and mild pulmonary hypertension. There was no evidence of obstructive coronary artery disease. Pulmonary pressure was 37/17 with a mean of 25 mmHg. LVEDP was 18 mmHg. Chest x-Ray before the cath showed mild pulmonary vascular congestion. During last visit, I switched enalapril to losartan and increase furosemide to 40 mg once daily. She underwent a sleep study which showed sleep apnea but she did not tolerate CPAP. She reports improvement in symptoms and she has lost about 5 pounds since last visit. She is working hard on her diet.   Past Medical History:  Diagnosis Date  . Anemia    before hysterectomy  . Depression   . Diabetes mellitus, type 2 (HCC)   . DM retinopathy (HCC)   . Edema   . Hx of phlebitis   . Hyperlipidemia   . Hypertension   . Mild allergic rhinitis   . MRSA (methicillin resistant staph aureus) culture  positive 03/2006  . OA (osteoarthritis)    hands  . Sleep apnea     Past Surgical History:  Procedure Laterality Date  . ABDOMINAL HYSTERECTOMY    . ADENOIDECTOMY    . APPENDECTOMY  1989  . barium enema  1998   neg CT  . CARDIAC CATHETERIZATION Bilateral 12/08/2015   Procedure: Right/Left Heart Cath and Coronary Angiography;  Surgeon: Iran OuchMuhammad A Arida, MD;  Location: [facility] INVASIVE CV LAB;  Service: Cardiovascular;  Laterality: Bilateral;  . CHOLECYSTECTOMY  1989  . LUMBAR LAMINECTOMY/DECOMPRESSION MICRODISCECTOMY N/A 11/25/2014   Procedure: LUMBAR LAMINECTOMY/DECOMPRESSION MICRODISCECTOMY LUMBAR TWO-THREE, LUMBAR THREE-FOUR ;  Surgeon: Tressie StalkerJeffrey Jenkins, MD;  Location: [facility]UR[facility];  Service: Neurosurgery;  Laterality: N/A;  . ROTATOR CUFF REPAIR  08/2001   right     Current Outpatient Prescriptions  Medication Sig Dispense Refill  . cholecalciferol (VITAMIN D) 1000 units tablet Take 1,000 Units by mouth daily.    . clotrimazole-betamethasone (LOTRISONE) cream Apply 1 application topically 2 (two) times daily.    . DULoxetine (CYMBALTA) 20 MG capsule Take 40 mg by mouth daily.     . furosemide (LASIX) 40 MG tablet Take 1 tablet (40 mg total) by mouth daily. 30 tablet 5  . HYDROcodone-acetaminophen (NORCO) 10-325 MG tablet Take 0.5 tablets by mouth every 6 (six) hours as needed.    . insulin detemir (LEVEMIR) 100 UNIT/ML injection Inject 50-64 Units into the skin 2 (two) times daily. 50 units in the morning and  64 units in the evening    . insulin lispro (HUMALOG) 100 UNIT/ML injection Inject 20-26 Units into the skin 4 (four) times daily - after meals and at bedtime. 24 units before breakfast and lunch and 28 units before supper - plus sliding scale (4 units per 50 over a target of 150)    . losartan (COZAAR) 100 MG tablet Take 1 tablet (100 mg total) by mouth daily. 30 tablet 5  . meloxicam (MOBIC) 7.5 MG tablet TAKE ONE CAPSULE BY MOUTH DAILY 30 tablet 0  . pioglitazone (ACTOS) 45 MG  tablet Take 45 mg by mouth daily.    . rosuvastatin (CRESTOR) 20 MG tablet Take 10 mg by mouth daily.      No current facility-administered medications for this visit.     Allergies:   Tegaderm ag mesh [silver]; Amoxicillin; Metformin; Azithromycin; Clindamycin; Fluconazole; Oxycodone-acetaminophen; and Simvastatin    Social History:  The patient  reports that she has never smoked. She has never used smokeless tobacco. She reports that she does not drink alcohol or use drugs.   Family History:  The patient's family history includes Alcohol abuse in her father and mother; Aneurysm in her father and paternal grandmother; Breast cancer in her paternal aunt; Cancer in her father; Cancer (age of onset: 45) in her mother; Diabetes in her maternal grandmother and mother.    ROS:  Please see the history of present illness.   Otherwise, review of systems are positive for none.   All other systems are reviewed and negative.    PHYSICAL EXAM: VS:  BP (!) 142/60 (BP Location: Left Arm, Patient Position: Sitting, Cuff Size: Large)   Pulse 79   Ht 5\' 4"  (1.626 m)   Wt 256 lb 4 oz (116.2 kg)   BMI 43.99 kg/m  , BMI Body mass index is 43.99 kg/m. GEN: Well nourished, well developed, in no acute distress  HEENT: normal  Neck: no JVD, carotid bruits, or masses Cardiac: RRR; no murmurs, rubs, or gallops,no edema  Respiratory:  clear to auscultation bilaterally, normal work of breathing GI: soft, nontender, nondistended, + BS MS: no deformity or atrophy  Skin: warm and dry, no rash Neuro:  Strength and sensation are intact Psych: euthymic mood, full affect   EKG:  EKG is not ordered today.   Recent Labs: 12/01/2015: BUN 20; Creatinine, Ser 0.93; Hemoglobin 15.0; Platelets 239; Potassium 3.8; Sodium 138    Lipid Panel    Component Value Date/Time   CHOL 125 10/07/2013 0812   TRIG 103.0 10/07/2013 0812   HDL 33.90 (L) 10/07/2013 0812   CHOLHDL 4 10/07/2013 0812   VLDL 20.6 10/07/2013  0812   LDLCALC 71 10/07/2013 0812      Wt Readings from Last 3 Encounters:  03/12/16 256 lb 4 oz (116.2 kg)  02/27/16 256 lb 1.6 oz (116.2 kg)  01/27/16 262 lb 8 oz (119.1 kg)       PAD Screen 11/22/2015  Previous PAD dx? No  Previous surgical procedure? No  Pain with walking? No  Feet/toe relief with dangling? No  Painful, non-healing ulcers? No  Extremities discolored? No      ASSESSMENT AND PLAN:  1.   Chronic diastolic heart failure: Symptoms improved after increasing the dose of furosemide to 40 mg once daily. I suspect that sleep apnea is also contributing. Unfortunately, she did not tolerate CPAP.  2. Essential hypertension: Blood pressure has improved since last visit.  3. Hyperlipidemia: Continue treatment with rosuvastatin with a  target LDL of less than 70.  4. Diabetes mellitus: Given mild heart failure, Consider decreasing the dose of Actos or switching to a different medication.  Disposition:   FU with me in 6 month  Signed,  Lorine Bears, MD  03/12/2016 3:01 PM    [facility]

## 2016-03-12 NOTE — Patient Instructions (Signed)
Medication Instructions: No changes.   Labwork: None.   Procedures/Testing: None.   Follow-Up: 6 months with Dr. Telicia Hodgkiss.   Any Additional Special Instructions Will Be Listed Below (If Applicable).     If you need a refill on your cardiac medications before your next appointment, please call your pharmacy.   

## 2016-07-31 ENCOUNTER — Other Ambulatory Visit: Payer: Self-pay | Admitting: Physician Assistant

## 2016-07-31 DIAGNOSIS — Z1231 Encounter for screening mammogram for malignant neoplasm of breast: Secondary | ICD-10-CM

## 2016-08-03 ENCOUNTER — Other Ambulatory Visit: Payer: Self-pay | Admitting: Cardiovascular Disease

## 2016-08-21 ENCOUNTER — Inpatient Hospital Stay: Admission: RE | Admit: 2016-08-21 | Payer: Medicare Other | Source: Ambulatory Visit

## 2016-09-21 ENCOUNTER — Ambulatory Visit
Admission: RE | Admit: 2016-09-21 | Discharge: 2016-09-21 | Disposition: A | Discharge status: Home / Self Care | Payer: Medicare Other | Source: Ambulatory Visit | Attending: Physician Assistant | Admitting: Physician Assistant

## 2016-09-21 DIAGNOSIS — Z1231 Encounter for screening mammogram for malignant neoplasm of breast: Secondary | ICD-10-CM

## 2016-10-12 ENCOUNTER — Ambulatory Visit
Admission: RE | Admit: 2016-10-12 | Discharge: 2016-10-12 | Disposition: A | Discharge status: Home / Self Care | Payer: Medicare Other | Source: Ambulatory Visit | Attending: Physician Assistant | Admitting: Physician Assistant

## 2016-10-12 DIAGNOSIS — Z1231 Encounter for screening mammogram for malignant neoplasm of breast: Secondary | ICD-10-CM | POA: Diagnosis present

## 2016-10-23 IMAGING — MR MR CERVICAL SPINE W/O CM
4 of 5 series · 27 of 48 positions shown · non-contrast
Comparison: None.

CLINICAL DATA: Cervical spine pain and left arm numbness. Going on
for 1 year.

EXAM:
MRI CERVICAL SPINE WITHOUT CONTRAST
TECHNIQUE: Multiplanar, multisequence MR imaging of the cervical spine was
performed. No intravenous contrast was administered.

[Series 3: T2 · sagittal · 3.0mm · 0.66mm/px · 6 of 13 slices shown (1 of 2)]
[im 1/13]
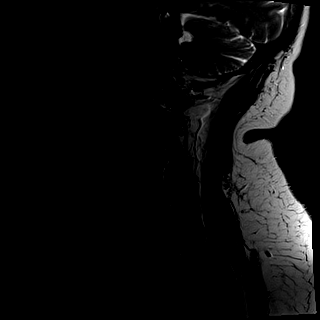
[im 3/13]
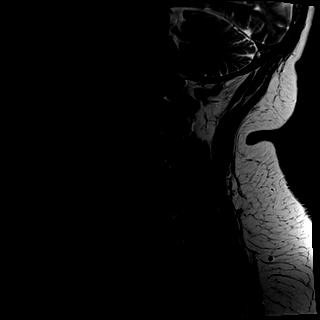
[im 5/13]
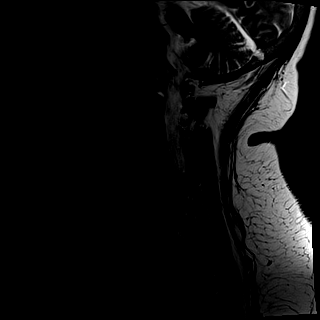
[im 8/13]
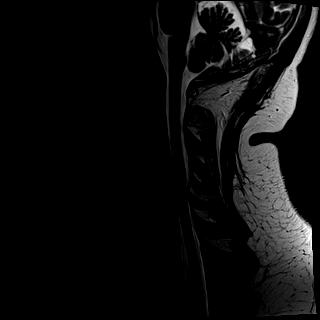
[im 10/13]
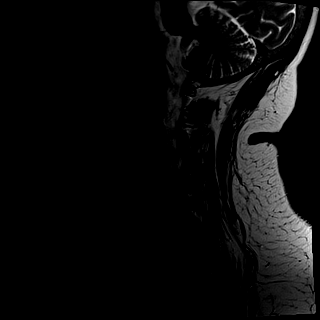
[im 13/13]
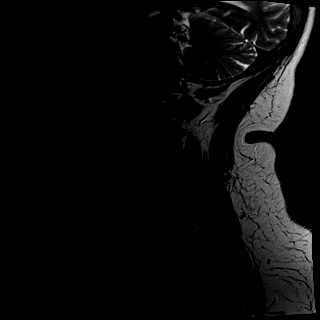

[Series 4: T1 · sagittal · 3.0mm · 0.41mm/px · 7 of 13 slices shown]
[im 1/13]
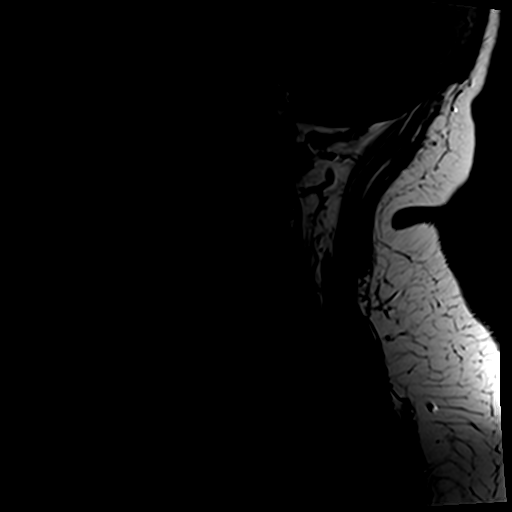
[im 3/13]
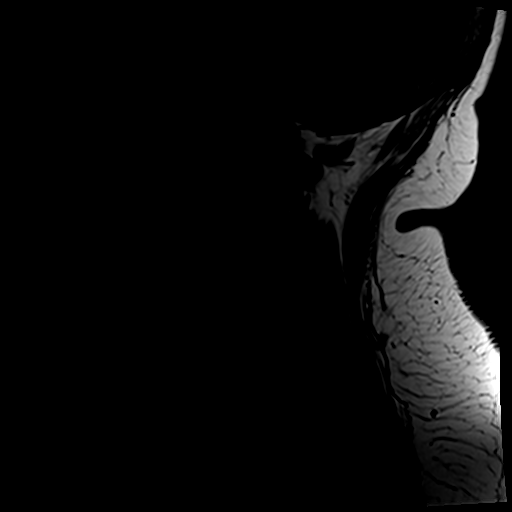
[im 5/13]
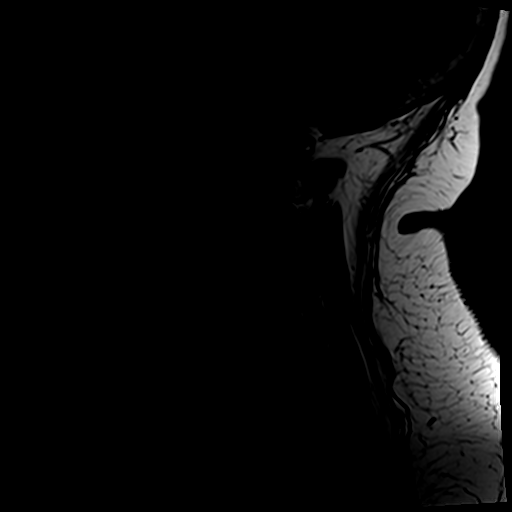
[im 7/13]
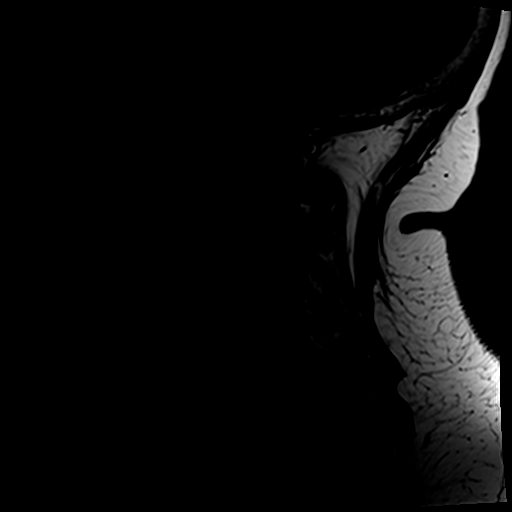
[im 9/13]
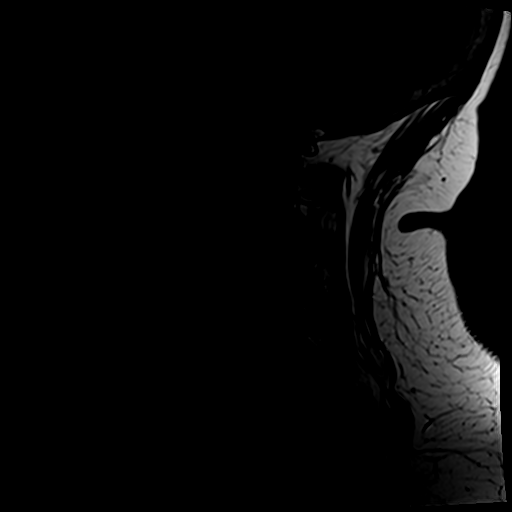
[im 11/13]
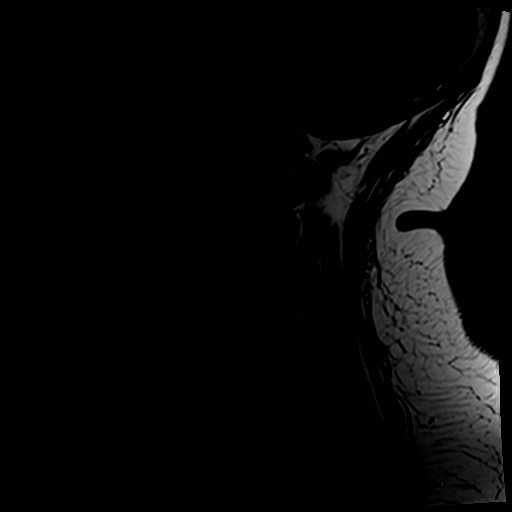
[im 13/13]
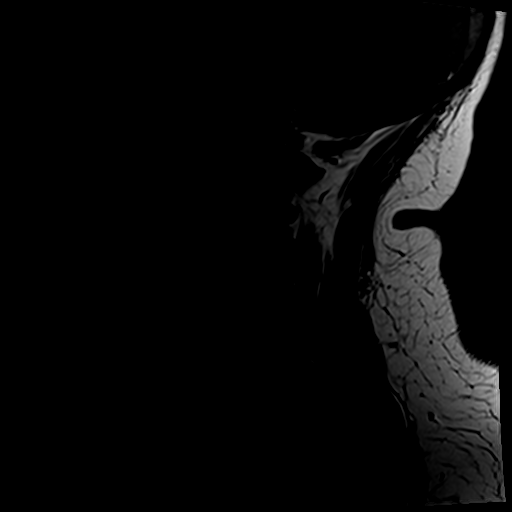

[Series 5: tir sag · sagittal · 3.0mm · 0.41mm/px · 6 of 13 slices shown]
[im 1/13]
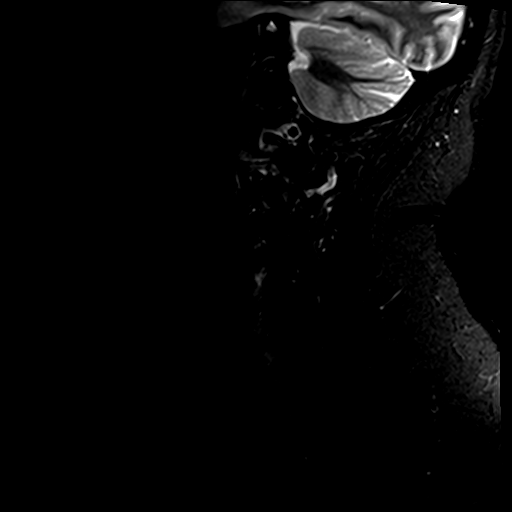
[im 3/13]
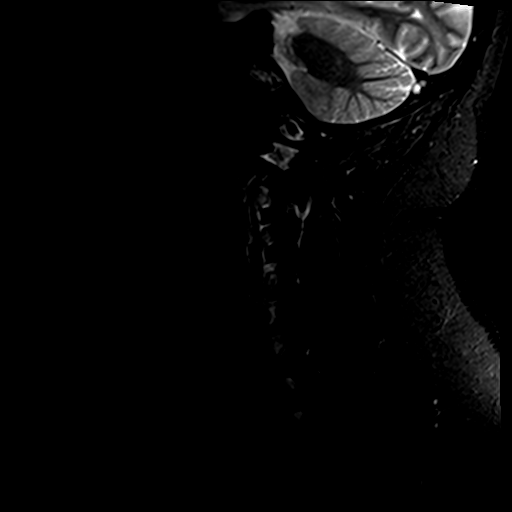
[im 5/13]
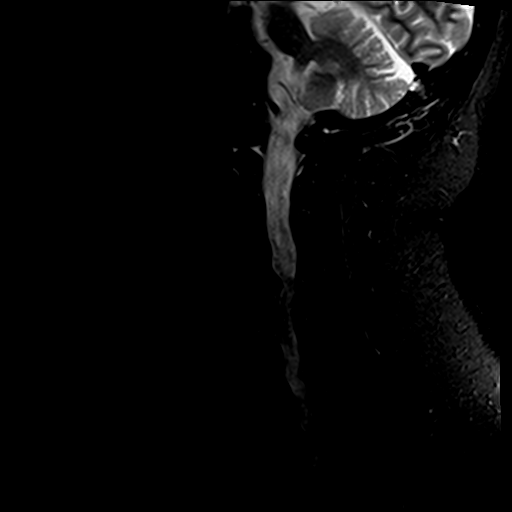
[im 7/13]
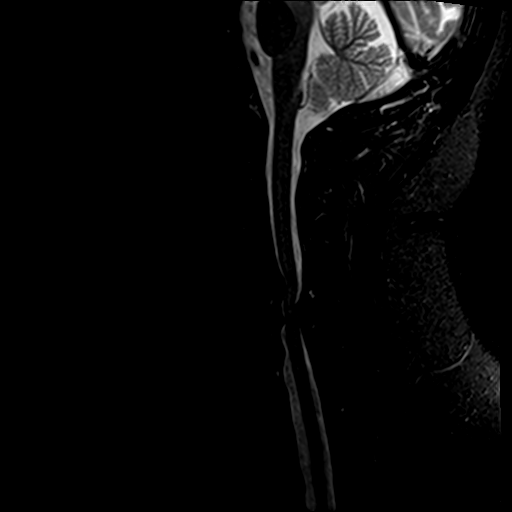
[im 9/13]
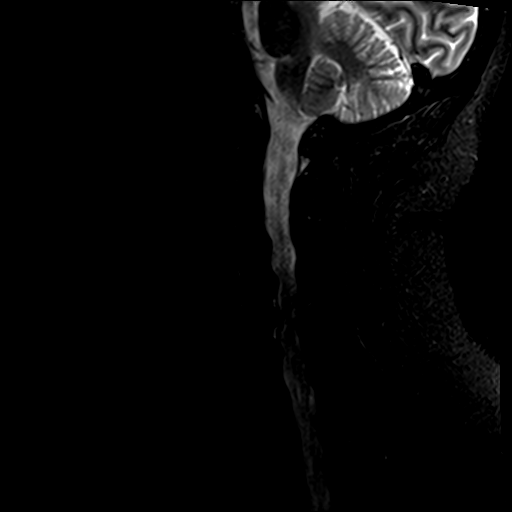
[im 11/13]
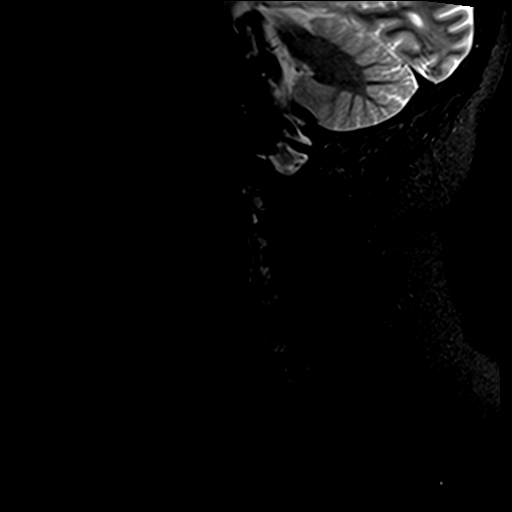

[Series 7: T2 · axial · 3.0mm · 0.70mm/px · z∈[-79,+20]mm · 8 of 28 slices shown (2 of 2)]
[im 1/28]
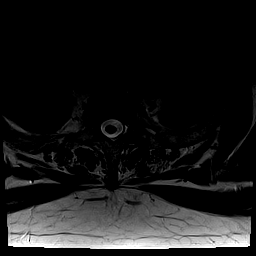
[im 5/28]
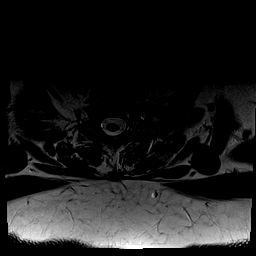
[im 9/28]
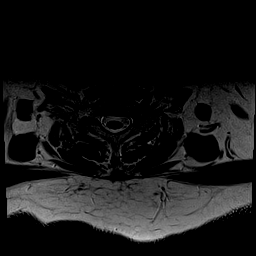
[im 13/28]
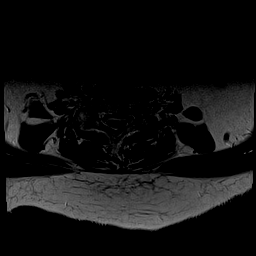
[im 15/28]
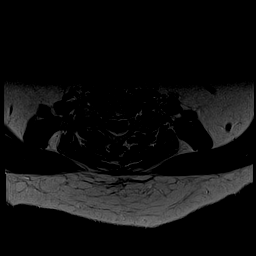
[im 19/28]
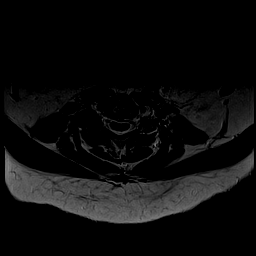
[im 23/28]
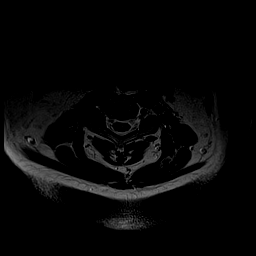
[im 28/28]
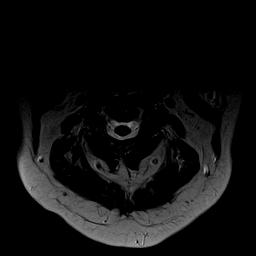

[27 of 48 positions shown; findings below may reference images not displayed]

FINDINGS: The cervical cord is normal in size and signal. Vertebral body
heights are maintained. There is degenerative disc disease at C5-6
and C6-7 with disc height loss. The cervical spine is normal in
lordotic alignment. No static listhesis. Bone marrow signal is
normal. Cerebellar tonsils are normal in position.

C2-3: No significant disc bulge. No neural foraminal stenosis. No
central canal stenosis.

C3-4: No significant disc bulge. Mild right facet arthropathy
uncovertebral degenerative change resulting in mild right foraminal
stenosis. No left foraminal stenosis. No central canal stenosis.

C4-5: Broad central disc protrusion deforming the ventral cervical
spinal cord. Mild right facet arthropathy. No neural foraminal
stenosis. Central canal narrowing.

C5-6: Moderate broad-based disc bulge effacing the ventral CSF space
and flattening the ventral cervical spinal cord. Bilateral
uncovertebral degenerative changes resulting in severe right and
moderate left foraminal stenosis. Central canal stenosis.

C6-7: Mild broad-based disc osteophyte complex. Mild bilateral facet
arthropathy. No neural foraminal stenosis. No central canal
stenosis.

C7-T1: No significant disc bulge. No neural foraminal stenosis. No
central canal stenosis.
IMPRESSION: 1. At C4-5 there is a broad central disc protrusion deforming the
ventral cervical spinal cord. Mild right facet arthropathy. Central
canal narrowing.
2. At C5-6 there is a moderate broad-based disc bulge effacing the
ventral CSF space and flattening the ventral cervical spinal cord.
Bilateral uncovertebral degenerative changes resulting in severe
right and moderate left foraminal stenosis. Central canal stenosis.

## 2017-12-17 ENCOUNTER — Other Ambulatory Visit: Payer: Self-pay | Admitting: Physician Assistant

## 2017-12-17 DIAGNOSIS — Z1231 Encounter for screening mammogram for malignant neoplasm of breast: Secondary | ICD-10-CM

## 2018-01-15 ENCOUNTER — Ambulatory Visit
Admission: RE | Admit: 2018-01-15 | Discharge: 2018-01-15 | Disposition: A | Discharge status: Home / Self Care | Payer: Medicare Other | Source: Ambulatory Visit | Attending: Physician Assistant | Admitting: Physician Assistant

## 2018-01-15 DIAGNOSIS — Z1231 Encounter for screening mammogram for malignant neoplasm of breast: Secondary | ICD-10-CM | POA: Diagnosis not present

## 2018-06-13 ENCOUNTER — Telehealth: Payer: Self-pay

## 2018-06-13 NOTE — Telephone Encounter (Signed)
Called patient from recall.  No answer. LMOV.  This is the second attempt per recall list.   

## 2018-06-25 ENCOUNTER — Telehealth: Payer: Self-pay | Admitting: Cardiovascular Disease

## 2018-06-25 NOTE — Telephone Encounter (Signed)
Virtual Visit Pre-Appointment Phone Call  "(Name), I am calling you today to discuss your upcoming appointment. We are currently trying to limit exposure to the virus that causes COVID-19 by seeing patients at home rather than in the office."  1. "What is the BEST phone number to call the day of the visit?" - include this in appointment notes  2. "Do you have or have access to (through a family member/friend) a smartphone with video capability that we can use for your visit?" a. If yes - list this number in appt notes as "cell" (if different from BEST phone #) and list the appointment type as a VIDEO visit in appointment notes b. If no - list the appointment type as a PHONE visit in appointment notes  3. Confirm consent - "In the setting of the current Covid19 crisis, you are scheduled for a (phone or video) visit with your provider on (date) at (time).  Just as we do with many in-office visits, in order for you to participate in this visit, we must obtain consent.  If you'd like, I can send this to your mychart (if signed up) or email for you to review.  Otherwise, I can obtain your verbal consent now.  All virtual visits are billed to your insurance company just like a normal visit would be.  By agreeing to a virtual visit, we'd like you to understand that the technology does not allow for your provider to perform an examination, and thus may limit your provider's ability to fully assess your condition. If your provider identifies any concerns that need to be evaluated in person, we will make arrangements to do so.  Finally, though the technology is pretty good, we cannot assure that it will always work on either your or our end, and in the setting of a video visit, we may have to convert it to a phone-only visit.  In either situation, we cannot ensure that we have a secure connection.  Are you willing to proceed?" STAFF: Did the patient verbally acknowledge consent to telehealth visit? Document  YES/NO here: YES  4. Advise patient to be prepared - "Two hours prior to your appointment, go ahead and check your blood pressure, pulse, oxygen saturation, and your weight (if you have the equipment to check those) and write them all down. When your visit starts, your provider will ask you for this information. If you have an Apple Watch or Kardia device, please plan to have heart rate information ready on the day of your appointment. Please have a pen and paper handy nearby the day of the visit as well."  5. Give patient instructions for MyChart download to smartphone OR [email or web address] as below if video visit (depending on what platform provider is using)  6. Inform patient they will receive a phone call 15 minutes prior to their appointment time (may be from unknown caller ID) so they should be prepared to answer    TELEPHONE CALL NOTE  Monica BalboaMary W Ray has been deemed a candidate for a follow-up tele-health visit to limit community exposure during the Covid-19 pandemic. I spoke with the patient via phone to ensure availability of phone/video source, confirm preferred email & phone number, and discuss instructions and expectations.  I reminded Monica Ray to be prepared with any vital sign and/or heart rhythm information that could potentially be obtained via home monitoring, at the time of her visit. I reminded Monica Ray to expect a phone call prior to  her visit.  Sandre Kitty 06/25/2018 2:10 PM   INSTRUCTIONS FOR DOWNLOADING THE MYCHART APP TO SMARTPHONE  - The patient must first make sure to have activated MyChart and know their login information - If Apple, go to [employer] and type in MyChart in the search bar and download the app. If Android, ask patient to go to [employer] and type in [place] in the search bar and download the app. The app is free but as with any other app downloads, their phone may require them to verify saved payment information or Apple/Android  password.  - The patient will need to then log into the app with their MyChart username and password, and select [facility] as their healthcare provider to link the account. When it is time for your visit, go to the MyChart app, find appointments, and click Begin Video Visit. Be sure to Select Allow for your device to access the Microphone and Camera for your visit. You will then be connected, and your provider will be with you shortly.  **If they have any issues connecting, or need assistance please contact MyChart service desk [phone])**  **If using a computer, in order to ensure the best quality for their visit they will need to use either of the following Internet Browsers: [employer] or Google Chrome**  IF USING DOXIMITY or DOXY.ME - The patient will receive a link just prior to their visit by text.     FULL LENGTH CONSENT FOR TELE-HEALTH VISIT   I hereby voluntarily request, consent and authorize [facility] and its employed or contracted physicians, physician assistants, nurse practitioners or other licensed health care professionals (the Practitioner), to provide me with telemedicine health care services (the "Services") as deemed necessary by the treating Practitioner. I acknowledge and consent to receive the Services by the Practitioner via telemedicine. I understand that the telemedicine visit will involve communicating with the Practitioner through live audiovisual communication technology and the disclosure of certain medical information by electronic transmission. I acknowledge that I have been given the opportunity to request an in-person assessment or other available alternative prior to the telemedicine visit and am voluntarily participating in the telemedicine visit.  I understand that I have the right to withhold or withdraw my consent to the use of telemedicine in the course of my care at any time, without affecting my right to future care or treatment,  and that the Practitioner or I may terminate the telemedicine visit at any time. I understand that I have the right to inspect all information obtained and/or recorded in the course of the telemedicine visit and may receive copies of available information for a reasonable fee.  I understand that some of the potential risks of receiving the Services via telemedicine include:  Marland Kitchen Delay or interruption in medical evaluation due to technological equipment failure or disruption; . Information transmitted may not be sufficient (e.g. poor resolution of images) to allow for appropriate medical decision making by the Practitioner; and/or  . In rare instances, security protocols could fail, causing a breach of personal health information.  Furthermore, I acknowledge that it is my responsibility to provide information about my medical history, conditions and care that is complete and accurate to the best of my ability. I acknowledge that Practitioner's advice, recommendations, and/or decision may be based on factors not within their control, such as incomplete or inaccurate data provided by me or distortions of diagnostic images or specimens that may result from electronic transmissions. I understand  that the practice of medicine is not an exact science and that Practitioner makes no warranties or guarantees regarding treatment outcomes. I acknowledge that I will receive a copy of this consent concurrently upon execution via email to the email address I last provided but may also request a printed copy by calling the office of [facility].    I understand that my insurance will be billed for this visit.   I have read or had this consent read to me. . I understand the contents of this consent, which adequately explains the benefits and risks of the Services being provided via telemedicine.  . I have been provided ample opportunity to ask questions regarding this consent and the Services and have had my questions  answered to my satisfaction. . I give my informed consent for the services to be provided through the use of telemedicine in my medical care  By participating in this telemedicine visit I agree to the above.

## 2018-06-27 ENCOUNTER — Encounter: Payer: Self-pay | Admitting: Cardiovascular Disease

## 2018-06-27 ENCOUNTER — Telehealth (INDEPENDENT_AMBULATORY_CARE_PROVIDER_SITE_OTHER): Payer: Medicare Other | Admitting: Cardiovascular Disease

## 2018-06-27 ENCOUNTER — Other Ambulatory Visit: Payer: Self-pay

## 2018-06-27 VITALS — BP 132/66 | HR 57 | Ht 64.0 in | Wt 242.0 lb

## 2018-06-27 DIAGNOSIS — I5032 Chronic diastolic (congestive) heart failure: Secondary | ICD-10-CM | POA: Diagnosis not present

## 2018-06-27 DIAGNOSIS — I10 Essential (primary) hypertension: Secondary | ICD-10-CM

## 2018-06-27 NOTE — Patient Instructions (Signed)
Medication Instructions:  Continue same medications If you need a refill on your cardiac medications before your next appointment, please call your pharmacy.   Lab work: None If you have labs (blood work) drawn today and your tests are completely normal, you will receive your results only by: . MyChart Message (if you have MyChart) OR . A paper copy in the mail If you have any lab test that is abnormal or we need to change your treatment, we will call you to review the results.  Testing/Procedures: None  Follow-Up: At [facility], you and your health needs are our priority.  As part of our continuing mission to provide you with exceptional heart care, we have created designated Provider Care Teams.  These Care Teams include your primary Cardiologist (physician) and Advanced Practice Providers (APPs -  Physician Assistants and Nurse Practitioners) who all work together to provide you with the care you need, when you need it. You will need a follow up appointment in 1 years.  Please call our office 2 months in advance to schedule this appointment.  You may see Chad Donoghue, MD or one of the following Advanced Practice Providers on your designated Care Team:   Christopher Berge, NP Ryan Dunn, PA-C . Jacquelyn Visser, PA-C    

## 2018-06-27 NOTE — Progress Notes (Signed)
Virtual Visit via Video Note   This visit type was conducted due to national recommendations for restrictions regarding the COVID-19 Pandemic (e.g. social distancing) in an effort to limit this patient's exposure and mitigate transmission in our community.  Due to her co-morbid illnesses, this patient is at least at moderate risk for complications without adequate follow up.  This format is felt to be most appropriate for this patient at this time.  All issues noted in this document were discussed and addressed.  A limited physical exam was performed with this format.  Please refer to the patient's chart for her consent to telehealth for [facility].   Date:  06/27/2018   ID:  Monica Ray, DOB 04-10-1945, MRN [id number]  Patient Location: Home Provider Location: Office  PCP:  Patrice Paradise, MD  Cardiologist:  Lorine Bears, MD  Electrophysiologist:  None   Evaluation Performed:  Follow-Up Visit  Chief Complaint: Chronic low back pain.  History of Present Illness:    Monica Ray is a 73 y.o. female who was seen via video visit today for follow-up regarding chronic diastolic heart failure. She has multiple chronic medical conditions that include prolonged history of type 2 diabetes , essential hypertension, sleep apnea with poor intolerance to CPAP, hyperlipidemia and obesity. She is a lifelong nonsmoker. There is no family history of coronary artery disease but there is family history of cancer. She had previous back surgery. She was evaluated in 2017 for significant exertional dyspnea. A right and left cardiac catheterization in November 2017 showed normal LV systolic function with mildly elevated left ventricular end-diastolic pressure and mild pulmonary hypertension. There was no evidence of obstructive coronary artery disease. Pulmonary pressure was 37/17 with a mean of 25 mmHg. LVEDP was 18 mmHg. She has been stable from a cardiac standpoint with no recent chest pain,  leg edema or worsening dyspnea.  No orthopnea.  She continues to take furosemide 40 mg once daily. Her biggest issue continues to be uncontrolled diabetes and chronic back pain which limits her physical activities.   The patient does not have symptoms concerning for COVID-19 infection (fever, chills, cough, or new shortness of breath).    Past Medical History:  Diagnosis Date  . Anemia    before hysterectomy  . Depression   . Diabetes mellitus, type 2 (HCC)   . DM retinopathy (HCC)   . Edema   . Hx of phlebitis   . Hyperlipidemia   . Hypertension   . Mild allergic rhinitis   . MRSA (methicillin resistant staph aureus) culture positive 03/2006  . OA (osteoarthritis)    hands  . Sleep apnea    Past Surgical History:  Procedure Laterality Date  . ABDOMINAL HYSTERECTOMY    . ADENOIDECTOMY    . APPENDECTOMY  1989  . barium enema  1998   neg CT  . CARDIAC CATHETERIZATION Bilateral 12/08/2015   Procedure: Right/Left Heart Cath and Coronary Angiography;  Surgeon: Iran Ouch, MD;  Location: [facility] INVASIVE CV LAB;  Service: Cardiovascular;  Laterality: Bilateral;  . CHOLECYSTECTOMY  1989  . LUMBAR LAMINECTOMY/DECOMPRESSION MICRODISCECTOMY N/A 11/25/2014   Procedure: LUMBAR LAMINECTOMY/DECOMPRESSION MICRODISCECTOMY LUMBAR TWO-THREE, LUMBAR THREE-FOUR ;  Surgeon: Tressie Stalker, MD;  Location: [facility];  Service: Neurosurgery;  Laterality: N/A;  . ROTATOR CUFF REPAIR  08/2001   right     Current Meds  Medication Sig  . Cholecalciferol (VITAMIN D3) 125 MCG (5000 UT) TABS Take by mouth.  . clotrimazole-betamethasone (LOTRISONE) cream  Apply 1 application topically 2 (two) times daily.  Marland Kitchen diltiazem (CARDIZEM CD) 120 MG 24 hr capsule Take 120 mg by mouth daily.   . DULoxetine (CYMBALTA) 60 MG capsule Take 60 mg by mouth daily.  . furosemide (LASIX) 40 MG tablet TAKE 1 TABLET BY MOUTH ONCE A DAY  . insulin detemir (LEVEMIR) 100 UNIT/ML injection Inject 50-64 Units into the skin  2 (two) times daily. 40 units in the morning and 65 units in the evening  . insulin lispro (HUMALOG) 100 UNIT/ML injection Inject 20-26 Units into the skin 4 (four) times daily - after meals and at bedtime. 24 units before breakfast and lunch and 28 units before supper - plus sliding scale (4 units per 50 over a target of 150)  . irbesartan (AVAPRO) 300 MG tablet Take 300 mg by mouth daily.   . meloxicam (MOBIC) 7.5 MG tablet TAKE ONE CAPSULE BY MOUTH DAILY  . pioglitazone (ACTOS) 45 MG tablet Take 45 mg by mouth daily.  . rosuvastatin (CRESTOR) 20 MG tablet Take 10 mg by mouth daily.      Allergies:   Tegaderm ag mesh [silver]; Amoxicillin; Metformin; Azithromycin; Clindamycin; Fluconazole; Oxycodone-acetaminophen; and Simvastatin   Social History   Tobacco Use  . Smoking status: Never Smoker  . Smokeless tobacco: Never Used  Substance Use Topics  . Alcohol use: No  . Drug use: No     Family Hx: The patient's family history includes Alcohol abuse in her father and mother; Aneurysm in her father and paternal grandmother; Breast cancer in her paternal aunt; Cancer in her father; Cancer (age of onset: 38) in her mother; Diabetes in her maternal grandmother and mother.  ROS:   Please see the history of present illness.     All other systems reviewed and are negative.   Prior CV studies:   The following studies were reviewed today:    Labs/Other Tests and Data Reviewed:    EKG:  No ECG reviewed.  Recent Labs: No results found for requested labs within last 8760 hours.   Recent Lipid Panel Lab Results  Component Value Date/Time   CHOL 125 10/07/2013 08:12 AM   TRIG 103.0 10/07/2013 08:12 AM   HDL 33.90 (L) 10/07/2013 08:12 AM   CHOLHDL 4 10/07/2013 08:12 AM   LDLCALC 71 10/07/2013 08:12 AM    Wt Readings from Last 3 Encounters:  06/27/18 242 lb (109.8 kg)  03/12/16 256 lb 4 oz (116.2 kg)  02/27/16 256 lb 1.6 oz (116.2 kg)     Objective:    Vital Signs:  BP 132/66    Pulse (!) 57   Ht  (1.626 m)   Wt 242 lb (109.8 kg)   BMI 41.54 kg/m    VITAL SIGNS:  reviewed GEN:  no acute distress EYES:  sclerae anicteric, EOMI - Extraocular Movements Intact RESPIRATORY:  normal respiratory effort, symmetric expansion SKIN:  no rash, lesions or ulcers. MUSCULOSKELETAL:  no obvious deformities. NEURO:  alert and oriented x 3, no obvious focal deficit PSYCH:  normal affect  ASSESSMENT & PLAN:    1.   Chronic diastolic heart failure: She seems to be euvolemic on current dose of furosemide based on symptoms and weight trend.  Continue same treatment.  2. Essential hypertension: Blood pressure is controlled on current medications.  3. Hyperlipidemia: Continue treatment with rosuvastatin.  I reviewed most recent lipid profile from February which showed an LDL of 80 and triglyceride of 136.  4. Diabetes mellitus: Given mild  heart failure, Consider decreasing the dose of Actos or switching to a different medication.  COVID-19 Education: The signs and symptoms of COVID-19 were discussed with the patient and how to seek care for testing (follow up with PCP or arrange E-visit).  The importance of social distancing was discussed today.  Time:   Today, I have spent 13 minutes with the patient with telehealth technology discussing the above problems.     Medication Adjustments/Labs and Tests Ordered: Current medicines are reviewed at length with the patient today.  Concerns regarding medicines are outlined above.   Tests Ordered: No orders of the defined types were placed in this encounter.   Medication Changes: No orders of the defined types were placed in this encounter.   Disposition:  Follow up in 1 year(s)  Signed, Lorine BearsMuhammad Laressa Bolinger, MD  06/27/2018 10:20 AM    [facility]

## 2018-08-07 ENCOUNTER — Ambulatory Visit
Admission: RE | Admit: 2018-08-07 | Discharge: 2018-08-07 | Disposition: A | Discharge status: Home / Self Care | Payer: Medicare Other | Source: Ambulatory Visit | Attending: Physician Assistant | Admitting: Physician Assistant

## 2018-08-07 ENCOUNTER — Encounter: Payer: Self-pay | Admitting: Cardiovascular Disease

## 2018-08-07 ENCOUNTER — Other Ambulatory Visit: Payer: Self-pay | Admitting: Physician Assistant

## 2018-08-07 ENCOUNTER — Other Ambulatory Visit
Admission: RE | Admit: 2018-08-07 | Discharge: 2018-08-07 | Disposition: A | Discharge status: Home / Self Care | Payer: Medicare Other | Source: Ambulatory Visit | Attending: Physician Assistant | Admitting: Physician Assistant

## 2018-08-07 ENCOUNTER — Other Ambulatory Visit: Payer: Self-pay

## 2018-08-07 DIAGNOSIS — R071 Chest pain on breathing: Secondary | ICD-10-CM | POA: Insufficient documentation

## 2018-08-07 DIAGNOSIS — R0602 Shortness of breath: Secondary | ICD-10-CM | POA: Insufficient documentation

## 2018-08-07 DIAGNOSIS — R7989 Other specified abnormal findings of blood chemistry: Secondary | ICD-10-CM

## 2018-08-07 LAB — TROPONIN I (HIGH SENSITIVITY): Troponin I (High Sensitivity): 11 ng/L (ref <18)

## 2018-08-07 LAB — FIBRIN DERIVATIVES D-DIMER (ARMC ONLY): Fibrin derivatives D-dimer (ARMC): 1451.71 ng/mL (FEU) — ABNORMAL HIGH (ref 0.00–499.00)

## 2018-08-07 MED ORDER — IOHEXOL 350 MG/ML SOLN
75.0000 mL | Freq: Once | INTRAVENOUS | Status: AC | PRN
  Administered 2018-08-07: 75 mL via INTRAVENOUS

## 2018-08-11 NOTE — Progress Notes (Signed)
Cardiology Office Note Date:  08/13/2018  Patient ID:  Monica Ray, DOB 05/07/1945, MRN [id number] PCP:  Patrice ParadiseMcLaughlin, Miriam K, MD  Cardiologist:  Dr. Kirke CorinArida, MD    Chief Complaint: Chest pain/SOB  History of Present Illness: Monica Ray is a 73 y.o. female with history of nonobstructive CAD by LHC in 2017, mild pulmonary hypertension, uncontrolled DM2, HTN, HLD, medical noncompliance, obesity, and OSA noncompliant with CPAP who presents for evaluation of chest pain and SOB.   She [facility] showed no evidence of obstructive CAD with mild luminal irregularities involving the LAD which was mildly calcified, small LCx with territory supplied by posterolateral branches of the RCA, and angiographically normal RCA with normal LSVF and mildly elevated LVEDP of 18 mmHg. RHC showed mild pulmonary hypertension with a pulmonary pressure of 37/17 with a mean of 25 mmHg. Her cath findings did not explain her symptoms. She was most recently seen in telehealth follow up on 06/27/2018 and was stable from a cardiac perspective. Her biggest issues at that time were uncontrolled diabetes and back pain.   Patient was seen by PCP on 08/07/2018 noting chest discomfort that radiated to her neck with associated SOB with her family suspecting panic attack on 7/1. She was given Xanax. PCP note indicates the patient has not been eating well and was feeling overall fatigued and generally poor. She was noted to be noncompliant with her medications as well as her CPAP. Documented EKG from that visit showed sinus bradycardia, 54 bpm, no acute ST-T changes. Hs-Tn was normal at 11, delta troponin not performed. D-dimer was elevated at 1451.71 with CTA chest showing no evidence for PE with scattered atherosclerotic calcifications involving the thoracic aorta without aneurysm or dissection, 3-vessel coronary artery calcification, patchy ground-glass opacity felt to represent small airway  disease, no focal infiltrates or effusions. WBC count 8.4, HGB 12.0, PLT 221, sodium 132, glucose 430 with corrected sodium of 140, potassium 4.6, SCr 1.2, AST/ALT normal, albumin 3.3.  Patient comes in today noting she has not had any further chest pain or shortness of breath since her episode on 7/1.  However, she does note some increased fatigue which she has attributed to her morbid obesity, physical deconditioning, and chronic back pain.  She feels strongly that her episode on 7/1 was related to anxiety/panic attack given symptoms improved with Xanax.  She denies any associated palpitations, dizziness, presyncope, syncope, lower extremity swelling, abdominal distention, orthopnea, PND, early satiety.  No falls, BRBPR, melena.  Patient states shortness of breath was similar to her presentation in 2017 with cardiac catheterization revealing nonobstructive disease and mild pulmonary hypertension.  With weight loss her symptoms significantly improved, however over the past several months her weight has again trended back up and she wonders if this too is playing a role.  Currently symptom-free.   Past Medical History:  Diagnosis Date  . Anemia    before hysterectomy  . Depression   . Diabetes mellitus, type 2 (HCC)   . DM retinopathy (HCC)   . Edema   . Hx of phlebitis   . Hyperlipidemia   . Hypertension   . Mild allergic rhinitis   . MRSA (methicillin resistant staph aureus) culture positive 03/2006  . OA (osteoarthritis)    hands  . Sleep apnea     Past Surgical History:  Procedure Laterality Date  . ABDOMINAL HYSTERECTOMY    . ADENOIDECTOMY    . APPENDECTOMY  1989  .  barium enema  1998   neg CT  . CARDIAC CATHETERIZATION Bilateral 12/08/2015   Procedure: Right/Left Heart Cath and Coronary Angiography;  Surgeon: Iran OuchMuhammad A Arida, MD;  Location: [facility] INVASIVE CV LAB;  Service: Cardiovascular;  Laterality: Bilateral;  . CHOLECYSTECTOMY  1989  . LUMBAR LAMINECTOMY/DECOMPRESSION  MICRODISCECTOMY N/A 11/25/2014   Procedure: LUMBAR LAMINECTOMY/DECOMPRESSION MICRODISCECTOMY LUMBAR TWO-THREE, LUMBAR THREE-FOUR ;  Surgeon: Tressie StalkerJeffrey Jenkins, MD;  Location: [facility];  Service: Neurosurgery;  Laterality: N/A;  . ROTATOR CUFF REPAIR  08/2001   right    Current Meds  Medication Sig  . Cholecalciferol (VITAMIN D3) 125 MCG (5000 UT) TABS Take by mouth daily.   . clotrimazole-betamethasone (LOTRISONE) cream Apply 1 application topically 2 (two) times daily.  . Cyanocobalamin (VITAMIN B12) 3000 MCG SUBL Take 3,000 mg by mouth daily.  Marland Kitchen. diltiazem (CARDIZEM CD) 120 MG 24 hr capsule Take 120 mg by mouth daily.   . DULoxetine (CYMBALTA) 60 MG capsule Take 60 mg by mouth daily.  . furosemide (LASIX) 40 MG tablet TAKE 1 TABLET BY MOUTH ONCE A DAY  . insulin detemir (LEVEMIR) 100 UNIT/ML injection Inject 50-64 Units into the skin 2 (two) times daily. 40 units in the morning and 65 units in the evening  . insulin lispro (HUMALOG) 100 UNIT/ML injection Inject 20-26 Units into the skin 4 (four) times daily - after meals and at bedtime. 24 units before breakfast and lunch and 28 units before supper - plus sliding scale (4 units per 50 over a target of 150)  . irbesartan (AVAPRO) 300 MG tablet Take 300 mg by mouth daily.   . meloxicam (MOBIC) 7.5 MG tablet TAKE ONE CAPSULE BY MOUTH DAILY  . pioglitazone (ACTOS) 45 MG tablet Take 45 mg by mouth daily.  . rosuvastatin (CRESTOR) 20 MG tablet Take 10 mg by mouth daily.     Allergies:   Tegaderm ag mesh [silver], Amoxicillin, Metformin, Azithromycin, Clindamycin, Fluconazole, Oxycodone-acetaminophen, and Simvastatin   Social History:  The patient  reports that she has never smoked. She has never used smokeless tobacco. She reports that she does not drink alcohol or use drugs.   Family History:  The patient's family history includes Alcohol abuse in her father and mother; Aneurysm in her father and paternal grandmother; Breast cancer in her  paternal aunt; Cancer in her father; Cancer (age of onset: 3485) in her mother; Diabetes in her maternal grandmother and mother.  ROS:   Review of Systems  Constitutional: Positive for malaise/fatigue. Negative for chills, diaphoresis, fever and weight loss.  HENT: Negative for congestion.   Eyes: Negative for discharge and redness.  Respiratory: Positive for shortness of breath. Negative for cough, hemoptysis, sputum production and wheezing.   Cardiovascular: Positive for chest pain. Negative for palpitations, orthopnea, claudication, leg swelling and PND.  Gastrointestinal: Negative for abdominal pain, blood in stool, heartburn, melena, nausea and vomiting.  Genitourinary: Negative for hematuria.  Musculoskeletal: Negative for falls and myalgias.  Skin: Negative for rash.  Neurological: Positive for weakness. Negative for dizziness, tingling, tremors, sensory change, speech change, focal weakness and loss of consciousness.  Endo/Heme/Allergies: Does not bruise/bleed easily.  Psychiatric/Behavioral: Negative for substance abuse. The patient is nervous/anxious.   All other systems reviewed and are negative.    PHYSICAL EXAM:  VS:  BP 128/60 (BP Location: Right Arm, Patient Position: Sitting, Cuff Size: Large)   Pulse (!) 59   Temp (!) 96.7 F (35.9 C)   Ht 5\' 4"  (1.626 m)   Wt 247  lb 8 oz (112.3 kg)   SpO2 97%   BMI 42.48 kg/m  BMI: Body mass index is 42.48 kg/m.  Physical Exam  Constitutional: She is oriented to person, place, and time. She appears well-developed and well-nourished.  HENT:  Head: Normocephalic and atraumatic.  Eyes: Right eye exhibits no discharge. Left eye exhibits no discharge.  Neck: Normal range of motion. No JVD present.  Cardiovascular: Normal rate, regular rhythm, S1 normal, S2 normal and normal heart sounds. Exam reveals no distant heart sounds, no friction rub, no midsystolic click and no opening snap.  No murmur heard. Pulses:      Posterior tibial  pulses are 2+ on the right side and 2+ on the left side.  Pulmonary/Chest: Effort normal and breath sounds normal. No respiratory distress. She has no decreased breath sounds. She has no wheezes. She has no rales. She exhibits no tenderness.  Abdominal: Soft. She exhibits no distension. There is no abdominal tenderness.  Musculoskeletal:        General: No edema.  Neurological: She is alert and oriented to person, place, and time.  Skin: Skin is warm and dry. No cyanosis. Nails show no clubbing.  Psychiatric: She has a normal mood and affect. Her speech is normal and behavior is normal. Judgment and thought content normal.     EKG:  Was ordered and interpreted by me today. Shows sinus bradycardia, 59 bpm, TWI aVL  Recent Labs: No results found for requested labs within last 8760 hours.  No results found for requested labs within last 8760 hours.   CrCl cannot be calculated (Patient's most recent lab result is older than the maximum 21 days allowed.).   Wt Readings from Last 3 Encounters:  08/13/18 247 lb 8 oz (112.3 kg)  06/27/18 242 lb (109.8 kg)  03/12/16 256 lb 4 oz (116.2 kg)     Other studies reviewed: Additional studies/records reviewed today include: summarized above  ASSESSMENT AND PLAN:  1. 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  Prior cardiac cath in 12/2015 demonstrated minor luminal irregularities involving the LAD which was mildly calcified.  More recently, she was seen by PCP on 7/2 for episode of chest pain occurring on 7/1 with negative high-sensitivity troponin and elevated d-dimer with subsequent CTA chest being negative for evidence of PE with noted aortic atherosclerosis and coronary artery calcifications, specifically involving the LAD however there were also scattered calcifications involving the LCx and RCA.  This corresponds with the noted calcification of her LAD in 2017.  However, we cannot exclude  progression of potential stenosis in this area as the CT was not gated to evaluate the coronary arteries.  We have had an extensive discussion regarding invasive and noninvasive ischemic evaluation and have agreed to proceed with coronary artery CTA.  Given noted coronary artery calcification aggressive risk factor modification should be pursued.  She has suboptimally controlled diabetes and often forgets to take her medication.  I discussed with her different phone apps which would remind her to take her medications.  We should also set a goal LDL for her of less than 70 with most recent LDL from 03/2018 being 80.  In this setting, I have titrated her Crestor to 20 mg daily.  She will need a follow-up lipid and liver function in 2 months.  2. Chronic diastolic CHF/PAH: She appears relatively euvolemic and well compensated.  Her pulmonary hypertension is likely in setting of untreated sleep apnea.  She remains asymptomatic  with current dose of Lasix.  If CTA of the coronary arteries is unrevealing could consider echo to evaluate LV diastolic function and pulmonary arterial pressure.  I had an extensive conversation with the patient and her sister regarding the ramifications of untreated pulmonary hypertension.  3. Obstructive sleep apnea: She indicates prior sleep study in 2017 demonstrating sleep apnea however she is noncompliant with CPAP.  She is interested in revisiting this and will be referred to pulmonology for further discussion.  Compliance with CPAP is strongly recommended.  4. Hyperlipidemia: Most recent LDL of 80 from 03/2018 with a triglyceride of 136.  Increase Crestor to 20 mg daily.  She will need a follow-up lipid and liver function in approximately 2 months.  5. Hypertension: Blood pressure is well controlled in the office today.  No medication changes.  6. Diabetes: Poorly controlled.  Given mild heart failure would recommend decreasing the dose of Actos or changing to alternative  medication.  This will be deferred to the patient's primary care physician.  7. Morbid obesity: Weight loss is strongly advised.  With regards to her shortness of breath I suspect this is likely multifactorial including morbid obesity, physical deconditioning, diastolic CHF, and PAH.  We are evaluating for CAD as outlined above.  Disposition: F/u with Dr. Kirke CorinArida or an APP in 2 months.  Current medicines are reviewed at length with the patient today.  The patient did not have any concerns regarding medicines.  Signed, Eula Listenyan Monya Kozakiewicz, PA-C 08/13/2018 3:11 PM     [facility] [address] [place], [place] [address] [phone]

## 2018-08-13 ENCOUNTER — Ambulatory Visit (INDEPENDENT_AMBULATORY_CARE_PROVIDER_SITE_OTHER): Payer: Medicare Other | Admitting: Physician Assistant

## 2018-08-13 ENCOUNTER — Encounter: Payer: Self-pay | Admitting: Physician Assistant

## 2018-08-13 ENCOUNTER — Other Ambulatory Visit
Admission: RE | Admit: 2018-08-13 | Discharge: 2018-08-13 | Disposition: A | Discharge status: Home / Self Care | Payer: Medicare Other | Source: Ambulatory Visit | Attending: Physician Assistant | Admitting: Physician Assistant

## 2018-08-13 ENCOUNTER — Other Ambulatory Visit: Payer: Self-pay

## 2018-08-13 VITALS — BP 128/60 | HR 59 | Temp 96.7°F | Ht 64.0 in | Wt 247.5 lb

## 2018-08-13 DIAGNOSIS — E1165 Type 2 diabetes mellitus with hyperglycemia: Secondary | ICD-10-CM

## 2018-08-13 DIAGNOSIS — R0600 Dyspnea, unspecified: Secondary | ICD-10-CM

## 2018-08-13 DIAGNOSIS — G4733 Obstructive sleep apnea (adult) (pediatric): Secondary | ICD-10-CM

## 2018-08-13 DIAGNOSIS — R079 Chest pain, unspecified: Secondary | ICD-10-CM | POA: Insufficient documentation

## 2018-08-13 DIAGNOSIS — I272 Pulmonary hypertension, unspecified: Secondary | ICD-10-CM

## 2018-08-13 DIAGNOSIS — I5032 Chronic diastolic (congestive) heart failure: Secondary | ICD-10-CM | POA: Diagnosis not present

## 2018-08-13 DIAGNOSIS — I10 Essential (primary) hypertension: Secondary | ICD-10-CM

## 2018-08-13 DIAGNOSIS — I2510 Atherosclerotic heart disease of native coronary artery without angina pectoris: Secondary | ICD-10-CM

## 2018-08-13 DIAGNOSIS — I209 Angina pectoris, unspecified: Secondary | ICD-10-CM

## 2018-08-13 LAB — BASIC METABOLIC PANEL
Anion gap: 9 (ref 5–15)
BUN: 32 mg/dL — ABNORMAL HIGH (ref 8–23)
CO2: 27 mmol/L (ref 22–32)
Calcium: 9.6 mg/dL (ref 8.9–10.3)
Chloride: 106 mmol/L (ref 98–111)
Creatinine, Ser: 1.25 mg/dL — ABNORMAL HIGH (ref 0.44–1.00)
GFR calc Af Amer: 50 mL/min — ABNORMAL LOW (ref >60)
GFR calc non Af Amer: 43 mL/min — ABNORMAL LOW (ref >60)
Glucose, Bld: 69 mg/dL — ABNORMAL LOW (ref 70–99)
Potassium: 4.8 mmol/L (ref 3.5–5.1)
Sodium: 142 mmol/L (ref 135–145)

## 2018-08-13 LAB — CBC
HCT: 39.1 % (ref 36.0–46.0)
Hemoglobin: 12.4 g/dL (ref 12.0–15.0)
MCH: 31.3 pg (ref 26.0–34.0)
MCHC: 31.7 g/dL (ref 30.0–36.0)
MCV: 98.7 fL (ref 80.0–100.0)
Platelets: 341 10*3/uL (ref 150–400)
RBC: 3.96 MIL/uL (ref 3.87–5.11)
RDW: 13.2 % (ref 11.5–15.5)
WBC: 7.6 10*3/uL (ref 4.0–10.5)
nRBC: 0 % (ref 0.0–0.2)

## 2018-08-13 NOTE — Patient Instructions (Signed)
Medication Instructions:  Your physician has recommended you make the following change in your medication:  1- INCREASE Crestor to 1 tablet ( 20 mg total) once daily  If you need a refill on your cardiac medications before your next appointment, please call your pharmacy.   Lab work: Your physician recommends that you have lab work today(CBC, BMET)  If you have labs (blood work) drawn today and your tests are completely normal, you will receive your results only by: Marland Kitchen MyChart Message (if you have MyChart) OR . A paper copy in the mail If you have any lab test that is abnormal or we need to change your treatment, we will call you to review the results.  Testing/Procedures: 1- Cardiac CT- You will be called for scheduling.  Please follow these instructions carefully (unless otherwise directed):  Hold all erectile dysfunction medications at least 48 hours prior to test.  On the Night Before the Test: . Be sure to Drink plenty of water. . Do not consume any caffeinated/decaffeinated beverages or chocolate 12 hours prior to your test. . Do not take any antihistamines 12 hours prior to your test. . If you take Metformin do not take 24 hours prior to test. On the Day of the Test: . Drink plenty of water. Do not drink any water within one hour of the test. . Do not eat any food 4 hours prior to the test.  . HOLD Furosemide/Hydrochlorothiazide morning of the test. . Hold insulin the morning of the test and take 1/2 dose of long acting insulin the evening prior.        After the Test: . Drink plenty of water. . After receiving IV contrast, you may experience a mild flushed feeling. This is normal. . On occasion, you may experience a mild rash up to 24 hours after the test. This is not dangerous. If this occurs, you can take Benadryl 25 mg and increase your fluid intake. . If you experience trouble breathing, this can be serious. If it is severe call 911 IMMEDIATELY. If it is mild, please  call our office. . If you take any of these medications: Glipizide/Metformin, Avandament, Glucavance, please do not take 48 hours after completing test.   Follow-Up: At [facility], you and your health needs are our priority.  As part of our continuing mission to provide you with exceptional heart care, we have created designated Provider Care Teams.  These Care Teams include your primary Cardiologist (physician) and Advanced Practice Providers (APPs -  Physician Assistants and Nurse Practitioners) who all work together to provide you with the care you need, when you need it. You will need a follow up appointment in 2 months.  You may see Kathlyn Sacramento, MD or Christell Faith, PA-C.   Any Other Special Instructions Will Be Listed Below (If Applicable). Ref to Pulmonology [phone] [facility] Pulmonology

## 2018-08-14 NOTE — Addendum Note (Signed)
Addended by: Verlon Au on: 08/14/2018 04:32 PM   Modules accepted: Orders

## 2018-08-18 ENCOUNTER — Telehealth: Payer: Self-pay | Admitting: Cardiovascular Disease

## 2018-08-18 DIAGNOSIS — E7800 Pure hypercholesterolemia, unspecified: Secondary | ICD-10-CM

## 2018-08-18 MED ORDER — ROSUVASTATIN CALCIUM 40 MG PO TABS: 40.0000 mg | ORAL_TABLET | Freq: Every day | ORAL | 2 refills | Status: DC

## 2018-08-18 NOTE — Telephone Encounter (Signed)
Patient calling to discuss recent lab testing results  ° °Please call  ° °

## 2018-08-18 NOTE — Telephone Encounter (Signed)
Pt aware of lab results and while giving results . Pt states she has been already taking Crestor 20 mg and has been for quite some time and also pt states her Cardiac CT had been cx due to BMI to high and needs to have done at [facility] Per pt has not received f/u in regards to rescheduling this .

## 2018-08-18 NOTE — Telephone Encounter (Signed)
When we reviewed the patient's medications at her in person visit she indicated she was taking Crestor 10 mg daily.  If she in fact has already been taking Crestor 20 mg daily I recommend she increase this to 40 mg daily.  Follow-up fasting lipid and liver function in 8 weeks.  Triage, please reach out to coronary CTA scheduling for study in [place].

## 2018-08-18 NOTE — Telephone Encounter (Signed)
Spoke with the pt. Pt confirms that she was already taking Crestor 20mg  daily previous to her recent o/v. Pt is agreeable with the increase. Rx sent to the pt pharmacy. Pt adv that she will need fasting lab (lipid,lft) in 8 weeks. Lab orders in Epic. Pt will have labwork at [facility]. Adv the pt that I will send a message to our Cor CT scheduler in [facility] and call back with the update. Pt verbalized understanding.

## 2018-08-19 NOTE — Telephone Encounter (Signed)
Trevor Mace, RN        Scheduled for 08-27-18 @ 1:30 @ [facility]  she is aware

## 2018-08-20 ENCOUNTER — Inpatient Hospital Stay: Admission: RE | Admit: 2018-08-20 | Payer: Medicare Other | Source: Ambulatory Visit

## 2018-08-26 ENCOUNTER — Other Ambulatory Visit (HOSPITAL_COMMUNITY): Payer: Self-pay | Admitting: Emergency Medicine

## 2018-08-26 ENCOUNTER — Telehealth (HOSPITAL_COMMUNITY): Payer: Self-pay | Admitting: Emergency Medicine

## 2018-08-26 DIAGNOSIS — I2510 Atherosclerotic heart disease of native coronary artery without angina pectoris: Secondary | ICD-10-CM

## 2018-08-26 NOTE — Telephone Encounter (Signed)
mychart message sent

## 2018-08-26 NOTE — Progress Notes (Signed)
error 

## 2018-08-26 NOTE — Telephone Encounter (Signed)
Calling to inform patient that due to her latest kidney function labs, she will require a bicarb infusion prior to her CCTA. Unfortunately patient could not accommodate but is willing to reschedule to next week 09/02/18 at 1:30p. Smith Robert notified of same.   Will call patient closer to her appt to review CCTA instructions   Marchia Bond RN Navigator Cardiac [facility] [phone] Office  [phone] Cell

## 2018-08-27 ENCOUNTER — Ambulatory Visit (HOSPITAL_COMMUNITY): Payer: Medicare Other

## 2018-08-27 ENCOUNTER — Encounter (HOSPITAL_COMMUNITY): Payer: Medicare Other

## 2018-09-01 ENCOUNTER — Telehealth (HOSPITAL_COMMUNITY): Payer: Self-pay | Admitting: Emergency Medicine

## 2018-09-01 NOTE — Telephone Encounter (Signed)
Reaching out to patient to offer assistance regarding upcoming cardiac imaging study; pt verbalizes understanding of appt date/time, parking situation and where to check in, pre-test NPO status and medications ordered, and verified current allergies; name and call back number provided for further questions should they arise Marchia Bond RN [place] [facility] [phone] office [phone] cell  Pt denies covid symptoms, verbalized understanding of visitor policy.  Pt verbalized understanding to check in at admitting at 12 noon for bicarb infusion

## 2018-09-02 ENCOUNTER — Ambulatory Visit (HOSPITAL_COMMUNITY)
Admission: RE | Admit: 2018-09-02 | Discharge: 2018-09-02 | Disposition: A | Discharge status: Home / Self Care | Payer: Medicare Other | Source: Ambulatory Visit | Attending: Physician Assistant | Admitting: Physician Assistant

## 2018-09-02 ENCOUNTER — Other Ambulatory Visit: Payer: Self-pay

## 2018-09-02 ENCOUNTER — Ambulatory Visit (HOSPITAL_COMMUNITY)
Admission: RE | Admit: 2018-09-02 | Discharge: 2018-09-02 | Disposition: A | Discharge status: Home / Self Care | Payer: Medicare Other | Source: Ambulatory Visit | Attending: Cardiology | Admitting: Cardiology

## 2018-09-02 DIAGNOSIS — I209 Angina pectoris, unspecified: Secondary | ICD-10-CM

## 2018-09-02 DIAGNOSIS — R079 Chest pain, unspecified: Secondary | ICD-10-CM

## 2018-09-02 DIAGNOSIS — I2510 Atherosclerotic heart disease of native coronary artery without angina pectoris: Secondary | ICD-10-CM | POA: Insufficient documentation

## 2018-09-02 LAB — BASIC METABOLIC PANEL
Anion gap: 9 (ref 5–15)
BUN: 28 mg/dL — ABNORMAL HIGH (ref 8–23)
CO2: 25 mmol/L (ref 22–32)
Calcium: 8.9 mg/dL (ref 8.9–10.3)
Chloride: 98 mmol/L (ref 98–111)
Creatinine, Ser: 1.27 mg/dL — ABNORMAL HIGH (ref 0.44–1.00)
GFR calc Af Amer: 49 mL/min — ABNORMAL LOW (ref >60)
GFR calc non Af Amer: 42 mL/min — ABNORMAL LOW (ref >60)
Glucose, Bld: 436 mg/dL — ABNORMAL HIGH (ref 70–99)
Potassium: 4.7 mmol/L (ref 3.5–5.1)
Sodium: 132 mmol/L — ABNORMAL LOW (ref 135–145)

## 2018-09-02 MED ORDER — SODIUM BICARBONATE BOLUS VIA INFUSION
INTRAVENOUS | Status: AC
  Administered 2018-09-02: 75 meq via INTRAVENOUS
  Filled 2018-09-02: qty 1

## 2018-09-02 MED ORDER — NITROGLYCERIN 0.4 MG SL SUBL
SUBLINGUAL_TABLET | SUBLINGUAL | Status: AC
  Filled 2018-09-02: qty 2

## 2018-09-02 MED ORDER — SODIUM BICARBONATE 8.4 % IV SOLN
INTRAVENOUS | Status: DC
  Filled 2018-09-02: qty 500

## 2018-09-02 MED ORDER — NITROGLYCERIN 0.4 MG SL SUBL
0.8000 mg | SUBLINGUAL_TABLET | SUBLINGUAL | Status: DC | PRN
  Administered 2018-09-02: 0.8 mg via SUBLINGUAL

## 2018-09-02 MED ORDER — IOHEXOL 350 MG/ML SOLN
100.0000 mL | Freq: Once | INTRAVENOUS | Status: AC | PRN
  Administered 2018-09-02: 80 mL via INTRAVENOUS

## 2018-09-02 MED ORDER — METOPROLOL TARTRATE 5 MG/5ML IV SOLN: 5.0000 mg | INTRAVENOUS | Status: DC | PRN

## 2018-09-02 NOTE — Progress Notes (Signed)
Spoke with radiology and informed them that pts bolus bicarb infusion will be completed at 1237

## 2018-09-03 ENCOUNTER — Telehealth: Payer: Self-pay

## 2018-09-03 DIAGNOSIS — I2510 Atherosclerotic heart disease of native coronary artery without angina pectoris: Secondary | ICD-10-CM

## 2018-09-03 DIAGNOSIS — R7989 Other specified abnormal findings of blood chemistry: Secondary | ICD-10-CM

## 2018-09-03 NOTE — Telephone Encounter (Signed)
Spoke with the pt. The pt will have her repeat bmet early to mid week next week. Pt will report to [facility] medical mal for lab. Order in [place].  Adv the pt that her cardiac CT is awaiting review and signature by the ordering provider and we will call her once available.  Pt verbalized understanding,

## 2018-09-03 NOTE — Telephone Encounter (Signed)
-----   Message from Nuala Alpha, LPN sent at 07/02/7822  5:52 PM EDT ----- This was sent to [facility] in-basket. Called her to arrange BMET and she wanted to speak with you first to arrange labs in [place] and inquire about needing lipids drawn.  ----- Message ----- From: Sueanne Margarita, MD Sent: 09/02/2018   4:50 PM EDT To: Nuala Alpha, LPN  Stable labs except BS very high - continue current meds and forward to PCP.  Repeat BMET in 1 week post CT.  Please call patient to see if she took her diabetes meds today

## 2018-09-05 ENCOUNTER — Other Ambulatory Visit: Payer: Self-pay | Admitting: Physician Assistant

## 2018-09-05 ENCOUNTER — Telehealth: Payer: Self-pay

## 2018-09-05 DIAGNOSIS — R0609 Other forms of dyspnea: Secondary | ICD-10-CM | POA: Insufficient documentation

## 2018-09-05 DIAGNOSIS — R931 Abnormal findings on diagnostic imaging of heart and coronary circulation: Secondary | ICD-10-CM

## 2018-09-05 DIAGNOSIS — I200 Unstable angina: Secondary | ICD-10-CM

## 2018-09-05 MED ORDER — ASPIRIN EC 81 MG PO TBEC: 81.0000 mg | DELAYED_RELEASE_TABLET | Freq: Every day | ORAL | Status: DC

## 2018-09-05 NOTE — Telephone Encounter (Signed)
-----   Message from Ryan M Dunn, PA-C sent at 09/03/2018  4:00 PM EDT ----- Please see detailed note on CTA of coronary arteries as well. FFR analysis showed significant lesions involving the mid/distal LAD and OM1. Needs LHC. I can call her around lunch time on 09/04/2018 to review the study, go over risks/benefits of cath, and go over any questions. Lipitor was was recently increased earlier this month to achieve goal LDL < 70. She will ned to keep previously planned follow up fasting lipid panel in 10/2018 to ensure LDL is < 70. Optimal control of her diabetes is recommended. She should follow up with her PCP regarding this. 

## 2018-09-05 NOTE — Telephone Encounter (Signed)
-----   Message from Rise Mu, PA-C sent at 09/03/2018  4:00 PM EDT ----- Please see detailed note on CTA of coronary arteries as well. FFR analysis showed significant lesions involving the mid/distal LAD and OM1. Needs LHC. I can call her around lunch time on 09/04/2018 to review the study, go over risks/benefits of cath, and go over any questions. Lipitor was was recently increased earlier this month to achieve goal LDL < 70. She will ned to keep previously planned follow up fasting lipid panel in 10/2018 to ensure LDL is < 70. Optimal control of her diabetes is recommended. She should follow up with her PCP regarding this.

## 2018-09-05 NOTE — Progress Notes (Signed)
Cath orders have been placed. 

## 2018-09-05 NOTE — Telephone Encounter (Signed)
Pt made aware of her cardiac cath date, time, location, and pre-procedure instructions.  Pt cath is scheduled for 09/15/18 @ 9:30am @ [place] with Dr.Arida -pt will arrive 1 hour prior  -pt adv that she will need to be NPO after midnight -pt adv that she should start Asa 81mg  daily -pt adv that on ths day pf the procedure she is to take her morning medications including Asa with just a sip of water. Pt adv to not take her diabetic medications or lasix the morning of -pt will have pre-procedure lab and COVID testing on 09/11/18. Pt adv that she will needs to [facility] medical arts building between 12-2:30pm and that it is drive up only. Pt encouraged to self quarantine after testing. -pt adv that she should plan for an overnight stay incase pci is indicated -pt adv that she is allowed 1 support person to attend. Adv the pt that she will not be allowed to drive her self home  Pt verbalized understanding to the instructions given and has no question or concerns at this time. Pt adv to contact the office if any.

## 2018-09-11 ENCOUNTER — Other Ambulatory Visit: Payer: Self-pay

## 2018-09-11 ENCOUNTER — Other Ambulatory Visit
Admission: RE | Admit: 2018-09-11 | Discharge: 2018-09-11 | Disposition: A | Discharge status: Home / Self Care | Payer: Medicare Other | Source: Ambulatory Visit | Attending: Cardiovascular Disease | Admitting: Cardiovascular Disease

## 2018-09-11 DIAGNOSIS — Z20828 Contact with and (suspected) exposure to other viral communicable diseases: Secondary | ICD-10-CM | POA: Insufficient documentation

## 2018-09-11 DIAGNOSIS — Z01812 Encounter for preprocedural laboratory examination: Secondary | ICD-10-CM | POA: Insufficient documentation

## 2018-09-11 DIAGNOSIS — R931 Abnormal findings on diagnostic imaging of heart and coronary circulation: Secondary | ICD-10-CM

## 2018-09-11 LAB — BASIC METABOLIC PANEL
Anion gap: 14 (ref 5–15)
BUN: 30 mg/dL — ABNORMAL HIGH (ref 8–23)
CO2: 25 mmol/L (ref 22–32)
Calcium: 9.4 mg/dL (ref 8.9–10.3)
Chloride: 100 mmol/L (ref 98–111)
Creatinine, Ser: 1.26 mg/dL — ABNORMAL HIGH (ref 0.44–1.00)
GFR calc Af Amer: 50 mL/min — ABNORMAL LOW (ref >60)
GFR calc non Af Amer: 43 mL/min — ABNORMAL LOW (ref >60)
Glucose, Bld: 62 mg/dL — ABNORMAL LOW (ref 70–99)
Potassium: 3.9 mmol/L (ref 3.5–5.1)
Sodium: 139 mmol/L (ref 135–145)

## 2018-09-11 LAB — CBC WITH DIFFERENTIAL/PLATELET
Abs Immature Granulocytes: 0.02 10*3/uL (ref 0.00–0.07)
Basophils Absolute: 0.1 10*3/uL (ref 0.0–0.1)
Basophils Relative: 1 %
Eosinophils Absolute: 0.2 10*3/uL (ref 0.0–0.5)
Eosinophils Relative: 3 %
HCT: 44.4 % (ref 36.0–46.0)
Hemoglobin: 14.2 g/dL (ref 12.0–15.0)
Immature Granulocytes: 0 %
Lymphocytes Relative: 18 %
Lymphs Abs: 1.5 10*3/uL (ref 0.7–4.0)
MCH: 30.6 pg (ref 26.0–34.0)
MCHC: 32 g/dL (ref 30.0–36.0)
MCV: 95.7 fL (ref 80.0–100.0)
Monocytes Absolute: 0.7 10*3/uL (ref 0.1–1.0)
Monocytes Relative: 9 %
Neutro Abs: 5.5 10*3/uL (ref 1.7–7.7)
Neutrophils Relative %: 69 %
Platelets: 272 10*3/uL (ref 150–400)
RBC: 4.64 MIL/uL (ref 3.87–5.11)
RDW: 13.2 % (ref 11.5–15.5)
WBC: 8 10*3/uL (ref 4.0–10.5)
nRBC: 0 % (ref 0.0–0.2)

## 2018-09-12 LAB — SARS CORONAVIRUS 2 (TAT 6-24 HRS): SARS Coronavirus 2: NEGATIVE

## 2018-09-15 ENCOUNTER — Ambulatory Visit
Admission: RE | Admit: 2018-09-15 | Discharge: 2018-09-15 | Disposition: A | Discharge status: Home / Self Care | Payer: Medicare Other | Attending: Cardiovascular Disease | Admitting: Cardiovascular Disease

## 2018-09-15 ENCOUNTER — Encounter
Admission: RE | Disposition: A | Discharge status: Home / Self Care | Payer: Self-pay | Source: Home / Self Care | Attending: Cardiovascular Disease

## 2018-09-15 ENCOUNTER — Encounter: Payer: Self-pay | Admitting: *Deleted

## 2018-09-15 ENCOUNTER — Other Ambulatory Visit: Payer: Self-pay

## 2018-09-15 DIAGNOSIS — Z880 Allergy status to penicillin: Secondary | ICD-10-CM | POA: Insufficient documentation

## 2018-09-15 DIAGNOSIS — Z794 Long term (current) use of insulin: Secondary | ICD-10-CM | POA: Insufficient documentation

## 2018-09-15 DIAGNOSIS — Z6841 Body Mass Index (BMI) 40.0 and over, adult: Secondary | ICD-10-CM | POA: Insufficient documentation

## 2018-09-15 DIAGNOSIS — I5032 Chronic diastolic (congestive) heart failure: Secondary | ICD-10-CM | POA: Insufficient documentation

## 2018-09-15 DIAGNOSIS — Z881 Allergy status to other antibiotic agents status: Secondary | ICD-10-CM | POA: Diagnosis not present

## 2018-09-15 DIAGNOSIS — R931 Abnormal findings on diagnostic imaging of heart and coronary circulation: Secondary | ICD-10-CM

## 2018-09-15 DIAGNOSIS — E785 Hyperlipidemia, unspecified: Secondary | ICD-10-CM | POA: Insufficient documentation

## 2018-09-15 DIAGNOSIS — I272 Pulmonary hypertension, unspecified: Secondary | ICD-10-CM | POA: Diagnosis not present

## 2018-09-15 DIAGNOSIS — F410 Panic disorder [episodic paroxysmal anxiety] without agoraphobia: Secondary | ICD-10-CM | POA: Diagnosis not present

## 2018-09-15 DIAGNOSIS — R079 Chest pain, unspecified: Secondary | ICD-10-CM | POA: Insufficient documentation

## 2018-09-15 DIAGNOSIS — Z883 Allergy status to other anti-infective agents status: Secondary | ICD-10-CM | POA: Diagnosis not present

## 2018-09-15 DIAGNOSIS — M1990 Unspecified osteoarthritis, unspecified site: Secondary | ICD-10-CM | POA: Insufficient documentation

## 2018-09-15 DIAGNOSIS — G4733 Obstructive sleep apnea (adult) (pediatric): Secondary | ICD-10-CM | POA: Diagnosis not present

## 2018-09-15 DIAGNOSIS — R0609 Other forms of dyspnea: Secondary | ICD-10-CM | POA: Insufficient documentation

## 2018-09-15 DIAGNOSIS — Z79899 Other long term (current) drug therapy: Secondary | ICD-10-CM | POA: Diagnosis not present

## 2018-09-15 DIAGNOSIS — Z791 Long term (current) use of non-steroidal anti-inflammatories (NSAID): Secondary | ICD-10-CM | POA: Insufficient documentation

## 2018-09-15 DIAGNOSIS — Z9119 Patient's noncompliance with other medical treatment and regimen: Secondary | ICD-10-CM | POA: Insufficient documentation

## 2018-09-15 DIAGNOSIS — I110 Hypertensive heart disease with heart failure: Secondary | ICD-10-CM | POA: Diagnosis not present

## 2018-09-15 DIAGNOSIS — R0602 Shortness of breath: Secondary | ICD-10-CM | POA: Diagnosis present

## 2018-09-15 DIAGNOSIS — I200 Unstable angina: Secondary | ICD-10-CM | POA: Insufficient documentation

## 2018-09-15 DIAGNOSIS — I2510 Atherosclerotic heart disease of native coronary artery without angina pectoris: Secondary | ICD-10-CM | POA: Diagnosis not present

## 2018-09-15 DIAGNOSIS — F329 Major depressive disorder, single episode, unspecified: Secondary | ICD-10-CM | POA: Insufficient documentation

## 2018-09-15 DIAGNOSIS — E11319 Type 2 diabetes mellitus with unspecified diabetic retinopathy without macular edema: Secondary | ICD-10-CM | POA: Diagnosis not present

## 2018-09-15 HISTORY — DX: Dyspnea, unspecified: R06.00

## 2018-09-15 HISTORY — DX: Angina pectoris, unspecified: I20.9

## 2018-09-15 HISTORY — PX: LEFT HEART CATH AND CORONARY ANGIOGRAPHY: CATH118249

## 2018-09-15 LAB — GLUCOSE, CAPILLARY
Glucose-Capillary: 180 mg/dL — ABNORMAL HIGH (ref 70–99)
Glucose-Capillary: 181 mg/dL — ABNORMAL HIGH (ref 70–99)

## 2018-09-15 SURGERY — RIGHT/LEFT HEART CATH AND CORONARY ANGIOGRAPHY
Anesthesia: Moderate Sedation

## 2018-09-15 SURGERY — LEFT HEART CATH AND CORONARY ANGIOGRAPHY
Anesthesia: Moderate Sedation | Laterality: Left

## 2018-09-15 MED ORDER — SODIUM CHLORIDE 0.9% FLUSH: 3.0000 mL | INTRAVENOUS | Status: DC | PRN

## 2018-09-15 MED ORDER — ASPIRIN 81 MG PO CHEW
CHEWABLE_TABLET | ORAL | Status: AC
  Administered 2018-09-15: 81 mg
  Filled 2018-09-15: qty 1

## 2018-09-15 MED ORDER — SODIUM CHLORIDE 0.9% FLUSH: 3.0000 mL | Freq: Two times a day (BID) | INTRAVENOUS | Status: DC

## 2018-09-15 MED ORDER — VERAPAMIL HCL 2.5 MG/ML IV SOLN
INTRAVENOUS | Status: DC | PRN
  Administered 2018-09-15: 2.5 mg via INTRA_ARTERIAL

## 2018-09-15 MED ORDER — MIDAZOLAM HCL 2 MG/2ML IJ SOLN
INTRAMUSCULAR | Status: DC | PRN
  Administered 2018-09-15: 1 mg via INTRAVENOUS

## 2018-09-15 MED ORDER — HEPARIN SODIUM (PORCINE) 1000 UNIT/ML IJ SOLN
INTRAMUSCULAR | Status: DC | PRN
  Administered 2018-09-15: 6000 [IU] via INTRAVENOUS

## 2018-09-15 MED ORDER — HEPARIN SODIUM (PORCINE) 1000 UNIT/ML IJ SOLN
INTRAMUSCULAR | Status: AC
  Filled 2018-09-15: qty 1

## 2018-09-15 MED ORDER — HEPARIN (PORCINE) IN NACL 1000-0.9 UT/500ML-% IV SOLN
INTRAVENOUS | Status: DC | PRN
  Administered 2018-09-15: 500 mL

## 2018-09-15 MED ORDER — IOHEXOL 300 MG/ML  SOLN
INTRAMUSCULAR | Status: DC | PRN
  Administered 2018-09-15: 10:00:00 70 mL via INTRA_ARTERIAL

## 2018-09-15 MED ORDER — SODIUM CHLORIDE 0.9 % IV SOLN: 250.0000 mL | INTRAVENOUS | Status: DC | PRN

## 2018-09-15 MED ORDER — SODIUM CHLORIDE 0.9 % IV SOLN
INTRAVENOUS | Status: DC
  Administered 2018-09-15: 09:00:00 via INTRAVENOUS

## 2018-09-15 MED ORDER — FENTANYL CITRATE (PF) 100 MCG/2ML IJ SOLN
INTRAMUSCULAR | Status: AC
  Filled 2018-09-15: qty 2

## 2018-09-15 MED ORDER — MIDAZOLAM HCL 2 MG/2ML IJ SOLN
INTRAMUSCULAR | Status: AC
  Filled 2018-09-15: qty 2

## 2018-09-15 MED ORDER — VERAPAMIL HCL 2.5 MG/ML IV SOLN
INTRAVENOUS | Status: AC
  Filled 2018-09-15: qty 2

## 2018-09-15 MED ORDER — FENTANYL CITRATE (PF) 100 MCG/2ML IJ SOLN
INTRAMUSCULAR | Status: DC | PRN
  Administered 2018-09-15: 25 ug via INTRAVENOUS

## 2018-09-15 SURGICAL SUPPLY — 8 items
CATH INFINITI 5 FR JL3.5 (CATHETERS) ×3 IMPLANT
CATH INFINITI 5FR JK (CATHETERS) ×3 IMPLANT
CATH INFINITI JR4 5F (CATHETERS) ×3 IMPLANT
DEVICE RAD TR BAND REGULAR (VASCULAR PRODUCTS) ×3 IMPLANT
GLIDESHEATH SLEND SS 6F .021 (SHEATH) ×3 IMPLANT
KIT MANI 3VAL PERCEP (MISCELLANEOUS) ×3 IMPLANT
PACK CARDIAC CATH (CUSTOM PROCEDURE TRAY) ×3 IMPLANT
WIRE ROSEN-J .035X260CM (WIRE) ×3 IMPLANT

## 2018-09-15 NOTE — H&P (Signed)
Date:  08/13/2018  Patient ID:  Darriona, Dehaas 1945/11/12, MRN [id number] PCP:  Marinda Elk, MD        Cardiologist:  Dr. Fletcher Anon, MD    Chief Complaint: Chest pain/SOB  History of Present Illness: AVABELLA WAILES is a 73 y.o. female with history of nonobstructive CAD by LHC in 2017, mild pulmonary hypertension, uncontrolled DM2, HTN, HLD, medical noncompliance, obesity, and OSA noncompliant with CPAP who presents for evaluation of chest pain and SOB.   She was evaluated in 2017 for exertional dyspnea with [facility] showed no evidence of obstructive CAD with mild luminal irregularities involving the LAD which was mildly calcified, small LCx with territory supplied by posterolateral branches of the RCA, and angiographically normal RCA with normal LSVF and mildly elevated LVEDP of 18 mmHg. RHC showed mild pulmonary hypertension with a pulmonary pressure of 37/17 with a mean of 25 mmHg. Her cath findings did not explain her symptoms. She was most recently seen in telehealth follow up on 06/27/2018 and was stable from a cardiac perspective. Her biggest issues at that time were uncontrolled diabetes and back pain.   Patient was seen by PCP on 08/07/2018 noting chest discomfort that radiated to her neck with associated SOB with her family suspecting panic attack on 7/1. She was given Xanax. PCP note indicates the patient has not been eating well and was feeling overall fatigued and generally poor. She was noted to be noncompliant with her medications as well as her CPAP. Documented EKG from that visit showed sinus bradycardia, 54 bpm, no acute ST-T changes. Hs-Tn was normal at 11, delta troponin not performed. D-dimer was elevated at 1451.71 with CTA chest showing no evidence for PE with scattered atherosclerotic calcifications involving the thoracic aorta without aneurysm or dissection, 3-vessel coronary artery calcification, patchy ground-glass opacity felt to represent small airway disease, no focal  infiltrates or effusions. WBC count 8.4, HGB 12.0, PLT 221, sodium 132, glucose 430 with corrected sodium of 140, potassium 4.6, SCr 1.2, AST/ALT normal, albumin 3.3.  Patient comes in today noting she has not had any further chest pain or shortness of breath since her episode on 7/1.  However, she does note some increased fatigue which she has attributed to her morbid obesity, physical deconditioning, and chronic back pain.  She feels strongly that her episode on 7/1 was related to anxiety/panic attack given symptoms improved with Xanax.  She denies any associated palpitations, dizziness, presyncope, syncope, lower extremity swelling, abdominal distention, orthopnea, PND, early satiety.  No falls, BRBPR, melena.  Patient states shortness of breath was similar to her presentation in 2017 with cardiac catheterization revealing nonobstructive disease and mild pulmonary hypertension.  With weight loss her symptoms significantly improved, however over the past several months her weight has again trended back up and she wonders if this too is playing a role.  Currently symptom-free.       Past Medical History:  Diagnosis Date  . Anemia    before hysterectomy  . Depression   . Diabetes mellitus, type 2 ([place])   . DM retinopathy ([place])   . Edema   . Hx of phlebitis   . Hyperlipidemia   . Hypertension   . Mild allergic rhinitis   . MRSA (methicillin resistant staph aureus) culture positive 03/2006  . OA (osteoarthritis)    hands  . Sleep apnea          Past Surgical History:  Procedure Laterality Date  . ABDOMINAL HYSTERECTOMY    .  ADENOIDECTOMY    . APPENDECTOMY  1989  . barium enema  1998   neg CT  . CARDIAC CATHETERIZATION Bilateral 12/08/2015   Procedure: Right/Left Heart Cath and Coronary Angiography;  Surgeon: Iran OuchMuhammad A Dahl Higinbotham, MD;  Location: [facility] INVASIVE CV LAB;  Service: Cardiovascular;  Laterality: Bilateral;  . CHOLECYSTECTOMY  1989  . LUMBAR  LAMINECTOMY/DECOMPRESSION MICRODISCECTOMY N/A 11/25/2014   Procedure: LUMBAR LAMINECTOMY/DECOMPRESSION MICRODISCECTOMY LUMBAR TWO-THREE, LUMBAR THREE-FOUR ;  Surgeon: Tressie StalkerJeffrey Jenkins, MD;  Location: [facility] NEURO ORS;  Service: Neurosurgery;  Laterality: N/A;  . ROTATOR CUFF REPAIR  08/2001   right    Active Medications      Current Meds  Medication Sig  . Cholecalciferol (VITAMIN D3) 125 MCG (5000 UT) TABS Take by mouth daily.   . clotrimazole-betamethasone (LOTRISONE) cream Apply 1 application topically 2 (two) times daily.  . Cyanocobalamin (VITAMIN B12) 3000 MCG SUBL Take 3,000 mg by mouth daily.  Marland Kitchen. diltiazem (CARDIZEM CD) 120 MG 24 hr capsule Take 120 mg by mouth daily.   . DULoxetine (CYMBALTA) 60 MG capsule Take 60 mg by mouth daily.  . furosemide (LASIX) 40 MG tablet TAKE 1 TABLET BY MOUTH ONCE A DAY  . insulin detemir (LEVEMIR) 100 UNIT/ML injection Inject 50-64 Units into the skin 2 (two) times daily. 40 units in the morning and 65 units in the evening  . insulin lispro (HUMALOG) 100 UNIT/ML injection Inject 20-26 Units into the skin 4 (four) times daily - after meals and at bedtime. 24 units before breakfast and lunch and 28 units before supper - plus sliding scale (4 units per 50 over a target of 150)  . irbesartan (AVAPRO) 300 MG tablet Take 300 mg by mouth daily.   . meloxicam (MOBIC) 7.5 MG tablet TAKE ONE CAPSULE BY MOUTH DAILY  . pioglitazone (ACTOS) 45 MG tablet Take 45 mg by mouth daily.  . rosuvastatin (CRESTOR) 20 MG tablet Take 10 mg by mouth daily.       Allergies:   Tegaderm ag mesh [silver], Amoxicillin, Metformin, Azithromycin, Clindamycin, Fluconazole, Oxycodone-acetaminophen, and Simvastatin   Social History:  The patient  reports that she has never smoked. She has never used smokeless tobacco. She reports that she does not drink alcohol or use drugs.   Family History:  The patient's family history includes Alcohol abuse in her father and mother; Aneurysm in  her father and paternal grandmother; Breast cancer in her paternal aunt; Cancer in her father; Cancer (age of onset: 2185) in her mother; Diabetes in her maternal grandmother and mother.  ROS:   Review of Systems  Constitutional: Positive for malaise/fatigue. Negative for chills, diaphoresis, fever and weight loss.  HENT: Negative for congestion.   Eyes: Negative for discharge and redness.  Respiratory: Positive for shortness of breath. Negative for cough, hemoptysis, sputum production and wheezing.   Cardiovascular: Positive for chest pain. Negative for palpitations, orthopnea, claudication, leg swelling and PND.  Gastrointestinal: Negative for abdominal pain, blood in stool, heartburn, melena, nausea and vomiting.  Genitourinary: Negative for hematuria.  Musculoskeletal: Negative for falls and myalgias.  Skin: Negative for rash.  Neurological: Positive for weakness. Negative for dizziness, tingling, tremors, sensory change, speech change, focal weakness and loss of consciousness.  Endo/Heme/Allergies: Does not bruise/bleed easily.  Psychiatric/Behavioral: Negative for substance abuse. The patient is nervous/anxious.   All other systems reviewed and are negative.    PHYSICAL EXAM:  VS:  BP 128/60 (BP Location: Right Arm, Patient Position: Sitting, Cuff Size: Large)   Pulse (!) 59  Temp (!) 96.7 F (35.9 C)   Ht 5\' 4"  (1.626 m)   Wt 247 lb 8 oz (112.3 kg)   SpO2 97%   BMI 42.48 kg/m  BMI: Body mass index is 42.48 kg/m.  Physical Exam  Constitutional: She is oriented to person, place, and time. She appears well-developed and well-nourished.  HENT:  Head: Normocephalic and atraumatic.  Eyes: Right eye exhibits no discharge. Left eye exhibits no discharge.  Neck: Normal range of motion. No JVD present.  Cardiovascular: Normal rate, regular rhythm, S1 normal, S2 normal and normal heart sounds. Exam reveals no distant heart sounds, no friction rub, no midsystolic click and no  opening snap.  No murmur heard. Pulses:      Posterior tibial pulses are 2+ on the right side and 2+ on the left side.  Pulmonary/Chest: Effort normal and breath sounds normal. No respiratory distress. She has no decreased breath sounds. She has no wheezes. She has no rales. She exhibits no tenderness.  Abdominal: Soft. She exhibits no distension. There is no abdominal tenderness.  Musculoskeletal:        General: No edema.  Neurological: She is alert and oriented to person, place, and time.  Skin: Skin is warm and dry. No cyanosis. Nails show no clubbing.  Psychiatric: She has a normal mood and affect. Her speech is normal and behavior is normal. Judgment and thought content normal.     EKG:  Was ordered and interpreted by me today. Shows sinus bradycardia, 59 bpm, TWI aVL  Recent Labs: No results found for requested labs within last 8760 hours.  No results found for requested labs within last 8760 hours.   CrCl cannot be calculated (Patient's most recent lab result is older than the maximum 21 days allowed.).      Wt Readings from Last 3 Encounters:  08/13/18 247 lb 8 oz (112.3 kg)  06/27/18 242 lb (109.8 kg)  03/12/16 256 lb 4 oz (116.2 kg)     Other studies reviewed: Additional studies/records reviewed today include: summarized above  ASSESSMENT AND PLAN:  1. 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  Prior cardiac cath in 12/2015 demonstrated minor luminal irregularities involving the LAD which was mildly calcified.  More recently, she was seen by PCP on 7/2 for episode of chest pain occurring on 7/1 with negative high-sensitivity troponin and elevated d-dimer with subsequent CTA chest being negative for evidence of PE with noted aortic atherosclerosis and coronary artery calcifications, specifically involving the LAD however there were also scattered calcifications involving the LCx and RCA.  This corresponds with  the noted calcification of her LAD in 2017.  However, we cannot exclude progression of potential stenosis in this area as the CT was not gated to evaluate the coronary arteries.  We have had an extensive discussion regarding invasive and noninvasive ischemic evaluation and have agreed to proceed with coronary artery CTA.  Given noted coronary artery calcification aggressive risk factor modification should be pursued.  She has suboptimally controlled diabetes and often forgets to take her medication.  I discussed with her different phone apps which would remind her to take her medications.  We should also set a goal LDL for her of less than 70 with most recent LDL from 03/2018 being 80.  In this setting, I have titrated her Crestor to 20 mg daily.  She will need a follow-up lipid and liver function in 2 months.  2. Chronic diastolic CHF/PAH: She appears relatively  euvolemic and well compensated.  Her pulmonary hypertension is likely in setting of untreated sleep apnea.  She remains asymptomatic with current dose of Lasix.  If CTA of the coronary arteries is unrevealing could consider echo to evaluate LV diastolic function and pulmonary arterial pressure.  I had an extensive conversation with the patient and her sister regarding the ramifications of untreated pulmonary hypertension.  3. Obstructive sleep apnea: She indicates prior sleep study in 2017 demonstrating sleep apnea however she is noncompliant with CPAP.  She is interested in revisiting this and will be referred to pulmonology for further discussion.  Compliance with CPAP is strongly recommended.  4. Hyperlipidemia: Most recent LDL of 80 from 03/2018 with a triglyceride of 136.  Increase Crestor to 20 mg daily.  She will need a follow-up lipid and liver function in approximately 2 months.  5. Hypertension: Blood pressure is well controlled in the office today.  No medication changes.  6. Diabetes: Poorly controlled.  Given mild heart failure  would recommend decreasing the dose of Actos or changing to alternative medication.  This will be deferred to the patient's primary care physician.  7. Morbid obesity: Weight loss is strongly advised.  With regards to her shortness of breath I suspect this is likely multifactorial including morbid obesity, physical deconditioning, diastolic CHF, and PAH.  We are evaluating for CAD as outlined above.  Disposition: F/u with Dr. Kirke CorinArida or an APP in 2 months.  Current medicines are reviewed at length with the patient today.  The patient did not have any concerns regarding medicines.  SignedEula Listen, Ryan Dunn, PA-C 08/13/2018 3:11 PM      Addendum on September 15, 2018.  The patient had CTA of the coronary arteries which showed significant mid to distal LAD disease and first diagonal disease.  The study was personally reviewed by me.  Given the patient's symptoms of exertional dyspnea and CTA findings, left heart catheterization was recommended.  I discussed the procedure in details with the patient and she had no further questions.  By physical exam, heart is regular with no murmurs.  Lungs are clear to auscultation with no significant leg edema.

## 2018-09-15 NOTE — Discharge Instructions (Signed)
Coronary Artery Disease, Female °Coronary artery disease (CAD) is a condition in which the arteries that lead to the heart (coronary arteries) become narrow or blocked. The narrowing or blockage can lead to decreased blood flow to the heart. Prolonged reduced blood flow can cause a heart attack (myocardial infarction or MI). This condition may also be called coronary heart disease. °Because CAD is the leading cause of death in women, it is important to understand what causes this condition and how it is treated. °What are the causes? °CAD is most often caused by atherosclerosis. This is the buildup of fat and cholesterol (plaque) on the inside of the arteries. Over time, the plaque may narrow or block the artery, reducing blood flow to the heart. Plaque can also become weak and break off within a coronary artery and cause a sudden blockage. Other less common causes of CAD include: °· A blood clot or a piece of a blood clot or other substance that blocks the flow of blood in a coronary artery (embolism). °· A tearing of the artery (spontaneous coronary artery dissection). °· An enlargement of an artery (aneurysm). °· Inflammation (vasculitis) in the artery wall. °What increases the risk? °The following factors may make you more likely to develop this condition: °· Age. Women over age 55 are at a greater risk of CAD. °· Family history of CAD. °· High blood pressure (hypertension). °· Diabetes. °· High cholesterol levels. °· Tobacco use. °· Lack of exercise. °· Menopause. °? All postmenopausal women are at greater risk of CAD. °? Women who have experienced menopause between the ages of 40-45 (early menopause) are at a higher risk of CAD. °? Women who have experienced menopause before age 40 (premature menopause) are at a very high risk of CAD. °· Excessive alcohol use. °· A diet high in saturated and trans fats, such as fried food and processed meat. °Other possible risk factors include: °· High stress  levels. °· Depression. °· Obesity. °· Sleep apnea. °What are the signs or symptoms? °Many people do not have any symptoms during the early stages of CAD. As the condition progresses, symptoms may include: °· Chest pain (angina). The pain can: °? Feel like crushing or squeezing, or like a tightness, pressure, fullness, or heaviness in the chest. °? Last more than a few minutes or can stop and recur. The pain tends to get worse with exercise or stress and to fade with rest. °· Pain in the arms, neck, jaw, ear, or back. °· Unexplained heartburn or indigestion. °· Shortness of breath. °· Nausea. °· Sudden cold sweats. °· Sudden light-headedness. °· Fluttering or fast heartbeat (palpitations). °Many women have chest discomfort and the other symptoms. However, women often have unusual (atypical) symptoms, such as: °· Fatigue. °· Vomiting. °· Unexplained feelings of nervousness or anxiety. °· Unexplained weakness. °· Dizziness or fainting. °How is this diagnosed? °This condition is diagnosed based on: °· Your family and medical history. °· A physical exam. °· Tests, including: °? A test to check the electrical signals in your heart (electrocardiogram). °? Exercise stress test. This looks for signs of blockage when the heart is stressed with exercise, such as running on a treadmill. °? Pharmacologic stress test. This test looks for signs of blockage when the heart is being stressed with a medicine. °? Blood tests. °? Coronary angiogram. This is a procedure to look at the coronary arteries to see if there is any blockage. During this test, a dye is injected into your arteries so they   appear on an X-ray. ? Coronary artery CT scan. This CT scan helps detect calcium deposits in your coronary arteries. Calcium deposits are an indicator of CAD. ? A test that uses sound waves to take a picture of your heart (echocardiogram). ? Chest X-ray. How is this treated? This condition may be treated by:  Healthy lifestyle changes to  reduce risk factors.  Medicines such as: ? Antiplatelet medicines and blood-thinning medicines, such as aspirin. These help to prevent blood clots. ? Nitroglycerin. ? Blood pressure medicines. ? Cholesterol-lowering medicine.  Coronary angioplasty and stenting. During this procedure, a thin, flexible tube is inserted through a blood vessel and into a blocked artery. A balloon or similar device on the end of the tube is inflated to open up the artery. In some cases, a small, mesh tube (stent) is inserted into the artery to keep it open.  Coronary artery bypass surgery. During this surgery, veins or arteries from other parts of the body are used to create a bypass around the blockage and allow blood to reach your heart. Follow these instructions at home: Medicines  Take over-the-counter and prescription medicines only as told by your health care provider.  Do not take the following medicines unless your health care provider approves: ? NSAIDs, such as ibuprofen, naproxen, or celecoxib. ? Vitamin supplements that contain vitamin A, vitamin E, or both. ? Hormone replacement therapy that contains estrogen with or without progestin. Lifestyle  Follow an exercise program approved by your health care provider. Aim for 150 minutes of moderate exercise or 75 minutes of vigorous exercise each week.  Maintain a healthy weight or lose weight as approved by your health care provider.  Learn to manage stress or try to limit your stress. Ask your health care provider for suggestions if you need help.  Get screened for depression and seek treatment, if needed.  Do not use any products that contain nicotine or tobacco, such as cigarettes, e-cigarettes, and chewing tobacco. If you need help quitting, ask your health care provider.  Do not use illegal drugs. Eating and drinking   Follow a heart-healthy diet. A dietitian can help educate you about healthy food options and changes. In general, eat  plenty of fruits and vegetables, lean meats, and whole grains.  Avoid foods high in: ? Sugar. ? Salt (sodium). ? Saturated fats, such as processed or fatty meat. ? Trans fats, such as fried food.  Use healthy cooking methods such as roasting, grilling, broiling, baking, poaching, steaming, or stir-frying.  Do not drink alcohol if: ? Your health care provider tells you not to drink. ? You are pregnant, may be pregnant, or are planning to become pregnant.  If you drink alcohol: ? Limit how much you have to 0-1 drink a day. ? Be aware of how much alcohol is in your drink. In the [place] one drink equals one 12 oz bottle of beer (355 mL), one 5 oz glass of wine (148 mL), or one 1 oz glass of hard liquor (44 mL). General instructions  Manage any other health conditions, such as hypertension and diabetes. These conditions affect your heart.  Your health care provider may ask you to monitor your blood pressure. Ideally, your blood pressure should be below 130/80.  Keep all follow-up visits as told by your health care provider. This is important. Get help right away if:  You have pain in your chest, neck, ear, arm, jaw, stomach, or back that: ? Lasts more than a few minutes. ?  Is recurring. ? Is not relieved by taking medicine under your tongue (sublingual nitroglycerin).  You have profuse sweating without cause.  You have unexplained: ? Heartburn or indigestion. ? Shortness of breath or difficulty breathing. ? Fluttering or fast heartbeat (palpitations). ? Nausea or vomiting. ? Fatigue. ? Feelings of nervousness or anxiety. ? Weakness. ? Diarrhea.  You have sudden light-headedness or dizziness.  You faint.  You feel like hurting yourself or think about taking your own life. These symptoms may represent a serious problem that is an emergency. Do not wait to see if the symptoms will go away. Get medical help right away. Call your local emergency services (911 in the [place] Do  not drive yourself to the hospital. Summary  Coronary artery disease (CAD) is a condition in which the arteries that lead to the heart (coronary arteries) become narrow or blocked. The narrowing or blockage can lead to a heart attack.  Many women have chest discomfort and other common symptoms of CAD. However, women often have unusual (atypical) symptoms, such as fatigue, vomiting, weakness, or dizziness.  CAD can be treated with lifestyle changes, medicines, surgery, or a combination of these treatments. This information is not intended to replace advice given to you by your health care provider. Make sure you discuss any questions you have with your health care provider. Document Released: 04/16/2011 Document Revised: 10/11/2017 Document Reviewed: 10/01/2017 Elsevier Patient Education  2020 [employer]  This sheet gives you information about how to care for yourself after your procedure. Your health care provider may also give you more specific instructions. If you have problems or questions, contact your health care provider. What can I expect after the procedure? After the procedure, it is common to have:  Bruising and tenderness at the catheter insertion area. Follow these instructions at home: Medicines  Take over-the-counter and prescription medicines only as told by your health care provider. Insertion site care  Follow instructions from your health care provider about how to take care of your insertion site. Make sure you: ? Wash your hands with soap and water before you change your bandage (dressing). If soap and water are not available, use hand sanitizer. ? Change your dressing as told by your health care provider. ? Leave stitches (sutures), skin glue, or adhesive strips in place. These skin closures may need to stay in place for 2 weeks or longer. If adhesive strip edges start to loosen and curl up, you may trim the loose edges. Do not remove  adhesive strips completely unless your health care provider tells you to do that.  Check your insertion site every day for signs of infection. Check for: ? Redness, swelling, or pain. ? Fluid or blood. ? Pus or a bad smell. ? Warmth.  Do not take baths, swim, or use a hot tub until your health care provider approves.  You may shower 24-48 hours after the procedure, or as directed by your health care provider. ? Remove the dressing and gently wash the site with plain soap and water. ? Pat the area dry with a clean towel. ? Do not rub the site. That could cause bleeding.  Do not apply powder or lotion to the site. Activity   For 24 hours after the procedure, or as directed by your health care provider: ? Do not flex or bend the affected arm. ? Do not push or pull heavy objects with the affected arm. ? Do not drive yourself home  from the hospital or clinic. You may drive 24 hours after the procedure unless your health care provider tells you not to. ? Do not operate machinery or power tools.  Do not lift anything that is heavier than 10 lb (4.5 kg), or the limit that you are told, until your health care provider says that it is safe.  Ask your health care provider when it is okay to: ? Return to work or school. ? Resume usual physical activities or sports. ? Resume sexual activity. General instructions  If the catheter site starts to bleed, raise your arm and put firm pressure on the site. If the bleeding does not stop, get help right away. This is a medical emergency.  If you went home on the same day as your procedure, a responsible adult should be with you for the first 24 hours after you arrive home.  Keep all follow-up visits as told by your health care provider. This is important. Contact a health care provider if:  You have a fever.  You have redness, swelling, or yellow drainage around your insertion site. Get help right away if:  You have unusual pain at the radial  site.  The catheter insertion area swells very fast.  The insertion area is bleeding, and the bleeding does not stop when you hold steady pressure on the area.  Your arm or hand becomes pale, cool, tingly, or numb. These symptoms may represent a serious problem that is an emergency. Do not wait to see if the symptoms will go away. Get medical help right away. Call your local emergency services (911 in the [place] Do not drive yourself to the hospital. Summary  After the procedure, it is common to have bruising and tenderness at the site.  Follow instructions from your health care provider about how to take care of your radial site wound. Check the wound every day for signs of infection.  Do not lift anything that is heavier than 10 lb (4.5 kg), or the limit that you are told, until your health care provider says that it is safe. This information is not intended to replace advice given to you by your health care provider. Make sure you discuss any questions you have with your health care provider. Document Released: 02/24/2010 Document Revised: 02/27/2017 Document Reviewed: 02/27/2017 Elsevier Patient Education  2020 [employer]    Angiogram, Care After This sheet gives you information about how to care for yourself after your procedure. Your doctor may also give you more specific instructions. If you have problems or questions, contact your doctor. Follow these instructions at home: Insertion site care  Follow instructions from your doctor about how to take care of your long, thin tube (catheter) insertion area. Make sure you: ? Wash your hands with soap and water before you change your bandage (dressing). If you cannot use soap and water, use hand sanitizer. ? Change your bandage as told by your doctor. ? Leave stitches (sutures), skin glue, or skin tape (adhesive) strips in place. They may need to stay in place for 2 weeks or longer. If tape strips get loose and curl up, you may trim  the loose edges. Do not remove tape strips completely unless your doctor says it is okay.  Do not take baths, swim, or use a hot tub until your doctor says it is okay.  You may shower 24-48 hours after the procedure or as told by your doctor. ? Gently wash the area with plain soap and  water. °? Pat the area dry with a clean towel. °? Do not rub the area. This may cause bleeding. °· Do not apply powder or lotion to the area. Keep the area clean and dry. °· Check your insertion area every day for signs of infection. Check for: °? More redness, swelling, or pain. °? Fluid or blood. °? Warmth. °? Pus or a bad smell. °Activity °· Rest as told by your doctor, usually for 1-2 days. °· Do not lift anything that is heavier than 10 lbs. (4.5 kg) or as told by your doctor. °· Do not drive for 24 hours if you were given a medicine to help you relax (sedative). °· Do not drive or use heavy machinery while taking prescription pain medicine. °General instructions ° °· Go back to your normal activities as told by your doctor, usually in about a week. Ask your doctor what activities are safe for you. °· If the insertion area starts to bleed, lie flat and put pressure on the area. If the bleeding does not stop, get help right away. This is an emergency. °· Drink enough fluid to keep your pee (urine) clear or pale yellow. °· Take over-the-counter and prescription medicines only as told by your doctor. °· Keep all follow-up visits as told by your doctor. This is important. °Contact a doctor if: °· You have a fever. °· You have chills. °· You have more redness, swelling, or pain around your insertion area. °· You have fluid or blood coming from your insertion area. °· The insertion area feels warm to the touch. °· You have pus or a bad smell coming from your insertion area. °· You have more bruising around the insertion area. °· Blood collects in the tissue around the insertion area (hematoma) that may be painful to the touch. °Get  help right away if: °· You have a lot of pain in the insertion area. °· The insertion area swells very fast. °· The insertion area is bleeding, and the bleeding does not stop after holding steady pressure on the area. °· The area near or just beyond the insertion area becomes pale, cool, tingly, or numb. °These symptoms may be an emergency. Do not wait to see if the symptoms will go away. Get medical help right away. Call your local emergency services (911 in the [place] Do not drive yourself to the hospital. °Summary °· After the procedure, it is common to have bruising and tenderness at the long, thin tube insertion area. °· After the procedure, it is important to rest and drink plenty of fluids. °· Do not take baths, swim, or use a hot tub until your doctor says it is okay to do so. You may shower 24-48 hours after the procedure or as told by your doctor. °· If the insertion area starts to bleed, lie flat and put pressure on the area. If the bleeding does not stop, get help right away. This is an emergency. °This information is not intended to replace advice given to you by your health care provider. Make sure you discuss any questions you have with your health care provider. °Document Released: 04/20/2008 Document Revised: 01/04/2017 Document Reviewed: 01/17/2016 °Elsevier Patient Education © 2020 [employer] ° ° ° °Moderate Conscious Sedation, Adult, Care After °These instructions provide you with information about caring for yourself after your procedure. Your health care provider may also give you more specific instructions. Your treatment has been planned according to current medical practices, but problems sometimes occur. Call   your health care provider if you have any problems or questions after your procedure. °What can I expect after the procedure? °After your procedure, it is common: °· To feel sleepy for several hours. °· To feel clumsy and have poor balance for several hours. °· To have poor judgment  for several hours. °· To vomit if you eat too soon. °Follow these instructions at home: °For at least 24 hours after the procedure: ° °· Do not: °? Participate in activities where you could fall or become injured. °? Drive. °? Use heavy machinery. °? Drink alcohol. °? Take sleeping pills or medicines that cause drowsiness. °? Make important decisions or sign legal documents. °? Take care of children on your own. °· Rest. °Eating and drinking °· Follow the diet recommended by your health care provider. °· If you vomit: °? Drink water, juice, or soup when you can drink without vomiting. °? Make sure you have little or no nausea before eating solid foods. °General instructions °· Have a responsible adult stay with you until you are awake and alert. °· Take over-the-counter and prescription medicines only as told by your health care provider. °· If you smoke, do not smoke without supervision. °· Keep all follow-up visits as told by your health care provider. This is important. °Contact a health care provider if: °· You keep feeling nauseous or you keep vomiting. °· You feel light-headed. °· You develop a rash. °· You have a fever. °Get help right away if: °· You have trouble breathing. °This information is not intended to replace advice given to you by your health care provider. Make sure you discuss any questions you have with your health care provider. °Document Released: 11/12/2012 Document Revised: 01/04/2017 Document Reviewed: 05/14/2015 °Elsevier Patient Education © 2020 [employer] ° °

## 2018-09-29 ENCOUNTER — Ambulatory Visit: Payer: Medicare Other | Admitting: Nurse Practitioner

## 2018-09-30 ENCOUNTER — Other Ambulatory Visit: Payer: Self-pay

## 2018-09-30 ENCOUNTER — Telehealth (INDEPENDENT_AMBULATORY_CARE_PROVIDER_SITE_OTHER): Payer: Medicare Other | Admitting: Cardiovascular Disease

## 2018-09-30 ENCOUNTER — Encounter: Payer: Self-pay | Admitting: Cardiovascular Disease

## 2018-09-30 VITALS — BP 138/62 | HR 61 | Ht 64.0 in | Wt 244.0 lb

## 2018-09-30 DIAGNOSIS — I10 Essential (primary) hypertension: Secondary | ICD-10-CM

## 2018-09-30 DIAGNOSIS — E785 Hyperlipidemia, unspecified: Secondary | ICD-10-CM | POA: Diagnosis not present

## 2018-09-30 DIAGNOSIS — I2510 Atherosclerotic heart disease of native coronary artery without angina pectoris: Secondary | ICD-10-CM

## 2018-09-30 DIAGNOSIS — I5032 Chronic diastolic (congestive) heart failure: Secondary | ICD-10-CM

## 2018-09-30 DIAGNOSIS — I25119 Atherosclerotic heart disease of native coronary artery with unspecified angina pectoris: Secondary | ICD-10-CM | POA: Diagnosis not present

## 2018-09-30 DIAGNOSIS — I110 Hypertensive heart disease with heart failure: Secondary | ICD-10-CM | POA: Diagnosis not present

## 2018-09-30 MED ORDER — NITROGLYCERIN 0.4 MG SL SUBL: 0.4000 mg | SUBLINGUAL_TABLET | SUBLINGUAL | 6 refills | Status: DC | PRN

## 2018-09-30 NOTE — Progress Notes (Signed)
Virtual Visit via Video Note   This visit type was conducted due to national recommendations for restrictions regarding the COVID-19 Pandemic (e.g. social distancing) in an effort to limit this patient's exposure and mitigate transmission in our community.  Due to her co-morbid illnesses, this patient is at least at moderate risk for complications without adequate follow up.  This format is felt to be most appropriate for this patient at this time.  All issues noted in this document were discussed and addressed.  A limited physical exam was performed with this format.  Please refer to the patient's chart for her consent to telehealth for [facility].   Date:  09/30/2018   ID:  Monica Ray, DOB September 04, 1945, MRN [id number]  Patient Location: Home Provider Location: Office  PCP:  Patrice Paradise, MD  Cardiologist:  Lorine Bears, MD  Electrophysiologist:  None   Evaluation Performed:  Follow-Up Visit  Chief Complaint: Chronic low back pain.  History of Present Illness:    Monica Ray is a 73 y.o. female who was seen via video visit today for follow-up regarding chronic diastolic heart failure and coronary artery disease. She has multiple chronic medical conditions that include prolonged history of type 2 diabetes , essential hypertension, sleep apnea with poor intolerance to CPAP, hyperlipidemia and obesity. She is a lifelong nonsmoker. There is no family history of coronary artery disease but there is family history of cancer. She had previous back surgery. She was evaluated in 2017 for significant exertional dyspnea. A right and left cardiac catheterization in November 2017 showed normal LV systolic function with mildly elevated left ventricular end-diastolic pressure and mild pulmonary hypertension. There was no evidence of obstructive coronary artery disease. Pulmonary pressure was 37/17 with a mean of 25 mmHg. LVEDP was 18 mmHg.  She was seen recently for chest pain and  worsening dyspnea.  She had CTA of the coronary arteries done which showed moderate LAD disease which was significant by FFR.  Coronary calcium score was 120.  Due to that, a left heart catheterization was done which showed 40% proximal LAD disease and 50% ostial first diagonal disease.  Ejection fraction was normal with moderately elevated left ventricular end-diastolic pressure in the setting of moderately elevated blood pressure. No recurrent episodes of chest pain.  She is trying to go back on CPAP machine again.  She is also trying to lose some weight.  Blood pressure has improved.  The patient does not have symptoms concerning for COVID-19 infection (fever, chills, cough, or new shortness of breath).    Past Medical History:  Diagnosis Date  . Anemia    before hysterectomy  . Anginal pain (HCC)   . Depression   . Diabetes mellitus, type 2 (HCC)   . DM retinopathy (HCC)   . Dyspnea   . Edema   . Hx of phlebitis   . Hyperlipidemia   . Hypertension   . Mild allergic rhinitis   . MRSA (methicillin resistant staph aureus) culture positive 03/2006  . OA (osteoarthritis)    hands  . Sleep apnea    Past Surgical History:  Procedure Laterality Date  . ABDOMINAL HYSTERECTOMY    . ADENOIDECTOMY    . APPENDECTOMY  1989  . barium enema  1998   neg CT  . CARDIAC CATHETERIZATION Bilateral 12/08/2015   Procedure: Right/Left Heart Cath and Coronary Angiography;  Surgeon: Iran Ouch, MD;  Location: [facility] INVASIVE CV LAB;  Service: Cardiovascular;  Laterality: Bilateral;  .  CHOLECYSTECTOMY  1989  . LEFT HEART CATH AND CORONARY ANGIOGRAPHY Left 09/15/2018   Procedure: LEFT HEART CATH AND CORONARY ANGIOGRAPHY;  Surgeon: Iran OuchArida, Muhammad A, MD;  Location: [facility] INVASIVE CV LAB;  Service: Cardiovascular;  Laterality: Left;  . LUMBAR LAMINECTOMY/DECOMPRESSION MICRODISCECTOMY N/A 11/25/2014   Procedure: LUMBAR LAMINECTOMY/DECOMPRESSION MICRODISCECTOMY LUMBAR TWO-THREE, LUMBAR THREE-FOUR ;   Surgeon: Tressie StalkerJeffrey Jenkins, MD;  Location: [facility] NEURO [facility]S;  Service: Neurosurgery;  Laterality: N/A;  . ROTATOR CUFF REPAIR  08/2001   right     Current Meds  Medication Sig  . aspirin EC 81 MG tablet Take 1 tablet (81 mg total) by mouth daily.  . Cholecalciferol (VITAMIN D3) 125 MCG (5000 UT) TABS Take 5,000 Units by mouth daily.   . clotrimazole-betamethasone (LOTRISONE) cream Apply 1 application topically 2 (two) times daily as needed (rash).   . Cyanocobalamin (VITAMIN B12) 3000 MCG SUBL Take 3,000 mg by mouth daily.  Monica Ray. diltiazem (CARDIZEM CD) 120 MG 24 hr capsule Take 120 mg by mouth daily.   . DULoxetine (CYMBALTA) 60 MG capsule Take 60 mg by mouth daily.  . furosemide (LASIX) 40 MG tablet TAKE 1 TABLET BY MOUTH ONCE A DAY (Patient taking differently: Take 40 mg by mouth daily. )  . insulin detemir (LEVEMIR) 100 UNIT/ML injection Inject 40-65 Units into the skin See admin instructions. 40 units in the morning and 65 units in the evening  . insulin lispro (HUMALOG) 100 UNIT/ML injection Inject 24-36 Units into the skin 3 (three) times daily with meals. 24 units before breakfast and lunch and 28 units before supper - plus sliding scale (4 units per 50 over a target of 150)  . irbesartan (AVAPRO) 300 MG tablet Take 300 mg by mouth daily.   . meloxicam (MOBIC) 7.5 MG tablet TAKE ONE CAPSULE BY MOUTH DAILY (Patient taking differently: Take 7.5 mg by mouth daily. )  . OVER THE COUNTER MEDICATION Apply 1 application topically at bedtime as needed (back pain). CBD cream  . pioglitazone (ACTOS) 45 MG tablet Take 22.5 mg by mouth daily.   . rosuvastatin (CRESTOR) 40 MG tablet Take 1 tablet (40 mg total) by mouth daily.     Allergies:   Tegaderm ag mesh [silver], Amoxicillin, Metformin, Oxycodone, Tape, Azithromycin, Clindamycin, Fluconazole, and Simvastatin   Social History   Tobacco Use  . Smoking status: Never Smoker  . Smokeless tobacco: Never Used  Substance Use Topics  . Alcohol use: No   . Drug use: No     Family Hx: The patient's family history includes Alcohol abuse in her father and mother; Aneurysm in her father and paternal grandmother; Breast cancer in her paternal aunt; Cancer in her father; Cancer (age of onset: 8785) in her mother; Diabetes in her maternal grandmother and mother.  ROS:   Please see the history of present illness.     All other systems reviewed and are negative.   Prior CV studies:   The following studies were reviewed today:  Recent cardiac catheterization was reviewed with her.  Labs/Other Tests and Data Reviewed:    EKG:  No ECG reviewed.  Recent Labs: 09/11/2018: BUN 30; Creatinine, Ser 1.26; Hemoglobin 14.2; Platelets 272; Potassium 3.9; Sodium 139   Recent Lipid Panel Lab Results  Component Value Date/Time   CHOL 125 10/07/2013 08:12 AM   TRIG 103.0 10/07/2013 08:12 AM   HDL 33.90 (L) 10/07/2013 08:12 AM   CHOLHDL 4 10/07/2013 08:12 AM   LDLCALC 71 10/07/2013 08:12 AM    Wt  Readings from Last 3 Encounters:  09/30/18 244 lb (110.7 kg)  09/15/18 246 lb 14.6 oz (112 kg)  09/02/18 247 lb (112 kg)     Objective:    Vital Signs:  BP 138/62 (BP Location: Left Arm, Patient Position: Sitting, Cuff Size: Normal)   Pulse 61   Ht 5\' 4"  (1.626 m)   Wt 244 lb (110.7 kg)   BMI 41.88 kg/m    VITAL SIGNS:  reviewed GEN:  no acute distress EYES:  sclerae anicteric, EOMI - Extraocular Movements Intact RESPIRATORY:  normal respiratory effort, symmetric expansion SKIN:  no rash, lesions or ulcers. MUSCULOSKELETAL:  no obvious deformities. NEURO:  alert and oriented x 3, no obvious focal deficit PSYCH:  normal affect  Right arm without ecchymosis.  No tenderness reported by the patient.  ASSESSMENT & PLAN:    1.   Chronic diastolic heart failure: Recent LVEDP was moderately elevated in the setting of elevated blood pressure.  Recommend continuing current dose of furosemide 40 mg once daily.  I discussed with her the importance of  low-sodium diet and weight loss. I think treating sleep apnea is also going to help.  2.  Coronary artery disease with other forms of angina: Recent cardiac catheterization showed moderate LAD/diagonal disease for which I recommend aggressive medical therapy.  No revascularization is indicated.  She reports occasional episodes of substernal chest pain and thus I prescribed nitroglycerin to be used as needed.  2. Essential hypertension: Blood pressure is controlled on current medications.  3. Hyperlipidemia: Continue treatment with rosuvastatin.  I reviewed most recent lipid profile from February which showed an LDL of 80 and triglyceride of 136.    COVID-19 Education: The signs and symptoms of COVID-19 were discussed with the patient and how to seek care for testing (follow up with PCP or arrange E-visit).  The importance of social distancing was discussed today.  Time:   Today, I have spent 11 minutes with the patient with telehealth technology discussing the above problems.     Medication Adjustments/Labs and Tests Ordered: Current medicines are reviewed at length with the patient today.  Concerns regarding medicines are outlined above.   Tests Ordered: No orders of the defined types were placed in this encounter.   Medication Changes: No orders of the defined types were placed in this encounter.   Disposition: Office follow-up in 4 months.  Signed, Kathlyn Sacramento, MD  09/30/2018 3:20 PM    [place]

## 2018-09-30 NOTE — Patient Instructions (Signed)
Medication Instructions:  Nitroglycerin 0.4 mg sublingual as needed for chest pain as instructed If you need a refill on your cardiac medications before your next appointment, please call your pharmacy.   Lab work: None If you have labs (blood work) drawn today and your tests are completely normal, you will receive your results only by: Marland Kitchen MyChart Message (if you have MyChart) OR . A paper copy in the mail If you have any lab test that is abnormal or we need to change your treatment, we will call you to review the results.  Testing/Procedures: None  Follow-Up: At [facility], you and your health needs are our priority.  As part of our continuing mission to provide you with exceptional heart care, we have created designated Provider Care Teams.  These Care Teams include your primary Cardiologist (physician) and Advanced Practice Providers (APPs -  Physician Assistants and Nurse Practitioners) who all work together to provide you with the care you need, when you need it. You will need a follow up appointment in 4 months.  Please call our office 2 months in advance to schedule this appointment.  You may see Kathlyn Sacramento, MD or one of the following Advanced Practice Providers on your designated Care Team:   Murray Hodgkins, NP Christell Faith, PA-C . Marrianne Mood, PA-C

## 2018-10-08 ENCOUNTER — Other Ambulatory Visit: Payer: Self-pay | Admitting: Physician Assistant

## 2018-10-08 DIAGNOSIS — Z1231 Encounter for screening mammogram for malignant neoplasm of breast: Secondary | ICD-10-CM

## 2019-01-19 ENCOUNTER — Ambulatory Visit
Admission: RE | Admit: 2019-01-19 | Discharge: 2019-01-19 | Disposition: A | Discharge status: Home / Self Care | Payer: Medicare Other | Source: Ambulatory Visit | Attending: Physician Assistant | Admitting: Physician Assistant

## 2019-01-19 DIAGNOSIS — Z1231 Encounter for screening mammogram for malignant neoplasm of breast: Secondary | ICD-10-CM | POA: Diagnosis present

## 2019-02-06 DIAGNOSIS — Z9841 Cataract extraction status, right eye: Secondary | ICD-10-CM

## 2019-02-06 DIAGNOSIS — Z9842 Cataract extraction status, left eye: Secondary | ICD-10-CM

## 2019-02-06 HISTORY — DX: Cataract extraction status, right eye: Z98.41

## 2019-02-06 HISTORY — DX: Cataract extraction status, right eye: Z98.42

## 2019-02-07 NOTE — Progress Notes (Addendum)
Office Visit    Patient Name: Monica Ray Date of Encounter: 02/11/2019  Primary Care Provider:  Patrice Paradise, MD Primary Cardiologist:  Lorine Bears, MD  Chief Complaint    74 yo female with history of nonobstructive CAD by [facility] 2017 and 09/2018, pulmonary HTN, uncontrolled DM2, HTN, HLD, and OSA on CPAP who presents today for follow-up of CP.  Past Medical History    Past Medical History:  Diagnosis Date  . Anemia    before hysterectomy  . Anginal pain (HCC)   . Depression   . Diabetes mellitus, type 2 (HCC)   . DM retinopathy (HCC)   . Dyspnea   . Edema   . Hx of phlebitis   . Hyperlipidemia   . Hypertension   . Mild allergic rhinitis   . MRSA (methicillin resistant staph aureus) culture positive 03/2006  . OA (osteoarthritis)    hands  . Sleep apnea    Past Surgical History:  Procedure Laterality Date  . ABDOMINAL HYSTERECTOMY    . ADENOIDECTOMY    . APPENDECTOMY  1989  . barium enema  1998   neg CT  . CARDIAC CATHETERIZATION Bilateral 12/08/2015   Procedure: Right/Left Heart Cath and Coronary Angiography;  Surgeon: Iran Ouch, MD;  Location: [facility] INVASIVE CV LAB;  Service: Cardiovascular;  Laterality: Bilateral;  . CHOLECYSTECTOMY  1989  . LEFT HEART CATH AND CORONARY ANGIOGRAPHY Left 09/15/2018   Procedure: LEFT HEART CATH AND CORONARY ANGIOGRAPHY;  Surgeon: Iran Ouch, MD;  Location: [facility] INVASIVE CV LAB;  Service: Cardiovascular;  Laterality: Left;  . LUMBAR LAMINECTOMY/DECOMPRESSION MICRODISCECTOMY N/A 11/25/2014   Procedure: LUMBAR LAMINECTOMY/DECOMPRESSION MICRODISCECTOMY LUMBAR TWO-THREE, LUMBAR THREE-FOUR ;  Surgeon: Tressie Stalker, MD;  Location: [facility] NEURO ORS;  Service: Neurosurgery;  Laterality: N/A;  . ROTATOR CUFF REPAIR  08/2001   right    Allergies  Allergies  Allergen Reactions  . Tegaderm Ag Mesh [Silver] Rash  . Amoxicillin Hives and Rash    Did it involve swelling of the face/tongue/throat, SOB, or low BP?  No Did it involve sudden or severe rash/hives, skin peeling, or any reaction on the inside of your mouth or nose? No Did you need to seek medical attention at a hospital or doctor's office? No When did it last happen?~2015 If all above answers are "NO", may proceed with cephalosporin use.   . Metformin Diarrhea and Nausea And Vomiting  . Oxycodone Itching  . Tape Other (See Comments)    Caused burning sensation  . Azithromycin Rash and Itching  . Clindamycin Hives and Rash  . Fluconazole Itching  . Simvastatin Other (See Comments)    Back pain    History of Present Illness    74 yo female with PMH as above. She was evaluated in 2017 for exertional dyspnea with [facility] showing no evidence of obstructive CAD. RHC showed mild pulmonary HTN. She was seen 08/2018 in the office and noted increasing sx with plan for coronary CTA.  CTA showed CAC score 929 or 96% involving RCA and LAD and CAD-RADS 2 CAD with worst lesions involving the p/mLAD. It also noted severe mitral annular calcification and nl Ao root with calcific atherosclerosis. FFR CT suggested hemodynamic significant CAD in m/dLAD and OM1 with referral for cath. She underwent 09/2018 catheterization that showed  Moderate 1v nonobstructive CAD, nl LVSF, and moderately elevated LVEDP in the setting of moderately elevated systemic blood pressure. Recommendations were thus for aggressive medical therapy and BP control.  Today,  she reports she has been doing well from a cardiac standpoint. No recent CP, racing HR, or palpitations. No recent SOB/DOE. She reports using her CPAP machine with overall "good sleep scores." She denies any significant LEE, abdominal distention, orthopnea, PND, or early satiety. She has made significant changes to her diet /lifestyle and has noticed benefits and is happy with the change. She has a friend helping her to fix heart healthy and low sodium foods. She does note some reflux with the new changes to her diet but  feels they will dissipate as her body adjusts. She walks daily without angina or DOE. No syncope. She notes a brief episode of dizziness while blowing her nose, lasting only seconds, and without recurrence. She reports medication compliance and is tolerating her medications well. Current BP well controlled.  She reports that she is staying safe during the COVID-19 pandemic. She has never smoked and does not drink alcohol.  Home Medications    Prior to Admission medications   Medication Sig Start Date End Date Taking? Authorizing Provider  aspirin EC 81 MG tablet Take 1 tablet (81 mg total) by mouth daily. 09/05/18   Dunn, Areta Haber, PA-C  Cholecalciferol (VITAMIN D3) 125 MCG (5000 UT) TABS Take 5,000 Units by mouth daily.     [provider]  clotrimazole-betamethasone (LOTRISONE) cream Apply 1 application topically 2 (two) times daily as needed (rash).  12/21/15   [provider]  Cyanocobalamin (VITAMIN B12) 3000 MCG SUBL Take 3,000 mg by mouth daily.    [provider]  diltiazem (CARDIZEM CD) 120 MG 24 hr capsule Take 120 mg by mouth daily.  02/25/18 09/30/18  [provider]  DULoxetine (CYMBALTA) 60 MG capsule Take 60 mg by mouth daily.    [provider]  furosemide (LASIX) 40 MG tablet TAKE 1 TABLET BY MOUTH ONCE A DAY Patient taking differently: Take 40 mg by mouth daily.  08/03/16   Wellington Hampshire, MD  insulin detemir (LEVEMIR) 100 UNIT/ML injection Inject 40-65 Units into the skin See admin instructions. 40 units in the morning and 65 units in the evening    [provider]  insulin lispro (HUMALOG) 100 UNIT/ML injection Inject 24-36 Units into the skin 3 (three) times daily with meals. 24 units before breakfast and lunch and 28 units before supper - plus sliding scale (4 units per 50 over a target of 150)    [provider]  irbesartan (AVAPRO) 300 MG tablet Take 300 mg by mouth daily.  02/25/18 09/30/18  [provider]   meloxicam (MOBIC) 7.5 MG tablet TAKE ONE CAPSULE BY MOUTH DAILY Patient taking differently: Take 7.5 mg by mouth daily.  04/12/14   Tower, Wynelle Fanny, MD  nitroGLYCERIN (NITROSTAT) 0.4 MG SL tablet Place 1 tablet (0.4 mg total) under the tongue every 5 (five) minutes as needed for chest pain. 09/30/18 12/29/18  Wellington Hampshire, MD  OVER THE COUNTER MEDICATION Apply 1 application topically at bedtime as needed (back pain). CBD cream    [provider]  pioglitazone (ACTOS) 45 MG tablet Take 22.5 mg by mouth daily.     [provider]  rosuvastatin (CRESTOR) 40 MG tablet Take 1 tablet (40 mg total) by mouth daily. 08/18/18   Rise Mu, PA-C    Review of Systems    She denies chest pain, palpitations, dyspnea, pnd, orthopnea, n, v, syncope, edema, weight gain, or early satiety. One brief episode of dizziness lasting seconds as above and  without recurrence and most consistent with vasovagal etiology.  All other systems reviewed and are otherwise negative except as noted above.  Physical Exam    VS:  BP (!) 122/56 (BP Location: Left Arm, Patient Position: Sitting, Cuff Size: Large)   Pulse 69   Ht 5\' 4"  (1.626 m)   Wt 230 lb (104.3 kg)   SpO2 96%   BMI 39.48 kg/m  , BMI Body mass index is 39.48 kg/m. GEN: Well nourished, well developed, in no acute distress. HEENT: normal. Neck: Supple, no JVD, carotid bruits, or masses. Cardiac: RRR, no murmurs, rubs, or gallops. No clubbing, cyanosis, trace to no lower extremity edema.  Radials/DP/PT 2+ and equal bilaterally.  Respiratory:  Respirations regular and unlabored, clear to auscultation bilaterally. GI: Soft, nontender, nondistended, BS + x 4. MS: no deformity or atrophy. Skin: warm and dry, no rash. Neuro:  Strength and sensation are intact. Psych: Normal affect.  Accessory Clinical Findings    ECG personally reviewed by me today - NSR, prolonged PR interval, 69bpm, low voltage, baseline wander/artifact- no acute  changes.  Filed Weights   02/10/19 1435  Weight: 230 lb (104.3 kg)    Cath 09/15/2018  The left ventricular systolic function is normal.  LV end diastolic pressure is moderately elevated.  The left ventricular ejection fraction is 55-65% by visual estimate.  Mid LAD lesion is 30% stenosed.  Lat 1st Diag lesion is 50% stenosed.  Prox LAD lesion is 40% stenosed. 1.  Moderate one-vessel nonobstructive coronary artery disease. 2.  Normal LV systolic function. 3.  Moderately elevated left ventricular end-diastolic pressure in the setting of moderately elevated systemic blood pressure. Recommendations: Continue aggressive medical therapy. Recommend blood pressure control.  Continue furosemide.  Recent labs 09/11/2018: Na 139, K 3.9, glucose 62, Cr 1.26, BUN 30, Ca 9.4, WBC 8.0, Hgb 14.2, HCT 44.4, plts 272 2015 A1c 8.7; TSH 2.93 Cholesterol 125, HDL 33.90, LDL 71, TG 103.0  [facility] Labs Na 141, K 3, BUN 26, Cr 1.4,  WBC 5.6, Hgb 13.1, Hct 41.4, plts 245, A1C 7.5, total cholesterol 134, Tg 122, LDL 73  Assessment & Plan    Chronic diastolic heart failure, pulmonary HTN --No SOB and euvolemic on exam. Recent LVEDP moderately elevated in the setting of elevated BP. Continue current lasix. Recent BMET per [facility] shows Cr 1.2 and BUN 26, improved from that of previous labs. K 3.8 and stable from that of previous. Continue with lifestyle and dietary changes, including reported exercise, low salt / heart healthy diet, and CPAP.   CAD without angina --No recent CP. She has not needed her SL nitro Recent cath as above with moderate LAD/diag dz and recommendation for aggressive medical therapy. No revascularization indicated at this time.   Essential HTN --BP well controlled on current medications. Continue.  HLD, hypertriglyceridemia  --Continue statin. LDL improved on PCP recheck and 73 with goal LDL <70. TG 136  122.  OSA --On CPAP. Continue.   Disposition:  Follow-up in 6 months or sooner if needed.    11/11/2018, PA-C 02/11/2019, 11:50 AM

## 2019-02-10 ENCOUNTER — Ambulatory Visit (INDEPENDENT_AMBULATORY_CARE_PROVIDER_SITE_OTHER): Payer: Medicare Other | Admitting: Physician Assistant

## 2019-02-10 ENCOUNTER — Other Ambulatory Visit: Payer: Self-pay

## 2019-02-10 ENCOUNTER — Encounter: Payer: Self-pay | Admitting: Physician Assistant

## 2019-02-10 VITALS — BP 122/56 | HR 69 | Ht 64.0 in | Wt 230.0 lb

## 2019-02-10 DIAGNOSIS — I5032 Chronic diastolic (congestive) heart failure: Secondary | ICD-10-CM | POA: Diagnosis not present

## 2019-02-10 DIAGNOSIS — I2510 Atherosclerotic heart disease of native coronary artery without angina pectoris: Secondary | ICD-10-CM

## 2019-02-10 DIAGNOSIS — G4733 Obstructive sleep apnea (adult) (pediatric): Secondary | ICD-10-CM

## 2019-02-10 DIAGNOSIS — E785 Hyperlipidemia, unspecified: Secondary | ICD-10-CM | POA: Diagnosis not present

## 2019-02-10 DIAGNOSIS — Z9989 Dependence on other enabling machines and devices: Secondary | ICD-10-CM

## 2019-02-10 DIAGNOSIS — I10 Essential (primary) hypertension: Secondary | ICD-10-CM

## 2019-02-10 DIAGNOSIS — I272 Pulmonary hypertension, unspecified: Secondary | ICD-10-CM

## 2019-02-10 NOTE — Patient Instructions (Signed)
Medication Instructions:  Your physician recommends that you continue on your current medications as directed. Please refer to the Current Medication list given to you today.  *If you need a refill on your cardiac medications before your next appointment, please call your pharmacy*  Lab Work: None ordered  If you have labs (blood work) drawn today and your tests are completely normal, you will receive your results only by: Marland Kitchen MyChart Message (if you have MyChart) OR . A paper copy in the mail If you have any lab test that is abnormal or we need to change your treatment, we will call you to review the results.  Testing/Procedures: None ordered   Follow-Up: At [facility], you and your health needs are our priority.  As part of our continuing mission to provide you with exceptional heart care, we have created designated Provider Care Teams.  These Care Teams include your primary Cardiologist (physician) and Advanced Practice Providers (APPs -  Physician Assistants and Nurse Practitioners) who all work together to provide you with the care you need, when you need it.  Your next appointment:   6 month(s)  The format for your next appointment:   In Person  Provider:    You may see Lorine Bears, MD or Marisue Ivan, PA-C.

## 2019-04-30 ENCOUNTER — Other Ambulatory Visit: Payer: Self-pay

## 2019-04-30 ENCOUNTER — Encounter: Payer: Self-pay | Admitting: Ophthalmology

## 2019-05-07 NOTE — Discharge Instructions (Signed)

## 2019-05-08 ENCOUNTER — Other Ambulatory Visit: Payer: Self-pay

## 2019-05-08 ENCOUNTER — Other Ambulatory Visit
Admission: RE | Admit: 2019-05-08 | Discharge: 2019-05-08 | Disposition: A | Discharge status: Home / Self Care | Payer: Medicare Other | Source: Ambulatory Visit | Attending: Ophthalmology | Admitting: Ophthalmology

## 2019-05-08 DIAGNOSIS — Z01812 Encounter for preprocedural laboratory examination: Secondary | ICD-10-CM | POA: Insufficient documentation

## 2019-05-08 DIAGNOSIS — Z20822 Contact with and (suspected) exposure to covid-19: Secondary | ICD-10-CM | POA: Diagnosis not present

## 2019-05-08 LAB — SARS CORONAVIRUS 2 (TAT 6-24 HRS): SARS Coronavirus 2: NEGATIVE

## 2019-05-12 ENCOUNTER — Other Ambulatory Visit: Payer: Self-pay

## 2019-05-12 ENCOUNTER — Ambulatory Visit: Payer: Medicare Other | Admitting: Anesthesiology

## 2019-05-12 ENCOUNTER — Encounter
Admission: RE | Disposition: A | Discharge status: Home / Self Care | Payer: Self-pay | Source: Home / Self Care | Attending: Ophthalmology

## 2019-05-12 ENCOUNTER — Encounter: Payer: Self-pay | Admitting: Ophthalmology

## 2019-05-12 ENCOUNTER — Ambulatory Visit
Admission: RE | Admit: 2019-05-12 | Discharge: 2019-05-12 | Disposition: A | Discharge status: Home / Self Care | Payer: Medicare Other | Attending: Ophthalmology | Admitting: Ophthalmology

## 2019-05-12 DIAGNOSIS — Z881 Allergy status to other antibiotic agents status: Secondary | ICD-10-CM | POA: Diagnosis not present

## 2019-05-12 DIAGNOSIS — E7800 Pure hypercholesterolemia, unspecified: Secondary | ICD-10-CM | POA: Diagnosis not present

## 2019-05-12 DIAGNOSIS — I10 Essential (primary) hypertension: Secondary | ICD-10-CM | POA: Insufficient documentation

## 2019-05-12 DIAGNOSIS — Z885 Allergy status to narcotic agent status: Secondary | ICD-10-CM | POA: Insufficient documentation

## 2019-05-12 DIAGNOSIS — Z888 Allergy status to other drugs, medicaments and biological substances status: Secondary | ICD-10-CM | POA: Insufficient documentation

## 2019-05-12 DIAGNOSIS — Z7982 Long term (current) use of aspirin: Secondary | ICD-10-CM | POA: Diagnosis not present

## 2019-05-12 DIAGNOSIS — F329 Major depressive disorder, single episode, unspecified: Secondary | ICD-10-CM | POA: Insufficient documentation

## 2019-05-12 DIAGNOSIS — Z794 Long term (current) use of insulin: Secondary | ICD-10-CM | POA: Insufficient documentation

## 2019-05-12 DIAGNOSIS — Z791 Long term (current) use of non-steroidal anti-inflammatories (NSAID): Secondary | ICD-10-CM | POA: Insufficient documentation

## 2019-05-12 DIAGNOSIS — Z79899 Other long term (current) drug therapy: Secondary | ICD-10-CM | POA: Diagnosis not present

## 2019-05-12 DIAGNOSIS — H2512 Age-related nuclear cataract, left eye: Secondary | ICD-10-CM | POA: Insufficient documentation

## 2019-05-12 DIAGNOSIS — M1990 Unspecified osteoarthritis, unspecified site: Secondary | ICD-10-CM | POA: Insufficient documentation

## 2019-05-12 DIAGNOSIS — Z880 Allergy status to penicillin: Secondary | ICD-10-CM | POA: Insufficient documentation

## 2019-05-12 DIAGNOSIS — G4730 Sleep apnea, unspecified: Secondary | ICD-10-CM | POA: Insufficient documentation

## 2019-05-12 DIAGNOSIS — E1136 Type 2 diabetes mellitus with diabetic cataract: Secondary | ICD-10-CM | POA: Diagnosis not present

## 2019-05-12 HISTORY — PX: CATARACT EXTRACTION W/PHACO: SHX586

## 2019-05-12 HISTORY — DX: Gastro-esophageal reflux disease without esophagitis: K21.9

## 2019-05-12 LAB — GLUCOSE, CAPILLARY
Glucose-Capillary: 142 mg/dL — ABNORMAL HIGH (ref 70–99)
Glucose-Capillary: 166 mg/dL — ABNORMAL HIGH (ref 70–99)

## 2019-05-12 SURGERY — PHACOEMULSIFICATION, CATARACT, WITH IOL INSERTION
Anesthesia: Monitor Anesthesia Care | Site: Eye | Laterality: Left

## 2019-05-12 MED ORDER — TETRACAINE HCL 0.5 % OP SOLN
1.0000 [drp] | OPHTHALMIC | Status: DC | PRN
  Administered 2019-05-12 (×3): 1 [drp] via OPHTHALMIC

## 2019-05-12 MED ORDER — MOXIFLOXACIN HCL 0.5 % OP SOLN
OPHTHALMIC | Status: DC | PRN
  Administered 2019-05-12: 0.2 mL via OPHTHALMIC

## 2019-05-12 MED ORDER — ARMC OPHTHALMIC DILATING DROPS
1.0000 "application " | OPHTHALMIC | Status: DC | PRN
  Administered 2019-05-12 (×3): 1 via OPHTHALMIC

## 2019-05-12 MED ORDER — LIDOCAINE HCL (PF) 2 % IJ SOLN
INTRAOCULAR | Status: DC | PRN
  Administered 2019-05-12: 09:00:00 2 mL via INTRAOCULAR

## 2019-05-12 MED ORDER — MIDAZOLAM HCL 2 MG/2ML IJ SOLN
INTRAMUSCULAR | Status: DC | PRN
  Administered 2019-05-12 (×2): .5 mg via INTRAVENOUS
  Administered 2019-05-12: 1 mg via INTRAVENOUS

## 2019-05-12 MED ORDER — BRIMONIDINE TARTRATE-TIMOLOL 0.2-0.5 % OP SOLN
OPHTHALMIC | Status: DC | PRN
  Administered 2019-05-12: 1 [drp] via OPHTHALMIC

## 2019-05-12 MED ORDER — ACETAMINOPHEN 325 MG PO TABS: 325.0000 mg | ORAL_TABLET | Freq: Once | ORAL | Status: DC

## 2019-05-12 MED ORDER — FENTANYL CITRATE (PF) 100 MCG/2ML IJ SOLN
INTRAMUSCULAR | Status: DC | PRN
  Administered 2019-05-12: 25 ug via INTRAVENOUS
  Administered 2019-05-12: 50 ug via INTRAVENOUS
  Administered 2019-05-12: 25 ug via INTRAVENOUS

## 2019-05-12 MED ORDER — TRYPAN BLUE 0.06 % OP SOLN
OPHTHALMIC | Status: DC | PRN
  Administered 2019-05-12: 0.5 mL via INTRAOCULAR

## 2019-05-12 MED ORDER — LACTATED RINGERS IV SOLN: 10.0000 mL/h | INTRAVENOUS | Status: DC

## 2019-05-12 MED ORDER — NEOMYCIN-POLYMYXIN-DEXAMETH 3.5-10000-0.1 OP OINT
TOPICAL_OINTMENT | OPHTHALMIC | Status: DC | PRN
  Administered 2019-05-12: 1 via OPHTHALMIC

## 2019-05-12 MED ORDER — EPINEPHRINE PF 1 MG/ML IJ SOLN
INTRAOCULAR | Status: DC | PRN
  Administered 2019-05-12: 105 mL via OPHTHALMIC

## 2019-05-12 MED ORDER — NA CHONDROIT SULF-NA HYALURON 40-17 MG/ML IO SOLN
INTRAOCULAR | Status: DC | PRN
  Administered 2019-05-12: 1 mL via INTRAOCULAR

## 2019-05-12 MED ORDER — ACETAMINOPHEN 160 MG/5ML PO SOLN: 325.0000 mg | Freq: Once | ORAL | Status: DC

## 2019-05-12 MED ORDER — TETRACAINE 0.5 % OP SOLN OPTIME - NO CHARGE
OPHTHALMIC | Status: DC | PRN
  Administered 2019-05-12: 2 [drp] via OPHTHALMIC

## 2019-05-12 MED ORDER — PROVISC 10 MG/ML IO SOLN
INTRAOCULAR | Status: DC | PRN
  Administered 2019-05-12: 0.55 mL via INTRAOCULAR

## 2019-05-12 SURGICAL SUPPLY — 16 items
DISSECTOR HYDRO NUCLEUS 50X22 (MISCELLANEOUS) ×12 IMPLANT
DRSG TEGADERM 2-3/8X2-3/4 SM (GAUZE/BANDAGES/DRESSINGS) ×3 IMPLANT
GLOVE BIOGEL PI IND STRL 8 (GLOVE) ×1 IMPLANT
GLOVE BIOGEL PI INDICATOR 8 (GLOVE) ×2
GOWN STRL REUS W/ TWL LRG LVL3 (GOWN DISPOSABLE) ×1 IMPLANT
GOWN STRL REUS W/ TWL XL LVL3 (GOWN DISPOSABLE) ×1 IMPLANT
GOWN STRL REUS W/TWL LRG LVL3 (GOWN DISPOSABLE) ×3
GOWN STRL REUS W/TWL XL LVL3 (GOWN DISPOSABLE) ×3
KNIFE 45D UP 2.3 (MISCELLANEOUS) ×3 IMPLANT
LENS IOL TECNIS ITEC 23.0 (Intraocular Lens) ×2 IMPLANT
PACK CATARACT (MISCELLANEOUS) ×3 IMPLANT
PACK DR. KING ARMS (PACKS) ×3 IMPLANT
PACK EYE AFTER SURG (MISCELLANEOUS) ×3 IMPLANT
SOLUTION OPHTHALMIC SALT (MISCELLANEOUS) ×3 IMPLANT
WATER STERILE IRR 250ML POUR (IV SOLUTION) ×3 IMPLANT
WIPE NON LINTING 3.25X3.25 (MISCELLANEOUS) ×3 IMPLANT

## 2019-05-12 NOTE — Op Note (Signed)
  PREOPERATIVE DIAGNOSIS:  Nuclear sclerotic cataract of the LEFT eye.   POSTOPERATIVE DIAGNOSIS:  Nuclear sclerotic cataract of the LEFT eye.   OPERATIVE PROCEDURE: Cataract surgery OS   SURGEON:  Elliot Cousin, MD.   ANESTHESIA:  Anesthesiologist: Ranee Gosselin, MD CRNA: Jiles Garter, CRNA  1.      Managed anesthesia care. 2.     0.75ml of Shugarcaine was instilled following the paracentesis   COMPLICATIONS:  None.   TECHNIQUE:   Divide and conquer   DESCRIPTION OF PROCEDURE:  The patient was examined and consented in the preoperative holding area where the aforementioned topical anesthesia was applied to the LEFT eye and then brought back to the Operating Room where the left eye was prepped and draped in the usual sterile ophthalmic fashion and a lid speculum was placed. A paracentesis was created with the side port blade, the anterior chamber was washed out with trypan blue to stain the anterior capsule, and the anterior chamber was filled with viscoelastic. A near clear corneal incision was performed with the steel keratome. A continuous curvilinear capsulorrhexis was performed with a cystotome followed by the capsulorrhexis forceps. Hydrodissection and hydrodelineation were carried out with BSS on a blunt cannula. The lens was removed in a divide and conquer  technique and the remaining cortical material was removed with the irrigation-aspiration handpiece. The capsular bag was inflated with viscoelastic and the lens was placed in the capsular bag without complication. The remaining viscoelastic was removed from the eye with the irrigation-aspiration handpiece. The wounds were hydrated. The anterior chamber was flushed and the eye was inflated to physiologic pressure. 0.49ml Vigamox was placed in the anterior chamber. The wounds were found to be water tight. The eye was dressed with Vigamox. The patient was given protective glasses to wear throughout the day and a shield with which to  sleep tonight. The patient was also given drops with which to begin a drop regimen today and will follow-up with me in one day. Implant Name Type Inv. Item Serial No. Manufacturer Lot No. LRB No. Used Action  LENS IOL DIOP 23.0 - [id number] Intraocular Lens LENS IOL DIOP 23.0 [id number] AMO  Left 1 Implanted    Procedure(s) with comments: CATARACT EXTRACTION PHACO AND INTRAOCULAR LENS PLACEMENT (IOC) LEFT VISION BLUE (Left) - 12.80 1:17.1  Electronically signed: Jacquelyne Quarry 05/12/2019 9:52 AM

## 2019-05-12 NOTE — Anesthesia Procedure Notes (Signed)
Procedure Name: MAC Date/Time: 05/12/2019 9:13 AM Performed by: Vanetta Shawl, CRNA Pre-anesthesia Checklist: Patient identified, Emergency Drugs available, Suction available, Timeout performed and Patient being monitored Patient Re-evaluated:Patient Re-evaluated prior to induction Oxygen Delivery Method: Nasal cannula Placement Confirmation: positive ETCO2

## 2019-05-12 NOTE — Anesthesia Postprocedure Evaluation (Signed)
Anesthesia Post Note  Patient: Monica Ray  Procedure(s) Performed: CATARACT EXTRACTION PHACO AND INTRAOCULAR LENS PLACEMENT ([place]) Ray VISION BLUE ([facility])     Patient location during evaluation: PACU Anesthesia Type: MAC Level of consciousness: awake and alert and oriented Pain management: satisfactory to patient Vital Signs Assessment: post-procedure vital signs reviewed and stable Respiratory status: spontaneous breathing, nonlabored ventilation and respiratory function stable Cardiovascular status: blood pressure returned to baseline and stable Postop Assessment: Adequate PO intake and No signs of nausea or vomiting Anesthetic complications: no    Raliegh Ip

## 2019-05-12 NOTE — Anesthesia Preprocedure Evaluation (Signed)
Anesthesia Evaluation  Patient identified by MRN, date of birth, ID band Patient awake    Reviewed: Allergy & Precautions, H&P , NPO status , Patient's Chart, lab work & pertinent test results  Airway Mallampati: II  TM Distance: >3 FB Neck ROM: full    Dental no notable dental hx.    Pulmonary shortness of breath, sleep apnea and Continuous Positive Airway Pressure Ventilation ,    Pulmonary exam normal breath sounds clear to auscultation       Cardiovascular hypertension, + CAD  Normal cardiovascular exam Rhythm:regular Rate:Normal     Neuro/Psych    GI/Hepatic GERD  ,  Endo/Other  diabetes, Oral Hypoglycemic Agents, Insulin Dependent  Renal/GU      Musculoskeletal   Abdominal   Peds  Hematology   Anesthesia Other Findings   Reproductive/Obstetrics                             Anesthesia Physical Anesthesia Plan  ASA: III  Anesthesia Plan: MAC   Post-op Pain Management:    Induction:   PONV Risk Score and Plan: 2 and Treatment may vary due to age or medical condition, TIVA and Midazolam  Airway Management Planned:   Additional Equipment:   Intra-op Plan:   Post-operative Plan:   Informed Consent: I have reviewed the patients History and Physical, chart, labs and discussed the procedure including the risks, benefits and alternatives for the proposed anesthesia with the patient or authorized representative who has indicated his/her understanding and acceptance.     Dental Advisory Given  Plan Discussed with: CRNA  Anesthesia Plan Comments:         Anesthesia Quick Evaluation

## 2019-05-12 NOTE — Transfer of Care (Signed)
Immediate Anesthesia Transfer of Care Note  Patient: Monica Ray  Procedure(s) Performed: CATARACT EXTRACTION PHACO AND INTRAOCULAR LENS PLACEMENT ([place]) Ray VISION BLUE ([facility])  Patient Location: PACU  Anesthesia Type: MAC  Level of Consciousness: awake, alert  and patient cooperative  Airway and Oxygen Therapy: Patient Spontanous Breathing  Post-op Assessment: Post-op Vital signs reviewed, Patient's Cardiovascular Status Stable, Respiratory Function Stable, Patent Airway and No signs of Nausea or vomiting  Post-op Vital Signs: Reviewed and stable  Complications: No apparent anesthesia complications

## 2019-05-12 NOTE — H&P (Signed)
   I have reviewed the patient's H&P and agree with its findings. There have been no interval changes.  Jahmari Esbenshade MD Ophthalmology 

## 2019-05-13 ENCOUNTER — Encounter: Payer: Self-pay | Admitting: *Deleted

## 2019-06-03 ENCOUNTER — Encounter: Payer: Self-pay | Admitting: Ophthalmology

## 2019-06-03 ENCOUNTER — Other Ambulatory Visit: Payer: Self-pay

## 2019-06-09 ENCOUNTER — Other Ambulatory Visit
Admission: RE | Admit: 2019-06-09 | Discharge: 2019-06-09 | Disposition: A | Discharge status: Home / Self Care | Payer: Medicare Other | Source: Ambulatory Visit | Attending: Ophthalmology | Admitting: Ophthalmology

## 2019-06-09 ENCOUNTER — Other Ambulatory Visit: Payer: Self-pay

## 2019-06-09 DIAGNOSIS — Z01812 Encounter for preprocedural laboratory examination: Secondary | ICD-10-CM | POA: Insufficient documentation

## 2019-06-09 DIAGNOSIS — Z20822 Contact with and (suspected) exposure to covid-19: Secondary | ICD-10-CM | POA: Insufficient documentation

## 2019-06-09 LAB — SARS CORONAVIRUS 2 (TAT 6-24 HRS): SARS Coronavirus 2: NEGATIVE

## 2019-06-09 NOTE — Discharge Instructions (Signed)

## 2019-06-11 ENCOUNTER — Encounter: Payer: Self-pay | Admitting: Ophthalmology

## 2019-06-11 ENCOUNTER — Ambulatory Visit: Payer: Medicare Other | Admitting: Anesthesiology

## 2019-06-11 ENCOUNTER — Encounter
Admission: RE | Disposition: A | Discharge status: Home / Self Care | Payer: Self-pay | Source: Home / Self Care | Attending: Ophthalmology

## 2019-06-11 ENCOUNTER — Ambulatory Visit
Admission: RE | Admit: 2019-06-11 | Discharge: 2019-06-11 | Disposition: A | Discharge status: Home / Self Care | Payer: Medicare Other | Attending: Ophthalmology | Admitting: Ophthalmology

## 2019-06-11 ENCOUNTER — Other Ambulatory Visit: Payer: Self-pay

## 2019-06-11 DIAGNOSIS — I10 Essential (primary) hypertension: Secondary | ICD-10-CM | POA: Insufficient documentation

## 2019-06-11 DIAGNOSIS — Z6839 Body mass index (BMI) 39.0-39.9, adult: Secondary | ICD-10-CM | POA: Diagnosis not present

## 2019-06-11 DIAGNOSIS — Z79899 Other long term (current) drug therapy: Secondary | ICD-10-CM | POA: Insufficient documentation

## 2019-06-11 DIAGNOSIS — E1136 Type 2 diabetes mellitus with diabetic cataract: Secondary | ICD-10-CM | POA: Diagnosis present

## 2019-06-11 DIAGNOSIS — E7800 Pure hypercholesterolemia, unspecified: Secondary | ICD-10-CM | POA: Diagnosis not present

## 2019-06-11 DIAGNOSIS — Z794 Long term (current) use of insulin: Secondary | ICD-10-CM | POA: Diagnosis not present

## 2019-06-11 DIAGNOSIS — F329 Major depressive disorder, single episode, unspecified: Secondary | ICD-10-CM | POA: Diagnosis not present

## 2019-06-11 DIAGNOSIS — Z86718 Personal history of other venous thrombosis and embolism: Secondary | ICD-10-CM | POA: Insufficient documentation

## 2019-06-11 DIAGNOSIS — Z7982 Long term (current) use of aspirin: Secondary | ICD-10-CM | POA: Insufficient documentation

## 2019-06-11 DIAGNOSIS — I2510 Atherosclerotic heart disease of native coronary artery without angina pectoris: Secondary | ICD-10-CM | POA: Diagnosis not present

## 2019-06-11 DIAGNOSIS — H2511 Age-related nuclear cataract, right eye: Secondary | ICD-10-CM | POA: Diagnosis not present

## 2019-06-11 DIAGNOSIS — Z791 Long term (current) use of non-steroidal anti-inflammatories (NSAID): Secondary | ICD-10-CM | POA: Insufficient documentation

## 2019-06-11 DIAGNOSIS — G4730 Sleep apnea, unspecified: Secondary | ICD-10-CM | POA: Insufficient documentation

## 2019-06-11 HISTORY — PX: CATARACT EXTRACTION W/PHACO: SHX586

## 2019-06-11 LAB — GLUCOSE, CAPILLARY
Glucose-Capillary: 165 mg/dL — ABNORMAL HIGH (ref 70–99)
Glucose-Capillary: 98 mg/dL (ref 70–99)

## 2019-06-11 SURGERY — PHACOEMULSIFICATION, CATARACT, WITH IOL INSERTION
Anesthesia: Monitor Anesthesia Care | Site: Eye | Laterality: Right

## 2019-06-11 MED ORDER — BRIMONIDINE TARTRATE-TIMOLOL 0.2-0.5 % OP SOLN
OPHTHALMIC | Status: DC | PRN
  Administered 2019-06-11: 1 [drp] via OPHTHALMIC

## 2019-06-11 MED ORDER — CHLORHEXIDINE GLUCONATE 0.12 % MT SOLN: 15.0000 mL | Freq: Once | OROMUCOSAL | Status: DC

## 2019-06-11 MED ORDER — NA CHONDROIT SULF-NA HYALURON 40-17 MG/ML IO SOLN
INTRAOCULAR | Status: DC | PRN
  Administered 2019-06-11: 1 mL via INTRAOCULAR

## 2019-06-11 MED ORDER — MIDAZOLAM HCL 2 MG/2ML IJ SOLN
INTRAMUSCULAR | Status: DC | PRN
  Administered 2019-06-11 (×2): 1 mg via INTRAVENOUS

## 2019-06-11 MED ORDER — TRYPAN BLUE 0.06 % OP SOLN
OPHTHALMIC | Status: DC | PRN
  Administered 2019-06-11: 0.5 mL via INTRAOCULAR

## 2019-06-11 MED ORDER — FENTANYL CITRATE (PF) 100 MCG/2ML IJ SOLN
INTRAMUSCULAR | Status: DC | PRN
  Administered 2019-06-11: 50 ug via INTRAVENOUS

## 2019-06-11 MED ORDER — TETRACAINE HCL 0.5 % OP SOLN
1.0000 [drp] | OPHTHALMIC | Status: DC | PRN
  Administered 2019-06-11 (×3): 1 [drp] via OPHTHALMIC

## 2019-06-11 MED ORDER — ORAL CARE MOUTH RINSE: 15.0000 mL | Freq: Once | OROMUCOSAL | Status: DC

## 2019-06-11 MED ORDER — LIDOCAINE HCL (PF) 2 % IJ SOLN
INTRAOCULAR | Status: DC | PRN
  Administered 2019-06-11: 1 mL via INTRAOCULAR

## 2019-06-11 MED ORDER — MOXIFLOXACIN HCL 0.5 % OP SOLN
OPHTHALMIC | Status: DC | PRN
  Administered 2019-06-11: 0.2 mL via OPHTHALMIC

## 2019-06-11 MED ORDER — TETRACAINE 0.5 % OP SOLN OPTIME - NO CHARGE
OPHTHALMIC | Status: DC | PRN
  Administered 2019-06-11: 09:00:00 2 [drp] via OPHTHALMIC

## 2019-06-11 MED ORDER — PROVISC 10 MG/ML IO SOLN
INTRAOCULAR | Status: DC | PRN
  Administered 2019-06-11: 0.55 mL via INTRAOCULAR

## 2019-06-11 MED ORDER — EPINEPHRINE PF 1 MG/ML IJ SOLN
INTRAOCULAR | Status: DC | PRN
  Administered 2019-06-11: 112 mL via OPHTHALMIC

## 2019-06-11 MED ORDER — ARMC OPHTHALMIC DILATING DROPS
1.0000 "application " | OPHTHALMIC | Status: DC | PRN
  Administered 2019-06-11 (×3): 1 via OPHTHALMIC

## 2019-06-11 SURGICAL SUPPLY — 17 items
DISSECTOR HYDRO NUCLEUS 50X22 (MISCELLANEOUS) ×12 IMPLANT
DRSG TEGADERM 2-3/8X2-3/4 SM (GAUZE/BANDAGES/DRESSINGS) ×3 IMPLANT
GLOVE BIOGEL PI IND STRL 8 (GLOVE) ×1 IMPLANT
GLOVE BIOGEL PI INDICATOR 8 (GLOVE) ×2
GOWN STRL REUS W/ TWL LRG LVL3 (GOWN DISPOSABLE) ×1 IMPLANT
GOWN STRL REUS W/ TWL XL LVL3 (GOWN DISPOSABLE) ×1 IMPLANT
GOWN STRL REUS W/TWL LRG LVL3 (GOWN DISPOSABLE) ×3
GOWN STRL REUS W/TWL XL LVL3 (GOWN DISPOSABLE) ×3
KNIFE 45D UP 2.3 (MISCELLANEOUS) ×3 IMPLANT
LENS IOL DIOP 22.5 (Intraocular Lens) ×3 IMPLANT
LENS IOL TECNIS MONO 22.5 (Intraocular Lens) ×1 IMPLANT
PACK CATARACT (MISCELLANEOUS) ×3 IMPLANT
PACK DR. KING ARMS (PACKS) ×3 IMPLANT
PACK EYE AFTER SURG (MISCELLANEOUS) ×3 IMPLANT
SOLUTION OPHTHALMIC SALT (MISCELLANEOUS) ×3 IMPLANT
WATER STERILE IRR 250ML POUR (IV SOLUTION) ×3 IMPLANT
WIPE NON LINTING 3.25X3.25 (MISCELLANEOUS) ×3 IMPLANT

## 2019-06-11 NOTE — Op Note (Signed)
  PREOPERATIVE DIAGNOSIS:  Nuclear sclerotic cataract of the RIGHT eye.   POSTOPERATIVE DIAGNOSIS:  Nuclear sclerotic cataract of the RIGHT eye.   OPERATIVE PROCEDURE: Cataract surgery OD   SURGEON:  Elliot Cousin, MD.   ANESTHESIA:  Anesthesiologist: Orrin Brigham, MD CRNA: Michaele Offer, CRNA  1.      Managed anesthesia care. 2.     0.57ml of Shugarcaine was instilled following the paracentesis   COMPLICATIONS:  None.   TECHNIQUE:   Divide and conquer   DESCRIPTION OF PROCEDURE:  The patient was examined and consented in the preoperative holding area where the aforementioned topical anesthesia was applied to the RIGHT eye and then brought back to the Operating Room where the RIGHT eye was prepped and draped in the usual sterile ophthalmic fashion and a lid speculum was placed. A paracentesis was created with the side port blade, the anterior chamber was washed out with trypan blue to stain the anterior capsule, and the anterior chamber was filled with viscoelastic. A near clear corneal incision was performed with the steel keratome. A continuous curvilinear capsulorrhexis was performed with a cystotome followed by the capsulorrhexis forceps. Hydrodissection and hydrodelineation were carried out with BSS on a blunt cannula. The lens was removed in a divide and conquer  technique and the remaining cortical material was removed with the irrigation-aspiration handpiece. The capsular bag was inflated with viscoelastic and the lens was placed in the capsular bag without complication. The remaining viscoelastic was removed from the eye with the irrigation-aspiration handpiece. The wounds were hydrated. The anterior chamber was flushed and the eye was inflated to physiologic pressure. 0.45ml Vigamox was placed in the anterior chamber. The wounds were found to be water tight. The eye was dressed with Vigamox. The patient was given protective glasses to wear throughout the day and a shield with which to  sleep tonight. The patient was also given drops with which to begin a drop regimen today and will follow-up with me in one day. Implant Name Type Inv. Item Serial No. Manufacturer Lot No. LRB No. Used Action  LENS IOL DIOP 22.5 - [id number] Intraocular Lens LENS IOL DIOP 22.5 [id number]  Right 1 Implanted    Procedure(s) with comments: CATARACT EXTRACTION PHACO AND INTRAOCULAR LENS PLACEMENT ([facility]) RIGHT DIABETIC VISION BLUE 12.91  01:21.2 (Right) - Diabetic - insulin and oral meds  Electronically signed: Alexes Lamarque 06/11/2019 9:41 AM

## 2019-06-11 NOTE — Transfer of Care (Signed)
Immediate Anesthesia Transfer of Care Note  Patient: Monica Ray  Procedure(s) Performed: CATARACT EXTRACTION PHACO AND INTRAOCULAR LENS PLACEMENT ([facility]) RIGHT DIABETIC VISION BLUE 12.91  01:21.2 (Right Eye)  Patient Location: PACU  Anesthesia Type: MAC  Level of Consciousness: awake, alert  and patient cooperative  Airway and Oxygen Therapy: Patient Spontanous Breathing and Patient connected to supplemental oxygen  Post-op Assessment: Post-op Vital signs reviewed, Patient's Cardiovascular Status Stable, Respiratory Function Stable, Patent Airway and No signs of Nausea or vomiting  Post-op Vital Signs: Reviewed and stable  Complications: No apparent anesthesia complications

## 2019-06-11 NOTE — Anesthesia Postprocedure Evaluation (Signed)
Anesthesia Post Note  Patient: Monica Ray  Procedure(s) Performed: CATARACT EXTRACTION PHACO AND INTRAOCULAR LENS PLACEMENT ([facility]) RIGHT DIABETIC VISION BLUE 12.91  01:21.2 (Right Eye)     Patient location during evaluation: PACU Anesthesia Type: MAC Level of consciousness: awake and alert Pain management: pain level controlled Vital Signs Assessment: post-procedure vital signs reviewed and stable Respiratory status: spontaneous breathing, nonlabored ventilation, respiratory function stable and patient connected to nasal cannula oxygen Cardiovascular status: stable and blood pressure returned to baseline Postop Assessment: no apparent nausea or vomiting Anesthetic complications: no    Rowene Suto

## 2019-06-11 NOTE — Anesthesia Preprocedure Evaluation (Signed)
Anesthesia Evaluation  Patient identified by MRN, date of birth, ID band Patient awake    Reviewed: NPO status   History of Anesthesia Complications Negative for: history of anesthetic complications  Airway Mallampati: II  TM Distance: >3 FB Neck ROM: full    Dental no notable dental hx.    Pulmonary sleep apnea and Continuous Positive Airway Pressure Ventilation ,    Pulmonary exam normal        Cardiovascular Exercise Tolerance: Good hypertension, (-) angina+ CAD  Normal cardiovascular exam  cath: 09/2018:  1.  Moderate one-vessel nonobstructive coronary artery disease. 2.  Normal LV systolic function. 3.  Moderately elevated left ventricular end-diastolic pressure in the setting of moderately elevated systemic blood pressure.  ekg: 09/2018: SR at 71;    Neuro/Psych Depression negative neurological ROS     GI/Hepatic Neg liver ROS, GERD  Controlled,  Endo/Other  diabetesMorbid obesity (bmi 39)  Renal/GU negative Renal ROS  negative genitourinary   Musculoskeletal  (+) Arthritis ,   Abdominal   Peds  Hematology negative hematology ROS (+)   Anesthesia Other Findings PCP note: Sheron Nightingale, PA - 02/12/2019   SARS Coronavirus  NEGATIVE      Reproductive/Obstetrics                             Anesthesia Physical Anesthesia Plan  ASA: III  Anesthesia Plan: MAC   Post-op Pain Management:    Induction:   PONV Risk Score and Plan: 2 and TIVA and Midazolam  Airway Management Planned:   Additional Equipment:   Intra-op Plan:   Post-operative Plan:   Informed Consent: I have reviewed the patients History and Physical, chart, labs and discussed the procedure including the risks, benefits and alternatives for the proposed anesthesia with the patient or authorized representative who has indicated his/her understanding and acceptance.       Plan Discussed with:  CRNA  Anesthesia Plan Comments:         Anesthesia Quick Evaluation

## 2019-06-11 NOTE — H&P (Signed)
   I have reviewed the patient's H&P and agree with its findings. There have been no interval changes.  Webber Michiels MD Ophthalmology 

## 2019-06-12 ENCOUNTER — Encounter: Payer: Self-pay | Admitting: *Deleted

## 2019-06-19 ENCOUNTER — Other Ambulatory Visit: Payer: Self-pay | Admitting: Physician Assistant

## 2019-08-18 ENCOUNTER — Other Ambulatory Visit: Payer: Self-pay

## 2019-08-18 ENCOUNTER — Encounter: Payer: Self-pay | Admitting: Family

## 2019-08-18 ENCOUNTER — Ambulatory Visit (INDEPENDENT_AMBULATORY_CARE_PROVIDER_SITE_OTHER): Payer: Medicare Other | Admitting: Family

## 2019-08-18 VITALS — BP 132/60 | HR 68 | Ht 64.0 in | Wt 230.0 lb

## 2019-08-18 DIAGNOSIS — Z9989 Dependence on other enabling machines and devices: Secondary | ICD-10-CM

## 2019-08-18 DIAGNOSIS — I10 Essential (primary) hypertension: Secondary | ICD-10-CM | POA: Diagnosis not present

## 2019-08-18 DIAGNOSIS — G4733 Obstructive sleep apnea (adult) (pediatric): Secondary | ICD-10-CM

## 2019-08-18 DIAGNOSIS — E785 Hyperlipidemia, unspecified: Secondary | ICD-10-CM

## 2019-08-18 DIAGNOSIS — I5032 Chronic diastolic (congestive) heart failure: Secondary | ICD-10-CM | POA: Diagnosis not present

## 2019-08-18 DIAGNOSIS — I25118 Atherosclerotic heart disease of native coronary artery with other forms of angina pectoris: Secondary | ICD-10-CM | POA: Diagnosis not present

## 2019-08-18 DIAGNOSIS — I272 Pulmonary hypertension, unspecified: Secondary | ICD-10-CM

## 2019-08-18 NOTE — Patient Instructions (Addendum)
Medication Instructions:  No medication changes today.   *If you need a refill on your cardiac medications before your next appointment, please call your pharmacy*   Lab Work: No lab work today. Your recent cholesterol numbers looked fantastic!  Testing/Procedures: Your EKG today shows normal sinus rhythm which is a great result.   Follow-Up: At [facility], you and your health needs are our priority.  As part of our continuing mission to provide you with exceptional heart care, we have created designated Provider Care Teams.  These Care Teams include your primary Cardiologist (physician) and Advanced Practice Providers (APPs -  Physician Assistants and Nurse Practitioners) who all work together to provide you with the care you need, when you need it.  We recommend signing up for the patient portal called "MyChart".  Sign up information is provided on this After Visit Summary.  MyChart is used to connect with patients for Virtual Visits (Telemedicine).  Patients are able to view lab/test results, encounter notes, upcoming appointments, etc.  Non-urgent messages can be sent to your provider as well.   To learn more about what you can do with MyChart, go to [email or web address].    Your next appointment:  In 6 months with Dr. Kirke Corin or APP  Other Instructions:  Consider talking with your primary care provider about physical therapy for your back

## 2019-08-18 NOTE — Progress Notes (Signed)
Office Visit    Patient Name: Monica Ray Date of Encounter: 08/18/2019  Primary Care Provider:  Patrice ParadiseMcLaughlin, Miriam K, MD Primary Cardiologist:  Lorine BearsMuhammad Arida, MD Electrophysiologist:  None   Chief Complaint    Monica BalboaMary W Ray is a 74 y.o. female with a [facility] of  [facility] and [facility], chronic diastolic heart failure,pulmonary hypertension, DM2, HTN, HLD, OSA on CPAP presents today for follow-up of coronary artery disease.  Past Medical History    Past Medical History:  Diagnosis Date  . Anemia    before hysterectomy  . Anginal pain (HCC)   . Depression   . Diabetes mellitus, type 2 (HCC)   . DM retinopathy (HCC)   . Dyspnea   . Edema   . GERD (gastroesophageal reflux disease)   . Hx of phlebitis   . Hyperlipidemia   . Hypertension   . Mild allergic rhinitis   . MRSA (methicillin resistant staph aureus) culture positive 03/2006  . OA (osteoarthritis)    hands  . Sleep apnea    CPAP   Past Surgical History:  Procedure Laterality Date  . ABDOMINAL HYSTERECTOMY    . ADENOIDECTOMY    . APPENDECTOMY  1989  . barium enema  1998   neg CT  . CARDIAC CATHETERIZATION Bilateral 12/08/2015   Procedure: Right/Left Heart Cath and Coronary Angiography;  Surgeon: Iran OuchMuhammad A Arida, MD;  Location: [facility] INVASIVE CV LAB;  Service: Cardiovascular;  Laterality: Bilateral;  . CATARACT EXTRACTION W/PHACO Left 05/12/2019   Procedure: CATARACT EXTRACTION PHACO AND INTRAOCULAR LENS PLACEMENT ([facility]) LEFT VISION BLUE;  Surgeon: Elliot CousinHarrow, Brian, MD;  [facility];  Service: Ophthalmology;  Laterality: Left;  12.80 1:17.1  . CATARACT EXTRACTION W/PHACO Right 06/11/2019   Procedure: CATARACT EXTRACTION PHACO AND INTRAOCULAR LENS PLACEMENT ([facility]) RIGHT DIABETIC VISION BLUE 12.91  01:21.2;  Surgeon: Elliot CousinHarrow, Brian, MD;  [facility];  Service: Ophthalmology;  Laterality: Right;  Diabetic - insulin and oral meds  . CHOLECYSTECTOMY   1989  . LEFT HEART CATH AND CORONARY ANGIOGRAPHY Left 09/15/2018   Procedure: LEFT HEART CATH AND CORONARY ANGIOGRAPHY;  Surgeon: Iran OuchArida, Muhammad A, MD;  Location: [facility] INVASIVE CV LAB;  Service: Cardiovascular;  Laterality: Left;  . LUMBAR LAMINECTOMY/DECOMPRESSION MICRODISCECTOMY N/A 11/25/2014   Procedure: LUMBAR LAMINECTOMY/DECOMPRESSION MICRODISCECTOMY LUMBAR TWO-THREE, LUMBAR THREE-FOUR ;  Surgeon: Tressie StalkerJeffrey Jenkins, MD;  Location: [facility];  Service: Neurosurgery;  Laterality: N/A;  . ROTATOR CUFF REPAIR  08/2001   right    Allergies  Allergies  Allergen Reactions  . Tegaderm Ag Mesh [Silver] Rash  . Amoxicillin Hives and Rash    Did it involve swelling of the face/tongue/throat, SOB, or low BP? No Did it involve sudden or severe rash/hives, skin peeling, or any reaction on the inside of your mouth or nose? No Did you need to seek medical attention at a hospital or doctor's office? No When did it last happen?~2015 If all above answers are "NO", may proceed with cephalosporin use.   . Metformin Diarrhea and Nausea And Vomiting  . Oxycodone Itching  . Tape Other (See Comments)    Caused burning sensation  . Azithromycin Rash and Itching  . Clindamycin Hives and Rash  . Fluconazole Itching  . Simvastatin Other (See Comments)    Back pain    History of Present Illness    Monica Ray is a 74 y.o. female with a [facility]ive [facility]  and 09/2018, pulmonary hypertension, chronic diastolic heart failure, DM2, HTN, HLD, OSA on CPAP last seen 02/2019 by Leafy Kindle, PA.   Evaluated in 2017 for exertional dyspnea with right and left heart cath showing no evidence of obstructive CAD, mild pulmonary hypertension.  Seen 08/2018 with increasing symptoms and plan for coronary CTA.  CTA showed calcium score of 929 or 96 percentile risk involving RCA and LAD.  Also noted severe mitral annular calcification and normal aortic root with calcific  atherosclerosis.  FFR suggested hemodynamic significant CAD in mid LAD and OM1 with referral for cath.  09/2018 cardiac catheterization with moderate one-vessel nonobstructive coronary disease, normal LVEF, moderately elevated LVEDP in the setting of moderately elevated blood pressure.  She had cataract extraction 05/12/2019 and 06/11/2019.  Labs via care everywhere 07/07/2019  A1c 8.8  Total cholesterol 131, to glycerides 143, HDL 37.5, LDL 65  K4.5, creatinine 1.2, GFR 44, AST 26, ALT 30, alkaline phosphatase 115  Hemoglobin 13.6  Presents today for follow-up.  Shares with me that she has been having trouble with pain in her back and has been following with a chiropractor's office.  She is also previously had surgery on her back.  Does not feel the chiropractor is helping her much and we discussed possibly discussing PT with her primary care provider.  She reports no chest pain, pressure, tightness.  She reports no shortness of breath at rest.  Reports her dyspnea on exertion is stable at her baseline.  She attributes this to being overweight as she has difficulty doing exercise due to orthopedic difficulties.  She has intermittent lower extremity edema that is overall well controlled from her Lasix.  She does keep her legs elevated when sitting.  She denies orthopnea, PND.   EKGs/Labs/Other Studies Reviewed:   The following studies were reviewed today:  Cath 09/15/2018  The left ventricular systolic function is normal.  LV end diastolic pressure is moderately elevated.  The left ventricular ejection fraction is 55-65% by visual estimate.  Mid LAD lesion is 30% stenosed.  Lat 1st Diag lesion is 50% stenosed.  Prox LAD lesion is 40% stenosed. 1.  Moderate one-vessel nonobstructive coronary artery disease. 2.  Normal LV systolic function. 3.  Moderately elevated left ventricular end-diastolic pressure in the setting of moderately elevated systemic blood  pressure. Recommendations: Continue aggressive medical therapy. Recommend blood pressure control.  Continue furosemide.   EKG:  EKG is  ordered today.  The ekg ordered today demonstrates NSR 68 bpm with low voltage QRS - no acute ST/T wave changes.   Recent Labs: 09/11/2018: BUN 30; Creatinine, Ser 1.26; Hemoglobin 14.2; Platelets 272; Potassium 3.9; Sodium 139  Recent Lipid Panel    Component Value Date/Time   CHOL 125 10/07/2013 0812   TRIG 103.0 10/07/2013 0812   HDL 33.90 (L) 10/07/2013 0812   CHOLHDL 4 10/07/2013 0812   VLDL 20.6 10/07/2013 0812   LDLCALC 71 10/07/2013 0812    Home Medications   Current Meds  Medication Sig  . aspirin EC 81 MG tablet Take 1 tablet (81 mg total) by mouth daily.  . Cholecalciferol (VITAMIN D3) 125 MCG (5000 UT) TABS Take 5,000 Units by mouth daily.   . clotrimazole-betamethasone (LOTRISONE) cream Apply 1 application topically 2 (two) times daily as needed (rash).   . Cyanocobalamin (VITAMIN B12) 3000 MCG SUBL Take 3,000 mg by mouth daily.  Marland Kitchen diltiazem (CARDIZEM CD) 120 MG 24 hr capsule Take 120 mg by mouth daily.   . DULoxetine (CYMBALTA) 60  MG capsule Take 60 mg by mouth daily.  . furosemide (LASIX) 40 MG tablet TAKE 1 TABLET BY MOUTH ONCE A DAY (Patient taking differently: Take 40 mg by mouth daily. )  . Insulin Degludec (TRESIBA [place]) Inject 60 mg into the skin daily.  . insulin lispro (HUMALOG) 100 UNIT/ML injection Inject 24-36 Units into the skin 3 (three) times daily with meals. 24 units before breakfast and lunch and 28 units before supper - plus sliding scale (4 units per 50 over a target of 150)  . meloxicam (MOBIC) 7.5 MG tablet TAKE ONE CAPSULE BY MOUTH DAILY (Patient taking differently: Take 7.5 mg by mouth daily. )  . nitroGLYCERIN (NITROSTAT) 0.4 MG SL tablet Place 1 tablet (0.4 mg total) under the tongue every 5 (five) minutes as needed for chest pain.  Marland Kitchen OVER THE COUNTER MEDICATION Apply 1 application topically at bedtime as needed  (back pain). CBD cream  . pioglitazone (ACTOS) 45 MG tablet Take 22.5 mg by mouth daily.   . rosuvastatin (CRESTOR) 40 MG tablet TAKE 1 TABLET(40 MG) BY MOUTH DAILY  . Semaglutide (OZEMPIC, 0.25 OR 0.5 MG/DOSE, [place]) Inject 0.5 mg into the skin once a week.     Review of Systems    Review of Systems  Constitutional: Negative for chills, fever and malaise/fatigue.  Cardiovascular: Positive for dyspnea on exertion and leg swelling. Negative for chest pain, near-syncope, orthopnea, palpitations and syncope.  Respiratory: Negative for cough, shortness of breath and wheezing.   Gastrointestinal: Negative for nausea and vomiting.  Neurological: Negative for dizziness, light-headedness and weakness.   All other systems reviewed and are otherwise negative except as noted above.  Physical Exam    VS:  BP 132/60 (BP Location: Left Arm, Patient Position: Sitting, Cuff Size: Large)   Pulse 68   Ht 5\' 4"  (1.626 m)   Wt 230 lb (104.3 kg)   SpO2 98%   BMI 39.48 kg/m  , BMI Body mass index is 39.48 kg/m. GEN: Well nourished, overweight, well developed, in no acute distress. HEENT: normal. Neck: Supple, no JVD, carotid bruits, or masses. Cardiac: RRR, no murmurs, rubs, or gallops. No clubbing, cyanosis, edema.  Radials/DP/PT 2+ and equal bilaterally.  Respiratory:  Respirations regular and unlabored, clear to auscultation bilaterally. GI: Soft, nontender, nondistended, BS + x 4. MS: No deformity or atrophy. Skin: Warm and dry, no rash. Neuro:  Strength and sensation are intact. Psych: Normal affect.  Assessment & Plan    1. Chronic diastolic heart failure/pulmonary hypertension -euvolemic, compensated on exam.  Reports intermittent lower extremity edema overall well controlled on present dose of Lasix.  Her dyspnea on exertion is stable at baseline and likely multifactorial deconditioning, obesity, diastolic heart failure, pulmonary hypertension.  Continue Lasix 40 mg daily.  Continue low-sodium,  heart healthy diet.  2. CAD -EKG today with no acute ST/T wave changes.  No chest pain, pressure, tightness.  Her DOE is stable at baseline.  No indication for ischemic evaluation this time.  Continue present GDMT including aspirin, statin, as needed nitroglycerin.  We will continue diltiazem in place of beta-blocker.  3. HTN -BP well controlled. Continue current antihypertensive regimen.    4. HLD, LDL goal less than 70 -07/07/2019 LDL 65.  Continue Crestor 40 mg daily.  5. OSA -continued CPAP compliance encouraged.  6. DM2 - Follows with endocrinology.  Recent A1c 8.8.  Recently started with [facility] monitor and endorses much more than fingersticks.  Ideally would have her off of the Actos due  to her history of diastolic heart failure but will defer to her endocrinologist.  Disposition: Follow up in 6 month(s) with Dr. Kirke Corin or APP   Alver Sorrow, NP 08/18/2019, 5:08 PM

## 2020-01-12 ENCOUNTER — Other Ambulatory Visit: Payer: Self-pay | Admitting: Physician Assistant

## 2020-02-23 ENCOUNTER — Other Ambulatory Visit: Payer: Self-pay | Admitting: Physician Assistant

## 2020-02-23 DIAGNOSIS — Z1231 Encounter for screening mammogram for malignant neoplasm of breast: Secondary | ICD-10-CM

## 2020-03-01 ENCOUNTER — Ambulatory Visit: Payer: Medicare Other | Admitting: Cardiovascular Disease

## 2020-03-09 ENCOUNTER — Other Ambulatory Visit: Payer: Self-pay

## 2020-03-09 ENCOUNTER — Ambulatory Visit
Admission: RE | Admit: 2020-03-09 | Discharge: 2020-03-09 | Disposition: A | Discharge status: Home / Self Care | Payer: Medicare Other | Source: Ambulatory Visit | Attending: Physician Assistant | Admitting: Physician Assistant

## 2020-03-09 DIAGNOSIS — Z1231 Encounter for screening mammogram for malignant neoplasm of breast: Secondary | ICD-10-CM | POA: Diagnosis not present

## 2020-03-11 ENCOUNTER — Encounter: Payer: Self-pay | Admitting: Cardiovascular Disease

## 2020-03-11 ENCOUNTER — Ambulatory Visit (INDEPENDENT_AMBULATORY_CARE_PROVIDER_SITE_OTHER): Payer: Medicare Other | Admitting: Cardiovascular Disease

## 2020-03-11 ENCOUNTER — Other Ambulatory Visit: Payer: Self-pay

## 2020-03-11 VITALS — BP 136/60 | HR 78 | Ht 64.0 in | Wt 239.0 lb

## 2020-03-11 DIAGNOSIS — I10 Essential (primary) hypertension: Secondary | ICD-10-CM

## 2020-03-11 DIAGNOSIS — I25118 Atherosclerotic heart disease of native coronary artery with other forms of angina pectoris: Secondary | ICD-10-CM | POA: Diagnosis not present

## 2020-03-11 DIAGNOSIS — E785 Hyperlipidemia, unspecified: Secondary | ICD-10-CM | POA: Diagnosis not present

## 2020-03-11 DIAGNOSIS — I5032 Chronic diastolic (congestive) heart failure: Secondary | ICD-10-CM | POA: Diagnosis not present

## 2020-03-11 MED ORDER — IRBESARTAN 300 MG PO TABS: 300.0000 mg | ORAL_TABLET | Freq: Every day | ORAL | 2 refills | Status: DC

## 2020-03-11 MED ORDER — DILTIAZEM HCL ER COATED BEADS 120 MG PO CP24: 120.0000 mg | ORAL_CAPSULE | Freq: Every day | ORAL | 2 refills | Status: DC

## 2020-03-11 NOTE — Patient Instructions (Signed)

## 2020-03-11 NOTE — Progress Notes (Signed)
Cardiology Office Note   Date:  03/11/2020   ID:  Monica, Ray 03/19/1945, MRN [id number]  PCP:  Patrice Paradise, MD  Cardiologist:   Lorine Bears, MD   Chief Complaint  Patient presents with  . Other    6 month follow up. Meds reviewed verbally with patient.       History of Present Illness: Monica Ray is a 75 y.o. female who presents for a follow-up visit regarding chronic diastolic heart failure and nonobstructive coronary artery disease. She has multiple chronic medical conditions that include prolonged history of type 2 diabetes , essential hypertension, sleep apnea with poor intolerance to CPAP, hyperlipidemia and obesity. She is a lifelong nonsmoker. There is no family history of coronary artery disease but there is family history of cancer. She had previous back surgery. Previous  right and left cardiac catheterization in November 2017 showed normal LV systolic function with mildly elevated left ventricular end-diastolic pressure and mild pulmonary hypertension. There was mild nonobstructive coronary artery disease.   Pulmonary pressure was 37/17 with a mean of 25 mmHg. LVEDP was 18 mmHg.  She had recurrent chest pain in July 2020 that was evaluated by CTA of the coronary arteries.which showed moderate LAD disease which was significant by FFR.  Coronary calcium score was 120.  Due to that, a left heart catheterization was done in August 2020 which showed 40% proximal LAD disease and 50% ostial first diagonal disease.  Ejection fraction was normal with moderately elevated left ventricular end-diastolic pressure in the setting of moderately elevated blood pressure.  She has been doing very well and denies any chest pain, shortness of breath or palpitations.  No leg edema.  She takes her medications regularly.  Past Medical History:  Diagnosis Date  . Anemia    before hysterectomy  . Anginal pain (HCC)   . Depression   . Diabetes mellitus, type 2 (HCC)   . DM  retinopathy (HCC)   . Dyspnea   . Edema   . GERD (gastroesophageal reflux disease)   . Hx of phlebitis   . Hyperlipidemia   . Hypertension   . Mild allergic rhinitis   . MRSA (methicillin resistant staph aureus) culture positive 03/2006  . OA (osteoarthritis)    hands  . Sleep apnea    CPAP    Past Surgical History:  Procedure Laterality Date  . ABDOMINAL HYSTERECTOMY    . ADENOIDECTOMY    . APPENDECTOMY  1989  . barium enema  1998   neg CT  . CARDIAC CATHETERIZATION Bilateral 12/08/2015   Procedure: Right/Left Heart Cath and Coronary Angiography;  Surgeon: Iran Ouch, MD;  Location: [facility];  Service: Cardiovascular;  Laterality: Bilateral;  . CATARACT EXTRACTION W/PHACO Left 05/12/2019   Procedure: CATARACT EXTRACTION PHACO AND INTRAOCULAR LENS PLACEMENT (IOC) LEFT VISION BLUE;  Surgeon: Elliot Cousin, MD;  Location: [facility];  Service: Ophthalmology;  Laterality: Left;  12.80 1:17.1  . CATARACT EXTRACTION W/PHACO Right 06/11/2019   Procedure: CATARACT EXTRACTION PHACO AND INTRAOCULAR LENS PLACEMENT (IOC) RIGHT DIABETIC VISION BLUE 12.91  01:21.2;  Surgeon: Elliot Cousin, MD;  Location: [facility];  Service: Ophthalmology;  Laterality: Right;  Diabetic - insulin and oral meds  . CHOLECYSTECTOMY  1989  . LEFT HEART CATH AND CORONARY ANGIOGRAPHY Left 09/15/2018   Procedure: LEFT HEART CATH AND CORONARY ANGIOGRAPHY;  Surgeon: Iran Ouch, MD;  Location: [facility];  Service: Cardiovascular;  Laterality: Left;  .  LUMBAR LAMINECTOMY/DECOMPRESSION MICRODISCECTOMY N/A 11/25/2014   Procedure: LUMBAR LAMINECTOMY/DECOMPRESSION MICRODISCECTOMY LUMBAR TWO-THREE, LUMBAR THREE-FOUR ;  Surgeon: Tressie Stalker, MD;  Location: [facility];  Service: Neurosurgery;  Laterality: N/A;  . ROTATOR CUFF REPAIR  08/2001   right     Current Outpatient Medications  Medication Sig Dispense Refill  . aspirin EC 81 MG tablet Take 1 tablet (81 mg total)  by mouth daily.    . Cholecalciferol (VITAMIN D3) 125 MCG (5000 UT) TABS Take 5,000 Units by mouth daily.     . clotrimazole-betamethasone (LOTRISONE) cream Apply 1 application topically 2 (two) times daily as needed (rash).     . Cyanocobalamin (VITAMIN B12) 3000 MCG SUBL Take 3,000 mg by mouth daily.    Marland Kitchen diltiazem (CARDIZEM CD) 120 MG 24 hr capsule Take 120 mg by mouth daily.     . DULoxetine (CYMBALTA) 60 MG capsule Take 60 mg by mouth daily.    . furosemide (LASIX) 40 MG tablet TAKE 1 TABLET BY MOUTH ONCE A DAY 30 tablet 1  . Insulin Degludec (TRESIBA [place]) Inject 60 mg into the skin daily.    . insulin lispro (HUMALOG) 100 UNIT/ML injection Inject 24-36 Units into the skin 3 (three) times daily with meals. 24 units before breakfast and lunch and 28 units before supper - plus sliding scale (4 units per 50 over a target of 150)    . meloxicam (MOBIC) 7.5 MG tablet TAKE ONE CAPSULE BY MOUTH DAILY 30 tablet 0  . nitroGLYCERIN (NITROSTAT) 0.4 MG SL tablet Place 1 tablet (0.4 mg total) under the tongue every 5 (five) minutes as needed for chest pain. 25 tablet 6  . OVER THE COUNTER MEDICATION Apply 1 application topically at bedtime as needed (back pain). CBD cream    . pioglitazone (ACTOS) 45 MG tablet Take 22.5 mg by mouth daily.     . rosuvastatin (CRESTOR) 40 MG tablet TAKE 1 TABLET(40 MG) BY MOUTH DAILY 90 tablet 0  . Semaglutide (OZEMPIC, 0.25 OR 0.5 MG/DOSE, [place]) Inject 0.5 mg into the skin once a week.     . irbesartan (AVAPRO) 300 MG tablet Take 300 mg by mouth daily.      No current facility-administered medications for this visit.    Allergies:   Tegaderm ag mesh [silver], Amoxicillin, Metformin, Oxycodone, Ozempic (0.25 or 0.5 mg-dose) [semaglutide(0.25 or 0.5mg -dos)], Tape, Azithromycin, Clindamycin, Fluconazole, and Simvastatin    Social History:  The patient  reports that she has never smoked. She has never used smokeless tobacco. She reports that she does not drink alcohol and  does not use drugs.   Family History:  The patient's family history includes Alcohol abuse in her father and mother; Aneurysm in her father and paternal grandmother; Breast cancer in her paternal aunt; Cancer in her father and sister; Cancer (age of onset: 57) in her mother; Diabetes in her maternal grandmother and mother.    ROS:  Please see the history of present illness.   Otherwise, review of systems are positive for none.   All other systems are reviewed and negative.    PHYSICAL EXAM: VS:  BP 136/60 (BP Location: Left Arm, Patient Position: Sitting, Cuff Size: Large)   Pulse 78   Ht 5\' 4"  (1.626 m)   Wt 239 lb (108.4 kg)   BMI 41.02 kg/m  , BMI Body mass index is 41.02 kg/m. GEN: Well nourished, well developed, in no acute distress  HEENT: normal  Neck: no JVD, carotid bruits, or masses Cardiac:  RRR; no murmurs, rubs, or gallops,no edema  Respiratory:  clear to auscultation bilaterally, normal work of breathing GI: soft, nontender, nondistended, + BS MS: no deformity or atrophy  Skin: warm and dry, no rash Neuro:  Strength and sensation are intact Psych: euthymic mood, full affect   EKG:  EKG is ordered today. The ekg ordered today demonstrates normal sinus rhythm with no significant ST or T wave changes.   Recent Labs: No results found for requested labs within last 8760 hours.    Lipid Panel    Component Value Date/Time   CHOL 125 10/07/2013 0812   TRIG 103.0 10/07/2013 0812   HDL 33.90 (L) 10/07/2013 0812   CHOLHDL 4 10/07/2013 0812   VLDL 20.6 10/07/2013 0812   LDLCALC 71 10/07/2013 0812      Wt Readings from Last 3 Encounters:  03/11/20 239 lb (108.4 kg)  08/18/19 230 lb (104.3 kg)  06/11/19 228 lb (103.4 kg)       PAD Screen 11/22/2015  Previous PAD dx? No  Previous surgical procedure? No  Pain with walking? No  Feet/toe relief with dangling? No  Painful, non-healing ulcers? No  Extremities discolored? No      ASSESSMENT AND PLAN:  1.    Chronic diastolic heart failure: She appears to be euvolemic on current dose of furosemide 40 mg once daily.  I reviewed recent labs done with her primary care physician which showed stable creatinine of 1.2.  Potassium was 4.1.  2.  Coronary artery disease with other forms of angina: Cardiac catheterization 2020 showed moderate LAD/diagonal disease for which I recommend aggressive medical therapy.  Her chest pain is well controlled with medications.  2. Essential hypertension: Blood pressure is controlled on current medications.  3. Hyperlipidemia: Continue treatment with rosuvastatin 40 mg once daily.  I reviewed most recent lipid profile done in December which showed an LDL of 64 and triglyceride of 123.     Disposition:   FU with me in 6 months  Signed,  Lorine Bears, MD  03/11/2020 8:37 AM    [facility]

## 2020-03-16 ENCOUNTER — Other Ambulatory Visit: Payer: Self-pay | Admitting: Physician Assistant

## 2020-03-16 DIAGNOSIS — N6489 Other specified disorders of breast: Secondary | ICD-10-CM

## 2020-03-16 DIAGNOSIS — R928 Other abnormal and inconclusive findings on diagnostic imaging of breast: Secondary | ICD-10-CM

## 2020-03-21 ENCOUNTER — Ambulatory Visit
Admission: RE | Admit: 2020-03-21 | Discharge: 2020-03-21 | Disposition: A | Discharge status: Home / Self Care | Payer: Medicare Other | Source: Ambulatory Visit | Attending: Physician Assistant | Admitting: Physician Assistant

## 2020-03-21 ENCOUNTER — Other Ambulatory Visit: Payer: Self-pay

## 2020-03-21 DIAGNOSIS — N6489 Other specified disorders of breast: Secondary | ICD-10-CM | POA: Diagnosis present

## 2020-03-21 DIAGNOSIS — R928 Other abnormal and inconclusive findings on diagnostic imaging of breast: Secondary | ICD-10-CM

## 2020-07-25 ENCOUNTER — Other Ambulatory Visit: Payer: Self-pay | Admitting: Physician Assistant

## 2020-08-02 ENCOUNTER — Other Ambulatory Visit: Payer: Self-pay | Admitting: Physician Assistant

## 2020-09-13 ENCOUNTER — Ambulatory Visit: Payer: Medicare Other | Admitting: Cardiovascular Disease

## 2020-11-10 ENCOUNTER — Ambulatory Visit: Payer: Medicare Other | Admitting: Cardiovascular Disease

## 2020-11-15 ENCOUNTER — Ambulatory Visit (INDEPENDENT_AMBULATORY_CARE_PROVIDER_SITE_OTHER): Payer: Medicare Other | Admitting: Cardiovascular Disease

## 2020-11-15 ENCOUNTER — Encounter: Payer: Self-pay | Admitting: Cardiovascular Disease

## 2020-11-15 ENCOUNTER — Other Ambulatory Visit: Payer: Self-pay

## 2020-11-15 VITALS — BP 148/60 | HR 65 | Ht 64.0 in | Wt 255.2 lb

## 2020-11-15 DIAGNOSIS — I10 Essential (primary) hypertension: Secondary | ICD-10-CM

## 2020-11-15 DIAGNOSIS — I25118 Atherosclerotic heart disease of native coronary artery with other forms of angina pectoris: Secondary | ICD-10-CM

## 2020-11-15 DIAGNOSIS — E785 Hyperlipidemia, unspecified: Secondary | ICD-10-CM

## 2020-11-15 DIAGNOSIS — I5032 Chronic diastolic (congestive) heart failure: Secondary | ICD-10-CM | POA: Diagnosis not present

## 2020-11-15 MED ORDER — FUROSEMIDE 20 MG PO TABS: 20.0000 mg | ORAL_TABLET | Freq: Every day | ORAL | 3 refills | Status: DC

## 2020-11-15 NOTE — Progress Notes (Signed)
Cardiology Office Note   Date:  11/15/2020   ID:  Monica, Ray [birth year], MRN [id number]  PCP:  Patrice Paradise, MD  Cardiologist:   Lorine Bears, MD   Chief Complaint  Patient presents with   Other    6 month f/u c/o sob and edema ankles/legs. Meds reviewed verbally with pt.       History of Present Illness: Monica Ray is a 75 y.o. female who presents for a follow-up visit regarding chronic diastolic heart failure and nonobstructive coronary artery disease. She has multiple chronic medical conditions that include prolonged history of type 2 diabetes , essential hypertension, sleep apnea with poor intolerance to CPAP, hyperlipidemia and obesity. She is a lifelong nonsmoker. There is no family history of coronary artery disease but there is family history of cancer. She had previous back surgery. Previous  right and left cardiac catheterization in November 2017 showed normal LV systolic function with mildly elevated left ventricular end-diastolic pressure and mild pulmonary hypertension. There was mild nonobstructive coronary artery disease.   Pulmonary pressure was 37/17 with a mean of 25 mmHg. LVEDP was 18 mmHg.  She had recurrent chest pain in July 2020 that was evaluated by CTA of the coronary arteries.which showed moderate LAD disease which was significant by FFR.  Coronary calcium score was 120.  Due to that, a left heart catheterization was done in August 2020 which showed 40% proximal LAD disease and 50% ostial first diagonal disease.  Ejection fraction was normal with moderately elevated left ventricular end-diastolic pressure in the setting of moderately elevated blood pressure.  She has been doing well with no recent chest pain or worsening dyspnea.  No significant leg edema.  She continues to struggle with low back pain radiating to both legs.  Past Medical History:  Diagnosis Date   Anemia    before hysterectomy   Anginal pain (HCC)    Depression     Diabetes mellitus, type 2 (HCC)    DM retinopathy (HCC)    Dyspnea    Edema    GERD (gastroesophageal reflux disease)    Hx of phlebitis    Hyperlipidemia    Hypertension    Mild allergic rhinitis    MRSA (methicillin resistant staph aureus) culture positive 03/2006   OA (osteoarthritis)    hands   Sleep apnea    CPAP    Past Surgical History:  Procedure Laterality Date   ABDOMINAL HYSTERECTOMY     ADENOIDECTOMY     APPENDECTOMY  1989   barium enema  1998   neg CT   CARDIAC CATHETERIZATION Bilateral 12/08/2015   Procedure: Right/Left Heart Cath and Coronary Angiography;  Surgeon: Iran Ouch, MD;  Location: [facility];  Service: Cardiovascular;  Laterality: Bilateral;   CATARACT EXTRACTION W/PHACO Left 05/12/2019   Procedure: CATARACT EXTRACTION PHACO AND INTRAOCULAR LENS PLACEMENT (IOC) LEFT VISION BLUE;  Surgeon: Elliot Cousin, MD;  Location: [facility];  Service: Ophthalmology;  Laterality: Left;  12.80 1:17.1   CATARACT EXTRACTION W/PHACO Right 06/11/2019   Procedure: CATARACT EXTRACTION PHACO AND INTRAOCULAR LENS PLACEMENT (IOC) RIGHT DIABETIC VISION BLUE 12.91  01:21.2;  Surgeon: Elliot Cousin, MD;  Location: [facility];  Service: Ophthalmology;  Laterality: Right;  Diabetic - insulin and oral meds   CHOLECYSTECTOMY  1989   LEFT HEART CATH AND CORONARY ANGIOGRAPHY Left 09/15/2018   Procedure: LEFT HEART CATH AND CORONARY ANGIOGRAPHY;  Surgeon: Iran Ouch, MD;  Location: [facility];  Service: Cardiovascular;  Laterality: Left;   LUMBAR LAMINECTOMY/DECOMPRESSION MICRODISCECTOMY N/A 11/25/2014   Procedure: LUMBAR LAMINECTOMY/DECOMPRESSION MICRODISCECTOMY LUMBAR TWO-THREE, LUMBAR THREE-FOUR ;  Surgeon: Tressie Stalker, MD;  Location: [facility];  Service: Neurosurgery;  Laterality: N/A;   ROTATOR CUFF REPAIR  08/2001   right     Current Outpatient Medications  Medication Sig Dispense Refill   aspirin EC 81 MG tablet Take 1  tablet (81 mg total) by mouth daily.     Cholecalciferol (VITAMIN D3) 125 MCG (5000 UT) TABS Take 5,000 Units by mouth daily.      clotrimazole-betamethasone (LOTRISONE) cream Apply 1 application topically 2 (two) times daily as needed (rash).      Cyanocobalamin (VITAMIN B12) 3000 MCG SUBL Take 3,000 mg by mouth daily.     diltiazem (CARDIZEM CD) 120 MG 24 hr capsule Take 1 capsule (120 mg total) by mouth daily. 90 capsule 2   DULoxetine (CYMBALTA) 60 MG capsule Take 60 mg by mouth daily.     furosemide (LASIX) 40 MG tablet TAKE 1 TABLET BY MOUTH ONCE A DAY 30 tablet 1   Insulin Degludec (TRESIBA [place]) Inject 60 mg into the skin daily.     insulin lispro (HUMALOG) 100 UNIT/ML injection Inject 24-36 Units into the skin 3 (three) times daily with meals. 24 units before breakfast and lunch and 28 units before supper - plus sliding scale (4 units per 50 over a target of 150)     irbesartan (AVAPRO) 300 MG tablet Take 1 tablet (300 mg total) by mouth daily. 90 tablet 2   meloxicam (MOBIC) 7.5 MG tablet TAKE ONE CAPSULE BY MOUTH DAILY 30 tablet 0   nitroGLYCERIN (NITROSTAT) 0.4 MG SL tablet Place 1 tablet (0.4 mg total) under the tongue every 5 (five) minutes as needed for chest pain. 25 tablet 6   OVER THE COUNTER MEDICATION Apply 1 application topically at bedtime as needed (back pain). CBD cream     pioglitazone (ACTOS) 45 MG tablet Take 22.5 mg by mouth daily.      rosuvastatin (CRESTOR) 40 MG tablet TAKE 1 TABLET(40 MG) BY MOUTH DAILY 90 tablet 0   TRULICITY 1.5 MG/0.5ML SOPN Inject 1.5 mg into the skin once a week.     No current facility-administered medications for this visit.    Allergies:   Tegaderm ag mesh [silver], Amoxicillin, Metformin, Oxycodone, Ozempic (0.25 or 0.5 mg-dose) [semaglutide(0.25 or 0.5mg -dos)], Tape, Azithromycin, Clindamycin, Fluconazole, and Simvastatin    Social History:  The patient  reports that she has never smoked. She has never used smokeless tobacco. She reports  that she does not drink alcohol and does not use drugs.   Family History:  The patient's family history includes Alcohol abuse in her father and mother; Aneurysm in her father and paternal grandmother; Breast cancer in her paternal aunt; Cancer in her father and sister; Cancer (age of onset: 67) in her mother; Diabetes in her maternal grandmother and mother.    ROS:  Please see the history of present illness.   Otherwise, review of systems are positive for none.   All other systems are reviewed and negative.    PHYSICAL EXAM: VS:  BP (!) 148/60 (BP Location: Left Arm, Patient Position: Sitting, Cuff Size: Large)   Pulse 65   Ht 5\' 4"  (1.626 m)   Wt 255 lb 4 oz (115.8 kg)   SpO2 98%   BMI 43.81 kg/m  , BMI Body mass index is 43.81 kg/m. GEN: Well  nourished, well developed, in no acute distress  HEENT: normal  Neck: no JVD, carotid bruits, or masses Cardiac: RRR; no murmurs, rubs, or gallops,no edema  Respiratory:  clear to auscultation bilaterally, normal work of breathing GI: soft, nontender, nondistended, + BS MS: no deformity or atrophy  Skin: warm and dry, no rash Neuro:  Strength and sensation are intact Psych: euthymic mood, full affect   EKG:  EKG is ordered today. The ekg ordered today demonstrates normal sinus rhythm with no significant ST or T wave changes.   Recent Labs: No results found for requested labs within last 8760 hours.    Lipid Panel    Component Value Date/Time   CHOL 125 10/07/2013 0812   TRIG 103.0 10/07/2013 0812   HDL 33.90 (L) 10/07/2013 0812   CHOLHDL 4 10/07/2013 0812   VLDL 20.6 10/07/2013 0812   LDLCALC 71 10/07/2013 0812      Wt Readings from Last 3 Encounters:  11/15/20 255 lb 4 oz (115.8 kg)  03/11/20 239 lb (108.4 kg)  08/18/19 230 lb (104.3 kg)       PAD Screen 11/22/2015  Previous PAD dx? No  Previous surgical procedure? No  Pain with walking? No  Feet/toe relief with dangling? No  Painful, non-healing ulcers? No   Extremities discolored? No      ASSESSMENT AND PLAN:  1.   Chronic diastolic heart failure: She appears to be euvolemic.  She is currently on torsemide 40 mg once daily.  Most recent lab showed stable creatinine of 1.2 but BUN was mildly elevated.  I elected to decrease furosemide to 20 mg daily.    2.  Coronary artery disease with other forms of angina: Cardiac catheterization 2020 showed moderate LAD/diagonal disease for which I recommend aggressive medical therapy.  No reported chest pain at the present time.  2. Essential hypertension: Blood pressure is controlled on current medications.   3. Hyperlipidemia: I reviewed most recent lipid profile done in August which showed an LDL of 62 and triglyceride of 112.  Based on this, I recommend continuing current dose of rosuvastatin 40 mg daily.     Disposition:   FU with me in 12 months  Signed,  Lorine Bears, MD  11/15/2020 2:34 PM    [facility]

## 2020-11-15 NOTE — Patient Instructions (Signed)
Medication Instructions:  Your physician has recommended you make the following change in your medication:   REDUCE Lasix to 20 mg daily  *If you need a refill on your cardiac medications before your next appointment, please call your pharmacy*   Lab Work: None ordered  If you have labs (blood work) drawn today and your tests are completely normal, you will receive your results only by: MyChart Message (if you have MyChart) OR A paper copy in the mail If you have any lab test that is abnormal or we need to change your treatment, we will call you to review the results.   Testing/Procedures: None ordered   Follow-Up: At [facility], you and your health needs are our priority.  As part of our continuing mission to provide you with exceptional heart care, we have created designated Provider Care Teams.  These Care Teams include your primary Cardiologist (physician) and Advanced Practice Providers (APPs -  Physician Assistants and Nurse Practitioners) who all work together to provide you with the care you need, when you need it.  We recommend signing up for the patient portal called "MyChart".  Sign up information is provided on this After Visit Summary.  MyChart is used to connect with patients for Virtual Visits (Telemedicine).  Patients are able to view lab/test results, encounter notes, upcoming appointments, etc.  Non-urgent messages can be sent to your provider as well.   To learn more about what you can do with MyChart, go to [email or web address].    Your next appointment:   Your physician wants you to follow-up in: 1 year You will receive a reminder letter in the mail two months in advance. If you don't receive a letter, please call our office to schedule the follow-up appointment.   The format for your next appointment:   In Person  Provider:   You may see Lorine Bears, MD or one of the following Advanced Practice Providers on your designated Care Team:   Nicolasa Ducking, NP Eula Listen, PA-C Marisue Ivan, PA-C Cadence Fransico Michael, [place]   Other Instructions N/A

## 2020-11-18 NOTE — Addendum Note (Signed)
Addended by: Theola Sequin on: 11/18/2020 04:53 PM   Modules accepted: Orders

## 2021-02-14 ENCOUNTER — Other Ambulatory Visit: Payer: Self-pay | Admitting: Physician Assistant

## 2021-02-14 DIAGNOSIS — Z1231 Encounter for screening mammogram for malignant neoplasm of breast: Secondary | ICD-10-CM

## 2021-04-06 ENCOUNTER — Ambulatory Visit
Admission: RE | Admit: 2021-04-06 | Discharge: 2021-04-06 | Disposition: A | Discharge status: Home / Self Care | Payer: Medicare Other | Source: Ambulatory Visit | Attending: Physician Assistant | Admitting: Physician Assistant

## 2021-04-06 ENCOUNTER — Other Ambulatory Visit: Payer: Self-pay

## 2021-04-06 DIAGNOSIS — Z1231 Encounter for screening mammogram for malignant neoplasm of breast: Secondary | ICD-10-CM | POA: Diagnosis present

## 2021-08-14 ENCOUNTER — Telehealth: Payer: Self-pay | Admitting: Cardiovascular Disease

## 2021-08-14 NOTE — Telephone Encounter (Signed)
Spoke with pt. Pt c/o incr dyspnea on exertion and requests sooner appt.  Currently scheduled 10/04/21 with Ward Givens, NP.  Pt denies shortness of breath at this time. States only with exertion but this was worsened recently.  Pt denies chest pain. Denies dizziness at this time.  Pt does not check BP at home. No current home BP readings.  When asked about dizziness, pt states she is not really concerned with dizziness, she mostly wants to discuss incr DOE.  Rescheduled appt sooner per pt request d/t incr symptoms.  R/s to 08/24/21 at 8:25 am with Ward Givens, NP.  ER precautions provided.   Pt voiced understanding. No further needs/questions at this time.

## 2021-08-14 NOTE — Telephone Encounter (Signed)
Pt c/o Shortness Of Breath: STAT if SOB developed within the last 24 hours or pt is noticeably SOB on the phone  1. Are you currently SOB (can you hear that pt is SOB on the phone)? No  2. How long have you been experiencing SOB? months  3. Are you SOB when sitting or when up moving around? Walking  4. Are you currently experiencing any other symptoms? Dizziness

## 2021-08-24 ENCOUNTER — Encounter: Payer: Self-pay | Admitting: Nurse Practitioner

## 2021-08-24 ENCOUNTER — Ambulatory Visit (INDEPENDENT_AMBULATORY_CARE_PROVIDER_SITE_OTHER): Payer: Medicare Other | Admitting: Nurse Practitioner

## 2021-08-24 VITALS — BP 124/54 | HR 60 | Ht 64.0 in | Wt 251.6 lb

## 2021-08-24 DIAGNOSIS — R0602 Shortness of breath: Secondary | ICD-10-CM | POA: Diagnosis not present

## 2021-08-24 DIAGNOSIS — I2510 Atherosclerotic heart disease of native coronary artery without angina pectoris: Secondary | ICD-10-CM | POA: Diagnosis not present

## 2021-08-24 DIAGNOSIS — G4733 Obstructive sleep apnea (adult) (pediatric): Secondary | ICD-10-CM

## 2021-08-24 DIAGNOSIS — I5032 Chronic diastolic (congestive) heart failure: Secondary | ICD-10-CM

## 2021-08-24 DIAGNOSIS — I10 Essential (primary) hypertension: Secondary | ICD-10-CM

## 2021-08-24 DIAGNOSIS — Z794 Long term (current) use of insulin: Secondary | ICD-10-CM

## 2021-08-24 DIAGNOSIS — E119 Type 2 diabetes mellitus without complications: Secondary | ICD-10-CM

## 2021-08-24 DIAGNOSIS — E785 Hyperlipidemia, unspecified: Secondary | ICD-10-CM

## 2021-08-24 NOTE — Progress Notes (Signed)
Office Visit    Patient Name: BRANDALYNN OFALLON Date of Encounter: 08/24/2021  Primary Care Provider:  Marinda Elk, MD Primary Cardiologist:  Kathlyn Sacramento, MD  Chief Complaint    76 y/o ? w/ a history of chest pain and nonobstructive CAD, chronic HFpEF, hypertension, hyperlipidemia, diabetes, obesity, and sleep apnea, who presents for follow-up related to dyspnea on exertion.  Past Medical History    Past Medical History:  Diagnosis Date   Anemia    before hysterectomy   Anginal pain ([place])    Depression    Diabetes mellitus, type 2 ([place])    DM retinopathy ([place])    Dyspnea    Edema    GERD (gastroesophageal reflux disease)    Hx of phlebitis    Hyperlipidemia    Hypertension    Mild allergic rhinitis    MRSA (methicillin resistant staph aureus) culture positive 03/2006   Nonobstructive CAD (coronary artery disease)    a. 12/2015 Cath: LAD min irregs, otw nl cors; b. 08/2018 Cor CTA: Mid/Distal LAD and OM1 dzs w/ abnl ctFFR; c. 09/2018 Cath: LM nl, LAD 40p, 63m D1 50, LCX small-nl, RCA nl, EF 55-65%.   OA (osteoarthritis)    hands   Sleep apnea    CPAP   Past Surgical History:  Procedure Laterality Date   ABDOMINAL HYSTERECTOMY     ADENOIDECTOMY     APPENDECTOMY  1989   barium enema  1998   neg CT   CARDIAC CATHETERIZATION Bilateral 12/08/2015   Procedure: Right/Left Heart Cath and Coronary Angiography;  Surgeon: MWellington Hampshire MD;  [facility];  Service: Cardiovascular;  Laterality: Bilateral;   CATARACT EXTRACTION W/PHACO Left 05/12/2019   Procedure: CATARACT EXTRACTION PHACO AND INTRAOCULAR LENS PLACEMENT ([place] VISION BLUE;  Surgeon: HMarchia Meiers MD;  [facility]  Service: Ophthalmology;  Laterality: Left;  12.80 1:17.1   CATARACT EXTRACTION W/PHACO Right 06/11/2019   Procedure: CATARACT EXTRACTION PHACO AND INTRAOCULAR LENS PLACEMENT (I[facility]) RIGHT DIABETIC VISION BLUE 12.91  01:21.2;  Surgeon: HMarchia Meiers MD;   [facility]  Service: Ophthalmology;  Laterality: Right;  Diabetic - insulin and oral meds   CHOLECYSTECTOMY  1989   LEFT HEART CATH AND CORONARY ANGIOGRAPHY Left 09/15/2018   Procedure: LEFT HEART CATH AND CORONARY ANGIOGRAPHY;  Surgeon: AWellington Hampshire MD;  [facility];  Service: Cardiovascular;  Laterality: Left;   LUMBAR LAMINECTOMY/DECOMPRESSION MICRODISCECTOMY N/A 11/25/2014   Procedure: LUMBAR LAMINECTOMY/DECOMPRESSION MICRODISCECTOMY LUMBAR TWO-THREE, LUMBAR THREE-FOUR ;  Surgeon: JNewman Pies MD;  [facility];  Service: Neurosurgery;  Laterality: N/A;   ROTATOR CUFF REPAIR  08/2001   right    Allergies  Allergies  Allergen Reactions   Tegaderm Ag Mesh [Silver] Rash   Amoxicillin Hives and Rash    Did it involve swelling of the face/tongue/throat, SOB, or low BP? No Did it involve sudden or severe rash/hives, skin peeling, or any reaction on the inside of your mouth or nose? No Did you need to seek medical attention at a hospital or doctor's office? No When did it last happen?  ~2015  If all above answers are "NO", may proceed with cephalosporin use.    Metformin Diarrhea and Nausea And Vomiting   Oxycodone Itching   Ozempic (0.25 Or 0.5 Mg-Dose) [Semaglutide(0.25 Or 0.555mDos)]    Tape Other (See Comments)    Caused burning sensation   Azithromycin Rash and Itching   Clindamycin Hives  and Rash   Fluconazole Itching   Simvastatin Other (See Comments)    Back pain    History of Present Illness    76 y/o ? w/ a history of chest pain and nonobstructive CAD, chronic HFpEF, hypertension, hyperlipidemia, diabetes, obesity, and sleep apnea.  In the setting of chest pain, she underwent diagnostic catheterization 2017, which showed minor irregularities in the LAD and otherwise normal coronary arteries.  She had recurrent chest pain in July 2020 and was evaluated with coronary CT angiogram which suggested significant LAD and  OM1 disease based on CT FFR.  Coronary calcium score was 120.  This was followed by diagnostic catheterization which showed mild to moderate nonobstructive LAD and diagonal disease with normal LV function, and she was medically managed.  Ms. Rae was last seen in cardiology clinic in October 2022 at which time she was chest pain-free and euvolemic on examination.  Furosemide dose was reduced to 20 mg daily in the setting of mild elevation of BUN/creatinine at 30 and 1.26 respectively.  Over the past year, Ms. Morrisette has noted significant decline in activity tolerance and progressive dyspnea on exertion.  She thinks she can only walk about 30 feet before having to stop and rest.  She does not experience chest pain.  She occasionally notes lower extremity swelling, typically at the end of the day.  She also has daytime fatigue and sleepiness, noting that she often dozes off while sitting at work.  She has a history of sleep apnea and wears CPAP but notes that her machine does not seem to be working correctly as it is not blowing air like it used to.  She is sedentary and does not routinely exercise.  She has chronic hip, leg, and lower back pain, which limits activity.  She denies palpitations, PND, orthopnea, dizziness, syncope, or early satiety.  Home Medications    Current Outpatient Medications  Medication Sig Dispense Refill   aspirin EC 81 MG tablet Take 1 tablet (81 mg total) by mouth daily.     Cholecalciferol (VITAMIN D3) 125 MCG (5000 UT) TABS Take 5,000 Units by mouth daily.      clotrimazole-betamethasone (LOTRISONE) cream Apply 1 application topically 2 (two) times daily as needed (rash).      Cyanocobalamin (VITAMIN B12) 3000 MCG SUBL Take 3,000 mg by mouth daily.     diltiazem (CARDIZEM CD) 120 MG 24 hr capsule Take 1 capsule (120 mg total) by mouth daily. 90 capsule 2   DULoxetine (CYMBALTA) 60 MG capsule Take 60 mg by mouth daily.     furosemide (LASIX) 20 MG tablet Take 1 tablet (20  mg total) by mouth daily. 90 tablet 3   Insulin Degludec (TRESIBA [place]) Inject 60 mg into the skin daily.     insulin lispro (HUMALOG) 100 UNIT/ML injection Inject 24-36 Units into the skin 3 (three) times daily with meals. 24 units before breakfast and lunch and 28 units before supper - plus sliding scale (4 units per 50 over a target of 150)     irbesartan (AVAPRO) 300 MG tablet Take 1 tablet (300 mg total) by mouth daily. 90 tablet 2   meloxicam (MOBIC) 7.5 MG tablet TAKE ONE CAPSULE BY MOUTH DAILY 30 tablet 0   pioglitazone (ACTOS) 45 MG tablet Take 22.5 mg by mouth daily.      rosuvastatin (CRESTOR) 40 MG tablet TAKE 1 TABLET(40 MG) BY MOUTH DAILY 90 tablet 0   TRULICITY 1.5 IE/3.3IR SOPN Inject 1.5 mg into the  skin once a week.     nitroGLYCERIN (NITROSTAT) 0.4 MG SL tablet Place 1 tablet (0.4 mg total) under the tongue every 5 (five) minutes as needed for chest pain. 25 tablet 6   No current facility-administered medications for this visit.     Review of Systems    As above, significant progressive dyspnea on exertion.  Occasional lower extremity swelling.  She has chronic hip and leg pain.  Daytime fatigue and somnolence.  She denies chest pain, palpitations, PND, orthopnea, dizziness, syncope, or early satiety.  All other systems reviewed and are otherwise negative except as noted above.    Physical Exam    VS:  BP 140/70   Pulse 60   Ht _0  (1.626 m)   Wt 251 lb 9.6 oz (114.1 kg)   SpO2 98%   BMI 43.19 kg/m  , BMI Body mass index is 43.19 kg/m.     Vitals:   08/24/21 0816 08/24/21 0911  BP: 140/70 (!) 124/54  Pulse: 60   SpO2: 98%     GEN: Morbidly obese, in no acute distress. HEENT: normal. Neck: Supple, obese, difficult to gauge JVP.  No bruits or masses.   Cardiac: RRR, 1/6 systolic murmur at the upper sternal borders, no rubs or gallops.  No clubbing, cyanosis, edema.  Radials/PT 2+ and equal bilaterally.  Respiratory:  Respirations regular and unlabored, clear to  auscultation bilaterally. GI: Obese, soft, nontender, nondistended, BS + x 4. MS: no deformity or atrophy. Skin: warm and dry, no rash. Neuro:  Strength and sensation are intact. Psych: Normal affect.  Accessory Clinical Findings    ECG personally reviewed by me today -regular sinus rhythm, 60, nonspecific ST changes- no acute changes.  Labs from care everywhere dated August 22, 2021  Total cholesterol 112, triglycerides 111, HDL 33.6, LDL 56 ESR 57 CRP less than 1 Total protein 6.6, albumin 3.6, total bilirubin 0.5 alkaline phosphatase 86, AST 16, ALT 15  Labs from care everywhere dated April 26, 2021  Hemoglobin A1c 7.5 Sodium 141, potassium 4.5, chloride 107, CO2 26.4, BUN 24, creatinine 1.1, glucose 146  Labs from care everywhere dated February 09, 2021  Hemoglobin 12.5, hematocrit 38.6, WBC 5.1, platelets 245 _____________   Assessment & Plan    1.  Chronic HFpEF/dyspnea on exertion: Patient with a history of dyspnea and intermittent chest pain though over the past year, she has noted progressive dyspnea and reduction in activity tolerance without chest pain.  She can only walk about 30 feet prior to having to stop and rest.  She occasionally notes lower extremity swelling though this is typically at the end of the day.  The body habitus makes exam challenging, she does appear to be euvolemic today, and her weight is stable compared to last year.  Blood pressure was initially elevated but on repeat, she was 124/54.  She does not routinely check this at home.  I will obtain a 2D echocardiogram.  She has daytime somnolence and fatigue and notes that she does not think her CPAP has been working correctly.  I have asked her to follow-up with her primary care provider, who prescribes her CPAP, as she will likely need her machine looked at, and repeat in lab study for adjustments.  If echo shows normal LV function without significant valve disease, we will likely plan on noninvasive ischemic  evaluation such as a 2-day Lexiscan Myoview versus [place] PET CT.  If EF abnormal, will need cath.  2.  CAD: Moderate  nonobstructive CAD on catheterization 2020.  She has not been having chest pain.  As she is at risk for progression of coronary artery disease, as above, pending echo, will consider most appropriate modality of ischemic testing.  Continue aspirin and statin therapy.  Essential hypertension: Initially she was hypertensive at 140/70.  On repeat, she is 124/54.  She remains on diltiazem therapy.  3.  Hyperlipidemia: LDL of 56 earlier this week.  Continue rosuvastatin therapy.  4.  Type 2 diabetes mellitus: A1c 7.5 in March.  Management primary care with multiple medications.  5.  Obstructive sleep apnea: Compliant with CPAP but reports that she doesn't think her cpap is working appropriately and also has significant daytime fatigue and somnolence.  She will follow-up with her primary care provider and likely needs repeat sleep study.  6.  Morbid obesity/Deconditioning:  Likely contributing to symptoms.  She's recently started PT.  Pending further cardiac testing, can hopefully recommend exercise program.  7.  Disposition:  F/u echo w/ additional ischemic testing pending results.  F/u in clinic in 4 wks.   Murray Hodgkins, NP 08/24/2021, 9:05 AM

## 2021-08-24 NOTE — Patient Instructions (Signed)
Medication Instructions:  - Your physician recommends that you continue on your current medications as directed. Please refer to the Current Medication list given to you today.  *If you need a refill on your cardiac medications before your next appointment, please call your pharmacy*   Lab Work: - none ordered  If you have labs (blood work) drawn today and your tests are completely normal, you will receive your results only by: MyChart Message (if you have MyChart) OR A paper copy in the mail If you have any lab test that is abnormal or we need to change your treatment, we will call you to review the results.   Testing/Procedures:  1) Echocardiogram: - Your physician has requested that you have an echocardiogram. Echocardiography is a painless test that uses sound waves to create images of your heart. It provides your doctor with information about the size and shape of your heart and how well your heart's chambers and valves are working. This procedure takes approximately one hour. There are no restrictions for this procedure. There is a possibility that an IV may need to be started during your test to inject an image enhancing agent. This is done to obtain more optimal pictures of your heart. Therefore we ask that you do at least drink some water prior to coming in to hydrate your veins.     Follow-Up: At [facility], you and your health needs are our priority.  As part of our continuing mission to provide you with exceptional heart care, we have created designated Provider Care Teams.  These Care Teams include your primary Cardiologist (physician) and Advanced Practice Providers (APPs -  Physician Assistants and Nurse Practitioners) who all work together to provide you with the care you need, when you need it.  We recommend signing up for the patient portal called "MyChart".  Sign up information is provided on this After Visit Summary.  MyChart is used to connect with patients for  Virtual Visits (Telemedicine).  Patients are able to view lab/test results, encounter notes, upcoming appointments, etc.  Non-urgent messages can be sent to your provider as well.   To learn more about what you can do with MyChart, go to [email or web address].    Your next appointment:   1 month(s)  The format for your next appointment:   In Person  Provider:   You may see Lorine Bears, MD or one of the following Advanced Practice Providers on your designated Care Team:    Eula Listen, PA-C      Other Instructions  Echocardiogram An echocardiogram is a test that uses sound waves (ultrasound) to produce images of the heart. Images from an echocardiogram can provide important information about: Heart size and shape. The size and thickness and movement of your heart's walls. Heart muscle function and strength. Heart valve function or if you have stenosis. Stenosis is when the heart valves are too narrow. If blood is flowing backward through the heart valves (regurgitation). A tumor or infectious growth around the heart valves. Areas of heart muscle that are not working well because of poor blood flow or injury from a heart attack. Aneurysm detection. An aneurysm is a weak or damaged part of an artery wall. The wall bulges out from the normal force of blood pumping through the body. Tell a health care provider about: Any allergies you have. All medicines you are taking, including vitamins, herbs, eye drops, creams, and over-the-counter medicines. Any blood disorders you have. Any surgeries you have had. Any  medical conditions you have. Whether you are pregnant or may be pregnant. What are the risks? Generally, this is a safe test. However, problems may occur, including an allergic reaction to dye (contrast) that may be used during the test. What happens before the test? No specific preparation is needed. You may eat and drink normally. What happens during the test?  You will  take off your clothes from the waist up and put on a hospital gown. Electrodes or electrocardiogram (ECG)patches may be placed on your chest. The electrodes or patches are then connected to a device that monitors your heart rate and rhythm. You will lie down on a table for an ultrasound exam. A gel will be applied to your chest to help sound waves pass through your skin. A handheld device, called a transducer, will be pressed against your chest and moved over your heart. The transducer produces sound waves that travel to your heart and bounce back (or "echo" back) to the transducer. These sound waves will be captured in real-time and changed into images of your heart that can be viewed on a video monitor. The images will be recorded on a computer and reviewed by your health care provider. You may be asked to change positions or hold your breath for a short time. This makes it easier to get different views or better views of your heart. In some cases, you may receive contrast through an IV in one of your veins. This can improve the quality of the pictures from your heart. The procedure may vary among health care providers and hospitals. What can I expect after the test? You may return to your normal, everyday life, including diet, activities, and medicines, unless your health care provider tells you not to do that. Follow these instructions at home: It is up to you to get the results of your test. Ask your health care provider, or the department that is doing the test, when your results will be ready. Keep all follow-up visits. This is important. Summary An echocardiogram is a test that uses sound waves (ultrasound) to produce images of the heart. Images from an echocardiogram can provide important information about the size and shape of your heart, heart muscle function, heart valve function, and other possible heart problems. You do not need to do anything to prepare before this test. You may eat and  drink normally. After the echocardiogram is completed, you may return to your normal, everyday life, unless your health care provider tells you not to do that. This information is not intended to replace advice given to you by your health care provider. Make sure you discuss any questions you have with your health care provider. Document Revised: 10/05/2020 Document Reviewed: 09/15/2019 Elsevier Patient Education  2023 Elsevier Inc.   Important Information About Sugar

## 2021-09-21 ENCOUNTER — Ambulatory Visit (INDEPENDENT_AMBULATORY_CARE_PROVIDER_SITE_OTHER): Payer: Medicare Other

## 2021-09-21 DIAGNOSIS — R0602 Shortness of breath: Secondary | ICD-10-CM | POA: Diagnosis not present

## 2021-09-21 LAB — ECHOCARDIOGRAM COMPLETE
AR max vel: 2.38 cm2
AV Area VTI: 2.47 cm2
AV Area mean vel: 2.43 cm2
AV Mean grad: 7.5 mmHg
AV Peak grad: 14.9 mmHg
Ao pk vel: 1.93 m/s
Area-P 1/2: 3.6 cm2
S' Lateral: 3.8 cm
Single Plane A4C EF: 59.3 %

## 2021-09-22 ENCOUNTER — Telehealth: Payer: Self-pay | Admitting: *Deleted

## 2021-09-22 DIAGNOSIS — I5032 Chronic diastolic (congestive) heart failure: Secondary | ICD-10-CM

## 2021-09-22 DIAGNOSIS — R0602 Shortness of breath: Secondary | ICD-10-CM

## 2021-09-22 DIAGNOSIS — I2510 Atherosclerotic heart disease of native coronary artery without angina pectoris: Secondary | ICD-10-CM

## 2021-09-22 MED ORDER — FUROSEMIDE 20 MG PO TABS: 40.0000 mg | ORAL_TABLET | Freq: Every day | ORAL | 0 refills | Status: DC

## 2021-09-22 NOTE — Telephone Encounter (Signed)
Reviewed results and recommendations with patient. Instructed her to increase furosemide to 40 mg once daily and then repeat labs in one week. She was agreeable with this plan and had no further questions at this time.

## 2021-09-22 NOTE — Telephone Encounter (Signed)
-----   Message from Creig Hines, NP sent at 09/21/2021  3:50 PM EDT ----- Normal heart squeezing function with mild stiffness of the heart muscle.  Pressures on the right side of the heart are moderately elevated.  No significant valvular disease.  Encouraged by normal heart squeeze.  Stiffness and elevated filling pressures are likely contributing to shortness of breath and lower extremity swelling.  I would like for her to increase Lasix to 40 mg once a day and have a follow-up basic metabolic panel next week.  We can reevaluate symptoms at office follow-up as planned later this month and consider next steps at that time.

## 2021-10-03 ENCOUNTER — Other Ambulatory Visit
Admission: RE | Admit: 2021-10-03 | Discharge: 2021-10-03 | Disposition: A | Discharge status: Home / Self Care | Payer: Medicare Other | Source: Ambulatory Visit | Attending: Nurse Practitioner | Admitting: Nurse Practitioner

## 2021-10-03 DIAGNOSIS — R0602 Shortness of breath: Secondary | ICD-10-CM | POA: Diagnosis present

## 2021-10-03 DIAGNOSIS — I5032 Chronic diastolic (congestive) heart failure: Secondary | ICD-10-CM | POA: Diagnosis present

## 2021-10-03 DIAGNOSIS — I2510 Atherosclerotic heart disease of native coronary artery without angina pectoris: Secondary | ICD-10-CM | POA: Diagnosis present

## 2021-10-03 LAB — BASIC METABOLIC PANEL
Anion gap: 9 (ref 5–15)
BUN: 31 mg/dL — ABNORMAL HIGH (ref 8–23)
CO2: 24 mmol/L (ref 22–32)
Calcium: 9.2 mg/dL (ref 8.9–10.3)
Chloride: 107 mmol/L (ref 98–111)
Creatinine, Ser: 2.02 mg/dL — ABNORMAL HIGH (ref 0.44–1.00)
GFR, Estimated: 25 mL/min — ABNORMAL LOW (ref >60)
Glucose, Bld: 145 mg/dL — ABNORMAL HIGH (ref 70–99)
Potassium: 4.1 mmol/L (ref 3.5–5.1)
Sodium: 140 mmol/L (ref 135–145)

## 2021-10-04 ENCOUNTER — Ambulatory Visit: Payer: Medicare Other | Admitting: Nurse Practitioner

## 2021-10-05 ENCOUNTER — Ambulatory Visit: Payer: Medicare Other | Attending: Nurse Practitioner | Admitting: Nurse Practitioner

## 2021-10-05 ENCOUNTER — Encounter: Payer: Self-pay | Admitting: Nurse Practitioner

## 2021-10-05 VITALS — BP 130/56 | HR 78 | Ht 64.0 in | Wt 255.0 lb

## 2021-10-05 DIAGNOSIS — I2510 Atherosclerotic heart disease of native coronary artery without angina pectoris: Secondary | ICD-10-CM | POA: Diagnosis not present

## 2021-10-05 DIAGNOSIS — I200 Unstable angina: Secondary | ICD-10-CM | POA: Diagnosis not present

## 2021-10-05 DIAGNOSIS — I700 Atherosclerosis of aorta: Secondary | ICD-10-CM | POA: Insufficient documentation

## 2021-10-05 DIAGNOSIS — E785 Hyperlipidemia, unspecified: Secondary | ICD-10-CM | POA: Diagnosis present

## 2021-10-05 DIAGNOSIS — I10 Essential (primary) hypertension: Secondary | ICD-10-CM | POA: Insufficient documentation

## 2021-10-05 DIAGNOSIS — R29898 Other symptoms and signs involving the musculoskeletal system: Secondary | ICD-10-CM | POA: Diagnosis not present

## 2021-10-05 DIAGNOSIS — I5032 Chronic diastolic (congestive) heart failure: Secondary | ICD-10-CM | POA: Diagnosis not present

## 2021-10-05 NOTE — Patient Instructions (Addendum)
Medication Instructions:   NONE  *If you need a refill on your cardiac medications before your next appointment, please call your pharmacy*   Lab Work:  Your physician recommends that you return for lab work [facility] 10/11/2021.  -CBC -BMP  If you have labs (blood work) drawn today and your tests are completely normal, you will receive your results only by: MyChart Message (if you have MyChart) OR A paper copy in the mail If you have any lab test that is abnormal or we need to change your treatment, we will call you to review the results.   Testing/Procedures:   Monica Ray  10/05/2021  You are scheduled for a Cardiac Catheterization on Monday, September 18 with Dr. Lorine Bears.  1. Please arrive at [facility] at 8:30 AM (This time is 1 hour before your procedure to ensure your preparation). Free valet parking service is available.   Special note: Every effort is made to have your procedure done on time. Please understand that emergencies sometimes delay scheduled procedures.  2. Diet: Do not eat solid foods after midnight.  The patient may have clear liquids until 5am upon the day of the procedure.  3. Labs: You will need to have blood drawn on Wednesday, September 6 at [facility], Go to 1st desk on your right to register.  Address: [address] [place], [place] [address]  Open: 8am - 5pm  Phone: [phone]. You do not need to be fasting.  4. Medication instructions in preparation for your procedure:   Contrast Allergy: No    Please hold you Irbesartan 300 mg tablet the day of procedure.   Please hold your Actos 45 mg tablet the day of procedure.  Please take only 1/2 the dose of your insulin the day of your procedure.    5. Plan for one night stay--bring personal belongings. 6. Bring a current list of your medications and current insurance cards. 7. You MUST have a responsible person to drive you home. 8.  Someone MUST be with you the first 24 hours after you arrive home or your discharge will be delayed. 9. Please wear clothes that are easy to get on and off and wear slip-on shoes.  Thank you for allowing [place] to care for you!   -- Monica Ray Invasive Cardiovascular services  Your physician has requested that you have an ankle brachial index (ABI). During this test an ultrasound and blood pressure cuff are used to evaluate the arteries that supply the arms and legs with blood. Allow thirty minutes for this exam. There are no restrictions or special instructions.  Your physician has requested that you have a lower extremity arterial exercise duplex. During this test, exercise and ultrasound are used to evaluate arterial blood flow in the legs. Allow one hour for this exam. There are no restrictions or special instructions.  Follow-Up: At [facility], you and your health needs are our priority.  As part of our continuing mission to provide you with exceptional heart care, we have created designated Provider Care Teams.  These Care Teams include your primary Cardiologist (physician) and Advanced Practice Providers (APPs -  Physician Assistants and Nurse Practitioners) who all work together to provide you with the care you need, when you need it.  We recommend signing up for the patient portal called "MyChart".  Sign up information is provided on this After Visit Summary.  MyChart is used to connect with patients for  Virtual Visits (Telemedicine).  Patients are able to view lab/test results, encounter notes, upcoming appointments, etc.  Non-urgent messages can be sent to your provider as well.   To learn more about what you can do with MyChart, go to [email or web address].    Your next appointment:    F/u the last week of September with Dr. Kirke Corin or Ward Givens, NP  The format for your next appointment:   In Person    Important Information About Sugar

## 2021-10-05 NOTE — Progress Notes (Signed)
Office Visit    Patient Name: Monica Ray Date of Encounter: 10/05/2021  Primary Care Provider:  Marinda Elk, MD Primary Cardiologist:  Monica Sacramento, MD  Chief Complaint    76 year old female with a history of chest pain and nonobstructive CAD, chronic HFpEF, hypertension, hyperlipidemia, diabetes, obesity, and sleep apnea, who presents for follow-up related to dyspnea on exertion.  Past Medical History    Past Medical History:  Diagnosis Date   Anemia    before hysterectomy   Anginal pain (HCC)    Chronic heart failure with preserved ejection fraction (HFpEF) ([place])    a. 09/2021 Echo: EF 60-65%, no rwma, GrI DD, nl RV fxn, RVSP 47.60mmHg. Mildly dil LA.   Depression    Diabetes mellitus, type 2 ([place])    DM retinopathy ([place])    Dyspnea    Edema    GERD (gastroesophageal reflux disease)    Hx of phlebitis    Hyperlipidemia    Hypertension    Mild allergic rhinitis    MRSA (methicillin resistant staph aureus) culture positive 03/2006   Nonobstructive CAD (coronary artery disease)    a. 12/2015 Cath: LAD min irregs, otw nl cors; b. 08/2018 Cor CTA: Mid/Distal LAD and OM1 dzs w/ abnl ctFFR; c. 09/2018 Cath: LM nl, LAD 40p, 17m, D1 50, LCX small-nl, RCA nl, EF 55-65%.   OA (osteoarthritis)    hands   Sleep apnea    CPAP   Past Surgical History:  Procedure Laterality Date   ABDOMINAL HYSTERECTOMY     ADENOIDECTOMY     APPENDECTOMY  1989   barium enema  1998   neg CT   CARDIAC CATHETERIZATION Bilateral 12/08/2015   Procedure: Right/Left Heart Cath and Coronary Angiography;  Surgeon: Monica Hampshire, MD;  Location: [facility];  Service: Cardiovascular;  Laterality: Bilateral;   CATARACT EXTRACTION W/PHACO Left 05/12/2019   Procedure: CATARACT EXTRACTION PHACO AND INTRAOCULAR LENS PLACEMENT ([place]) LEFT VISION BLUE;  Surgeon: Monica Meiers, MD;  Location: [facility];  Service: Ophthalmology;  Laterality: Left;  12.80 1:17.1   CATARACT  EXTRACTION W/PHACO Right 06/11/2019   Procedure: CATARACT EXTRACTION PHACO AND INTRAOCULAR LENS PLACEMENT ([facility]) RIGHT DIABETIC VISION BLUE 12.91  01:21.2;  Surgeon: Monica Meiers, MD;  Location: [facility];  Service: Ophthalmology;  Laterality: Right;  Diabetic - insulin and oral meds   CHOLECYSTECTOMY  1989   LEFT HEART CATH AND CORONARY ANGIOGRAPHY Left 09/15/2018   Procedure: LEFT HEART CATH AND CORONARY ANGIOGRAPHY;  Surgeon: Monica Hampshire, MD;  Location: [facility];  Service: Cardiovascular;  Laterality: Left;   LUMBAR LAMINECTOMY/DECOMPRESSION MICRODISCECTOMY N/A 11/25/2014   Procedure: LUMBAR LAMINECTOMY/DECOMPRESSION MICRODISCECTOMY LUMBAR TWO-THREE, LUMBAR THREE-FOUR ;  Surgeon: Monica Pies, MD;  Location: [facility];  Service: Neurosurgery;  Laterality: N/A;   ROTATOR CUFF REPAIR  08/2001   right    Allergies  Allergies  Allergen Reactions   Tegaderm Ag Mesh [Silver] Rash   Amoxicillin Hives and Rash    Did it involve swelling of the face/tongue/throat, SOB, or low BP? No Did it involve sudden or severe rash/hives, skin peeling, or any reaction on the inside of your mouth or nose? No Did you need to seek medical attention at a hospital or doctor's office? No When did it last happen?  ~2015  If all above answers are "NO", may proceed with cephalosporin use.    Metformin Diarrhea and Nausea And Vomiting   Oxycodone Itching   Ozempic (  0.25 Or 0.5 Mg-Dose) [Semaglutide(0.25 Or 0.5mg -Dos)]    Tape Other (See Comments)    Caused burning sensation   Azithromycin Rash and Itching   Clindamycin Hives and Rash   Fluconazole Itching   Simvastatin Other (See Comments)    Back pain    History of Present Illness    76 year old female with a history of chest pain and nonobstructive CAD, chronic HFpEF, hypertension, hyperlipidemia, diabetes, obesity, and sleep apnea.  In the setting of chest pain, she underwent diagnostic catheterization 2017, which showed  minor irregularities in the LAD and otherwise normal coronary arteries.  She had recurrent chest pain in July 2020, and was evaluated with coronary CT angiogram, which suggested significant LAD and OM1 disease based on CT FFR.  Coronary calcium score was 120.  This was followed by diagnostic catheterization, which showed mild to moderate nonobstructive LAD and diagonal disease, with normal LV function.  She was medically managed.  [name] was last seen in cardiology clinic on July 20 in the setting of significant decline in activity tolerance and progressive dyspnea on exertion.  She reported lower extremity swelling, typically at the end of the day, daytime fatigue and sleepiness (felt the CPAP was not working), and sedentary lifestyle (chronic hip, leg, and low back pain).  2D echocardiogram was performed earlier this month which showed normal LV function (60-65%), mild LVH, grade 1 diastolic dysfunction, and elevated RVSP at 47.8 mmHg.  In the setting of elevated right-sided filling pressures, she was advised to increase her Lasix to 40 mg once a day.  Unfortunately, this resulted in rising creatinine to 2.02, and we cut her back to 20 mg daily.  Today, [name] says that she has continued to have significant dyspnea on exertion and activity intolerance.  She has chronic back, hip, and knee pain and therefore, activity is limited.  She sits in front of a computer all day at work.  She has been experiencing leg weakness and recently completed physical therapy without any significant movement and leg strength.  She is concerned that she may have peripheral arterial disease and does have pain in her legs when she walks, though based on her description, symptoms are more focused in her joints.  The bigger limiting factor in her activity is dyspnea, which occurs after walking only about 20 to 30 feet.  She has to rest frequently when walking longer distances.  She does not experience chest pain.  Since her  last visit, she has had her CPAP evaluated and adjustments were made.  She is now feels that it is working better though she still occasionally notes daytime fatigue and sleepiness.  She denies palpitations, PND, orthopnea, dizziness, syncope, or early satiety.  She typically has mild lower extremity swelling which may or may not increase throughout the day.  Home Medications    Current Outpatient Medications  Medication Sig Dispense Refill   Cholecalciferol (VITAMIN D3) 125 MCG (5000 UT) TABS Take 5,000 Units by mouth daily.      clotrimazole-betamethasone (LOTRISONE) cream Apply 1 application topically 2 (two) times daily as needed (rash).      Cyanocobalamin (VITAMIN B12) 3000 MCG SUBL Take 3,000 mg by mouth daily.     diltiazem (CARDIZEM CD) 120 MG 24 hr capsule Take 1 capsule (120 mg total) by mouth daily. 90 capsule 2   DULoxetine (CYMBALTA) 60 MG capsule Take 60 mg by mouth daily.     furosemide (LASIX) 20 MG tablet Take 2 tablets (40 mg total)  by mouth daily. 180 tablet 0   Insulin Degludec (TRESIBA [place]) Inject 60 mg into the skin daily.     insulin lispro (HUMALOG) 100 UNIT/ML injection Inject 24-36 Units into the skin 3 (three) times daily with meals. 24 units before breakfast and lunch and 28 units before supper - plus sliding scale (4 units per 50 over a target of 150)     irbesartan (AVAPRO) 300 MG tablet Take 1 tablet (300 mg total) by mouth daily. 90 tablet 2   meloxicam (MOBIC) 7.5 MG tablet TAKE ONE CAPSULE BY MOUTH DAILY (Patient taking differently: Take 7.5 mg by mouth daily.) 30 tablet 0   nitroGLYCERIN (NITROSTAT) 0.4 MG SL tablet Place 1 tablet (0.4 mg total) under the tongue every 5 (five) minutes as needed for chest pain. 25 tablet 6   pioglitazone (ACTOS) 45 MG tablet Take 22.5 mg by mouth daily.      rosuvastatin (CRESTOR) 40 MG tablet TAKE 1 TABLET(40 MG) BY MOUTH DAILY (Patient taking differently: Take 40 mg by mouth daily.) 90 tablet 0   TRULICITY 1.5 KK/9.3GH SOPN  Inject 1.5 mg into the skin once a week.     No current facility-administered medications for this visit.    Family History    Family History  Problem Relation Age of Onset   Cancer Mother 67       lung   Diabetes Mother    Alcohol abuse Mother    Alcohol abuse Father    Cancer Father        throat   Aneurysm Father    Diabetes Maternal Grandmother    Aneurysm Paternal Grandmother    Breast cancer Paternal Aunt        63's   Cancer Sister      Social History    Social History   Socioeconomic History   Marital status: Married    Spouse name: Not on file   Number of children: Not on file   Years of education: Not on file   Highest education level: Not on file  Occupational History   Not on file  Tobacco Use   Smoking status: Never   Smokeless tobacco: Never  Vaping Use   Vaping Use: Never used  Substance and Sexual Activity   Alcohol use: No   Drug use: No   Sexual activity: Not on file  Other Topics Concern   Not on file  Social History Narrative   Divorced X 23 years then remarried.   Social Determinants of Health   Financial Resource Strain: Not on file  Food Insecurity: Not on file  Transportation Needs: Not on file  Physical Activity: Not on file  Stress: Not on file  Social Connections: Not on file  Intimate Partner Violence: Not on file     Review of Systems    Positive for progressive dyspnea on exertion, lower extremity weakness and pain with ambulation.  She still has some amount of daytime fatigue and sleepiness despite recent adjustment to her CPAP.  She typically has mild lower extremity swelling which may or may not worsen throughout the day.  She denies palpitations, PND, orthopnea, dizziness, syncope, or early satiety.  All other systems reviewed and are otherwise negative except as noted above.    Physical Exam    VS:  BP (!) 160/56 (BP Location: Left Arm, Patient Position: Sitting)   Pulse 78   Ht $R'5\' 4"'Ds$  (1.626 m)   Wt 255 lb (115.7  kg)   SpO2  93%   BMI 43.77 kg/m  , BMI Body mass index is 43.77 kg/m.     Vitals:   10/05/21 0813 10/05/21 0857  BP: (!) 160/56 (!) 130/56  Pulse: 78   SpO2: 93%     GEN: Obese, in no acute distress. HEENT: normal. Neck: Supple, obese, difficult to gauge JVP.  No bruits or masses.   Cardiac: RRR, 1/6 systolic murmur at the upper sternal borders.  No rubs or gallops.  No clubbing, cyanosis, trace bilateral ankle edema.  Radials/PT 2+ and equal bilaterally.  Respiratory:  Respirations regular and unlabored, clear to auscultation bilaterally. GI: Obese, soft, nontender, nondistended, BS + x 4. MS: no deformity or atrophy. Skin: warm and dry, no rash. Neuro:  Strength and sensation are intact. Psych: Normal affect.  Accessory Clinical Findings     Lab Results  Component Value Date   CREATININE 2.02 (H) 10/03/2021   BUN 31 (H) 10/03/2021   NA 140 10/03/2021   K 4.1 10/03/2021   CL 107 10/03/2021   CO2 24 10/03/2021   Labs from care everywhere dated August 22, 2021   Total cholesterol 112, triglycerides 111, HDL 33.6, LDL 56 ESR 57 CRP less than 1 Total protein 6.6, albumin 3.6, total bilirubin 0.5 alkaline phosphatase 86, AST 16, ALT 15   Labs from care everywhere dated April 26, 2021   Hemoglobin A1c 7.5 Sodium 141, potassium 4.5, chloride 107, CO2 26.4, BUN 24, creatinine 1.1, glucose 146   Labs from care everywhere dated February 09, 2021   Hemoglobin 12.5, hematocrit 38.6, WBC 5.1, platelets 245  Assessment & Plan    1.  Chronic HFpEF/dyspnea on exertion: Over the past year, patient has had progressive dyspnea on exertion with significant reduction in activity tolerance without chest pain.  She can only walk about 20 to 30 feet prior to having to stop and rest.  She has chronic, mild lower extremity swelling which is been managed with Lasix 20 mg daily.  She recently had an echocardiogram which showed an EF of 60 to 65% with grade 1 diastolic dysfunction, normal RV  function, and an RVSP of 47.8 mmHg.  With this finding, I asked her to increase her Lasix to 40 mg daily however, this resulted in an elevation in BUN and creatinine to 31 and 2.02 respectively, and as of August 29, we cut her back to 20 mg daily.  In the setting of ongoing symptoms, we discussed further ischemic evaluation.  She has known nonobstructive coronary artery disease by catheterization in 2020.  Prior to cath, she had a coronary CT angiogram with suggestion of significant LAD and OM1 disease based on CT FFR.  In that setting, repeat coronary CT angiogram is of limited utility, as it is likely to be abnormal.  Likewise, given the dramatic nature of her symptoms, noninvasive stress testing such as Lexiscan PET/CT may be low yield.  We discussed pursuit of right and left heart cardiac catheterization, provided that renal function improves.  The patient understands that risks include but are not limited to stroke (1 in 1000), death (1 in 65), kidney failure [usually temporary] (1 in 500), bleeding (1 in 200), allergic reaction [possibly serious] (1 in 200), and agrees to proceed.  I will plan for follow-up CBC and basic metabolic panel next week.  Provided that renal function stable, she will undergo right and left heart cardiac catheterization with Dr. Fletcher Anon on September 18.  I suspect that she would draw great benefit from the addition  of an SGLT2 inhibitor and spironolactone, though I would like to see her renal function stabilized first, and these can be considered post catheterization.  She is already on a GLP-1 agonist.  2.  Coronary artery disease: Moderate nonobstructive CAD by catheterization in 2020.  She has not been having chest pain but as outlined above, she has had progressive dyspnea on exertion which has been significant lifestyle limiting.  Echo with normal EF and elevated RVSP.  We will plan on right and left heart cardiac catheterization to further evaluate.  Continue aspirin and  statin therapy.  3.  Essential hypertension: Blood pressure was initially elevated at 160/56.  She said she had significant dyspnea when walking in from the parking lot.  When I repeated pressure, it had improved to 130/56.  She remains on diltiazem CD and ARB therapy.  4.  Hyperlipidemia: LDL 56 earlier this year.  Continue statin therapy.  5.  Obstructive sleep apnea: Compliant with CPAP with recent adjustment to her settings and improvement in sleep as well as daytime somnolence and fatigue, though she still occasionally notes the latter.  6.  Morbid obesity/deconditioning: Likely playing a role in her symptoms though further evaluation indicated as outlined above.  She completed physical therapy without any significant improvement in muscle strength or conditioning.  She is already on a GLP-1 agonist through her primary care provider.  7.  Lower extremity weakness and possible claudication: Patient has chronic hip and knee pain, and has noted significant lower extremity weakness with difficulty standing at times.  She just completed PT without any significant improvement in strength.  Symptoms do seem to worsen with ambulation and she is interested in lower extremity arterial duplex/ABIs.  We will order.    8.  Disposition: Follow-up CBC and basic metabolic panel in 1 week.  ABIs ordered.  Plan for diagnostic catheterization on September 18.  Follow-up in clinic approximately 1 to 2 weeks post catheterization.  Murray Hodgkins, NP 10/05/2021, 8:57 AM

## 2021-10-05 NOTE — H&P (View-Only) (Signed)
Office Visit    Patient Name: Monica Ray Date of Encounter: 10/05/2021  Primary Care Provider:  Marinda Elk, MD Primary Cardiologist:  Kathlyn Sacramento, MD  Chief Complaint    76 year old female with a history of chest pain and nonobstructive CAD, chronic HFpEF, hypertension, hyperlipidemia, diabetes, obesity, and sleep apnea, who presents for follow-up related to dyspnea on exertion.  Past Medical History    Past Medical History:  Diagnosis Date   Anemia    before hysterectomy   Anginal pain (HCC)    Chronic heart failure with preserved ejection fraction (HFpEF) ([place])    a. 09/2021 Echo: EF 60-65%, no rwma, GrI DD, nl RV fxn, RVSP 47.44mmHg. Mildly dil LA.   Depression    Diabetes mellitus, type 2 ([place])    DM retinopathy ([place])    Dyspnea    Edema    GERD (gastroesophageal reflux disease)    Hx of phlebitis    Hyperlipidemia    Hypertension    Mild allergic rhinitis    MRSA (methicillin resistant staph aureus) culture positive 03/2006   Nonobstructive CAD (coronary artery disease)    a. 12/2015 Cath: LAD min irregs, otw nl cors; b. 08/2018 Cor CTA: Mid/Distal LAD and OM1 dzs w/ abnl ctFFR; c. 09/2018 Cath: LM nl, LAD 40p, 47m, D1 50, LCX small-nl, RCA nl, EF 55-65%.   OA (osteoarthritis)    hands   Sleep apnea    CPAP   Past Surgical History:  Procedure Laterality Date   ABDOMINAL HYSTERECTOMY     ADENOIDECTOMY     APPENDECTOMY  1989   barium enema  1998   neg CT   CARDIAC CATHETERIZATION Bilateral 12/08/2015   Procedure: Right/Left Heart Cath and Coronary Angiography;  Surgeon: Wellington Hampshire, MD;  Location: [facility];  Service: Cardiovascular;  Laterality: Bilateral;   CATARACT EXTRACTION W/PHACO Left 05/12/2019   Procedure: CATARACT EXTRACTION PHACO AND INTRAOCULAR LENS PLACEMENT ([place]) LEFT VISION BLUE;  Surgeon: Marchia Meiers, MD;  Location: [place];  Service: Ophthalmology;  Laterality: Left;  12.80 1:17.1   CATARACT  EXTRACTION W/PHACO Right 06/11/2019   Procedure: CATARACT EXTRACTION PHACO AND INTRAOCULAR LENS PLACEMENT ([facility]) RIGHT DIABETIC VISION BLUE 12.91  01:21.2;  Surgeon: Marchia Meiers, MD;  Location: [facility];  Service: Ophthalmology;  Laterality: Right;  Diabetic - insulin and oral meds   CHOLECYSTECTOMY  1989   LEFT HEART CATH AND CORONARY ANGIOGRAPHY Left 09/15/2018   Procedure: LEFT HEART CATH AND CORONARY ANGIOGRAPHY;  Surgeon: Wellington Hampshire, MD;  Location: [facility];  Service: Cardiovascular;  Laterality: Left;   LUMBAR LAMINECTOMY/DECOMPRESSION MICRODISCECTOMY N/A 11/25/2014   Procedure: LUMBAR LAMINECTOMY/DECOMPRESSION MICRODISCECTOMY LUMBAR TWO-THREE, LUMBAR THREE-FOUR ;  Surgeon: Newman Pies, MD;  Location: [facility];  Service: Neurosurgery;  Laterality: N/A;   ROTATOR CUFF REPAIR  08/2001   right    Allergies  Allergies  Allergen Reactions   Tegaderm Ag Mesh [Silver] Rash   Amoxicillin Hives and Rash    Did it involve swelling of the face/tongue/throat, SOB, or low BP? No Did it involve sudden or severe rash/hives, skin peeling, or any reaction on the inside of your mouth or nose? No Did you need to seek medical attention at a hospital or doctor's office? No When did it last happen?  ~2015  If all above answers are "NO", may proceed with cephalosporin use.    Metformin Diarrhea and Nausea And Vomiting   Oxycodone Itching   Ozempic (  0.25 Or 0.5 Mg-Dose) [Semaglutide(0.25 Or 0.5mg -Dos)]    Tape Other (See Comments)    Caused burning sensation   Azithromycin Rash and Itching   Clindamycin Hives and Rash   Fluconazole Itching   Simvastatin Other (See Comments)    Back pain    History of Present Illness    76 year old female with a history of chest pain and nonobstructive CAD, chronic HFpEF, hypertension, hyperlipidemia, diabetes, obesity, and sleep apnea.  In the setting of chest pain, she underwent diagnostic catheterization 2017, which showed  minor irregularities in the LAD and otherwise normal coronary arteries.  She had recurrent chest pain in July 2020, and was evaluated with coronary CT angiogram, which suggested significant LAD and OM1 disease based on CT FFR.  Coronary calcium score was 120.  This was followed by diagnostic catheterization, which showed mild to moderate nonobstructive LAD and diagonal disease, with normal LV function.  She was medically managed.  Monica Ray was last seen in cardiology clinic on July 20 in the setting of significant decline in activity tolerance and progressive dyspnea on exertion.  She reported lower extremity swelling, typically at the end of the day, daytime fatigue and sleepiness (felt the CPAP was not working), and sedentary lifestyle (chronic hip, leg, and low back pain).  2D echocardiogram was performed earlier this month which showed normal LV function (60-65%), mild LVH, grade 1 diastolic dysfunction, and elevated RVSP at 47.8 mmHg.  In the setting of elevated right-sided filling pressures, she was advised to increase her Lasix to 40 mg once a day.  Unfortunately, this resulted in rising creatinine to 2.02, and we cut her back to 20 mg daily.  Today, Monica Ray says that she has continued to have significant dyspnea on exertion and activity intolerance.  She has chronic back, hip, and knee pain and therefore, activity is limited.  She sits in front of a computer all day at work.  She has been experiencing leg weakness and recently completed physical therapy without any significant movement and leg strength.  She is concerned that she may have peripheral arterial disease and does have pain in her legs when she walks, though based on her description, symptoms are more focused in her joints.  The bigger limiting factor in her activity is dyspnea, which occurs after walking only about 20 to 30 feet.  She has to rest frequently when walking longer distances.  She does not experience chest pain.  Since her  last visit, she has had her CPAP evaluated and adjustments were made.  She is now feels that it is working better though she still occasionally notes daytime fatigue and sleepiness.  She denies palpitations, PND, orthopnea, dizziness, syncope, or early satiety.  She typically has mild lower extremity swelling which may or may not increase throughout the day.  Home Medications    Current Outpatient Medications  Medication Sig Dispense Refill   Cholecalciferol (VITAMIN D3) 125 MCG (5000 UT) TABS Take 5,000 Units by mouth daily.      clotrimazole-betamethasone (LOTRISONE) cream Apply 1 application topically 2 (two) times daily as needed (rash).      Cyanocobalamin (VITAMIN B12) 3000 MCG SUBL Take 3,000 mg by mouth daily.     diltiazem (CARDIZEM CD) 120 MG 24 hr capsule Take 1 capsule (120 mg total) by mouth daily. 90 capsule 2   DULoxetine (CYMBALTA) 60 MG capsule Take 60 mg by mouth daily.     furosemide (LASIX) 20 MG tablet Take 2 tablets (40 mg total)  by mouth daily. 180 tablet 0   Insulin Degludec (TRESIBA [place]) Inject 60 mg into the skin daily.     insulin lispro (HUMALOG) 100 UNIT/ML injection Inject 24-36 Units into the skin 3 (three) times daily with meals. 24 units before breakfast and lunch and 28 units before supper - plus sliding scale (4 units per 50 over a target of 150)     irbesartan (AVAPRO) 300 MG tablet Take 1 tablet (300 mg total) by mouth daily. 90 tablet 2   meloxicam (MOBIC) 7.5 MG tablet TAKE ONE CAPSULE BY MOUTH DAILY (Patient taking differently: Take 7.5 mg by mouth daily.) 30 tablet 0   nitroGLYCERIN (NITROSTAT) 0.4 MG SL tablet Place 1 tablet (0.4 mg total) under the tongue every 5 (five) minutes as needed for chest pain. 25 tablet 6   pioglitazone (ACTOS) 45 MG tablet Take 22.5 mg by mouth daily.      rosuvastatin (CRESTOR) 40 MG tablet TAKE 1 TABLET(40 MG) BY MOUTH DAILY (Patient taking differently: Take 40 mg by mouth daily.) 90 tablet 0   TRULICITY 1.5 ZJ/6.7HA SOPN  Inject 1.5 mg into the skin once a week.     No current facility-administered medications for this visit.    Family History    Family History  Problem Relation Age of Onset   Cancer Mother 54       lung   Diabetes Mother    Alcohol abuse Mother    Alcohol abuse Father    Cancer Father        throat   Aneurysm Father    Diabetes Maternal Grandmother    Aneurysm Paternal Grandmother    Breast cancer Paternal Aunt        38's   Cancer Sister      Social History    Social History   Socioeconomic History   Marital status: Married    Spouse name: Not on file   Number of children: Not on file   Years of education: Not on file   Highest education level: Not on file  Occupational History   Not on file  Tobacco Use   Smoking status: Never   Smokeless tobacco: Never  Vaping Use   Vaping Use: Never used  Substance and Sexual Activity   Alcohol use: No   Drug use: No   Sexual activity: Not on file  Other Topics Concern   Not on file  Social History Narrative   Divorced X 23 years then remarried.   Social Determinants of Health   Financial Resource Strain: Not on file  Food Insecurity: Not on file  Transportation Needs: Not on file  Physical Activity: Not on file  Stress: Not on file  Social Connections: Not on file  Intimate Partner Violence: Not on file     Review of Systems    Positive for progressive dyspnea on exertion, lower extremity weakness and pain with ambulation.  She still has some amount of daytime fatigue and sleepiness despite recent adjustment to her CPAP.  She typically has mild lower extremity swelling which may or may not worsen throughout the day.  She denies palpitations, PND, orthopnea, dizziness, syncope, or early satiety.  All other systems reviewed and are otherwise negative except as noted above.    Physical Exam    VS:  BP (!) 160/56 (BP Location: Left Arm, Patient Position: Sitting)   Pulse 78   Ht $R'5\' 4"'la$  (1.626 m)   Wt 255 lb (115.7  kg)   SpO2  93%   BMI 43.77 kg/m  , BMI Body mass index is 43.77 kg/m.     Vitals:   10/05/21 0813 10/05/21 0857  BP: (!) 160/56 (!) 130/56  Pulse: 78   SpO2: 93%     GEN: Obese, in no acute distress. HEENT: normal. Neck: Supple, obese, difficult to gauge JVP.  No bruits or masses.   Cardiac: RRR, 1/6 systolic murmur at the upper sternal borders.  No rubs or gallops.  No clubbing, cyanosis, trace bilateral ankle edema.  Radials/PT 2+ and equal bilaterally.  Respiratory:  Respirations regular and unlabored, clear to auscultation bilaterally. GI: Obese, soft, nontender, nondistended, BS + x 4. MS: no deformity or atrophy. Skin: warm and dry, no rash. Neuro:  Strength and sensation are intact. Psych: Normal affect.  Accessory Clinical Findings     Lab Results  Component Value Date   CREATININE 2.02 (H) 10/03/2021   BUN 31 (H) 10/03/2021   NA 140 10/03/2021   K 4.1 10/03/2021   CL 107 10/03/2021   CO2 24 10/03/2021   Labs from care everywhere dated August 22, 2021   Total cholesterol 112, triglycerides 111, HDL 33.6, LDL 56 ESR 57 CRP less than 1 Total protein 6.6, albumin 3.6, total bilirubin 0.5 alkaline phosphatase 86, AST 16, ALT 15   Labs from care everywhere dated April 26, 2021   Hemoglobin A1c 7.5 Sodium 141, potassium 4.5, chloride 107, CO2 26.4, BUN 24, creatinine 1.1, glucose 146   Labs from care everywhere dated February 09, 2021   Hemoglobin 12.5, hematocrit 38.6, WBC 5.1, platelets 245  Assessment & Plan    1.  Chronic HFpEF/dyspnea on exertion: Over the past year, patient has had progressive dyspnea on exertion with significant reduction in activity tolerance without chest pain.  She can only walk about 20 to 30 feet prior to having to stop and rest.  She has chronic, mild lower extremity swelling which is been managed with Lasix 20 mg daily.  She recently had an echocardiogram which showed an EF of 60 to 65% with grade 1 diastolic dysfunction, normal RV  function, and an RVSP of 47.8 mmHg.  With this finding, I asked her to increase her Lasix to 40 mg daily however, this resulted in an elevation in BUN and creatinine to 31 and 2.02 respectively, and as of August 29, we cut her back to 20 mg daily.  In the setting of ongoing symptoms, we discussed further ischemic evaluation.  She has known nonobstructive coronary artery disease by catheterization in 2020.  Prior to cath, she had a coronary CT angiogram with suggestion of significant LAD and OM1 disease based on CT FFR.  In that setting, repeat coronary CT angiogram is of limited utility, as it is likely to be abnormal.  Likewise, given the dramatic nature of her symptoms, noninvasive stress testing such as Lexiscan PET/CT may be low yield.  We discussed pursuit of right and left heart cardiac catheterization, provided that renal function improves.  The patient understands that risks include but are not limited to stroke (1 in 1000), death (1 in 45), kidney failure [usually temporary] (1 in 500), bleeding (1 in 200), allergic reaction [possibly serious] (1 in 200), and agrees to proceed.  I will plan for follow-up CBC and basic metabolic panel next week.  Provided that renal function stable, she will undergo right and left heart cardiac catheterization with Dr. Fletcher Anon on September 18.  I suspect that she would draw great benefit from the addition  of an SGLT2 inhibitor and spironolactone, though I would like to see her renal function stabilized first, and these can be considered post catheterization.  She is already on a GLP-1 agonist.  2.  Coronary artery disease: Moderate nonobstructive CAD by catheterization in 2020.  She has not been having chest pain but as outlined above, she has had progressive dyspnea on exertion which has been significant lifestyle limiting.  Echo with normal EF and elevated RVSP.  We will plan on right and left heart cardiac catheterization to further evaluate.  Continue aspirin and  statin therapy.  3.  Essential hypertension: Blood pressure was initially elevated at 160/56.  She said she had significant dyspnea when walking in from the parking lot.  When I repeated pressure, it had improved to 130/56.  She remains on diltiazem CD and ARB therapy.  4.  Hyperlipidemia: LDL 56 earlier this year.  Continue statin therapy.  5.  Obstructive sleep apnea: Compliant with CPAP with recent adjustment to her settings and improvement in sleep as well as daytime somnolence and fatigue, though she still occasionally notes the latter.  6.  Morbid obesity/deconditioning: Likely playing a role in her symptoms though further evaluation indicated as outlined above.  She completed physical therapy without any significant improvement in muscle strength or conditioning.  She is already on a GLP-1 agonist through her primary care provider.  7.  Lower extremity weakness and possible claudication: Patient has chronic hip and knee pain, and has noted significant lower extremity weakness with difficulty standing at times.  She just completed PT without any significant improvement in strength.  Symptoms do seem to worsen with ambulation and she is interested in lower extremity arterial duplex/ABIs.  We will order.    8.  Disposition: Follow-up CBC and basic metabolic panel in 1 week.  ABIs ordered.  Plan for diagnostic catheterization on September 18.  Follow-up in clinic approximately 1 to 2 weeks post catheterization.  Murray Hodgkins, NP 10/05/2021, 8:57 AM

## 2021-10-12 ENCOUNTER — Other Ambulatory Visit
Admission: RE | Admit: 2021-10-12 | Discharge: 2021-10-12 | Disposition: A | Discharge status: Home / Self Care | Payer: Medicare Other | Attending: Nurse Practitioner | Admitting: Nurse Practitioner

## 2021-10-12 DIAGNOSIS — E785 Hyperlipidemia, unspecified: Secondary | ICD-10-CM | POA: Diagnosis present

## 2021-10-12 DIAGNOSIS — I10 Essential (primary) hypertension: Secondary | ICD-10-CM | POA: Diagnosis present

## 2021-10-12 DIAGNOSIS — I2510 Atherosclerotic heart disease of native coronary artery without angina pectoris: Secondary | ICD-10-CM | POA: Insufficient documentation

## 2021-10-12 DIAGNOSIS — I5032 Chronic diastolic (congestive) heart failure: Secondary | ICD-10-CM | POA: Insufficient documentation

## 2021-10-12 DIAGNOSIS — R29898 Other symptoms and signs involving the musculoskeletal system: Secondary | ICD-10-CM | POA: Insufficient documentation

## 2021-10-12 LAB — BASIC METABOLIC PANEL
Anion gap: 9 (ref 5–15)
BUN: 27 mg/dL — ABNORMAL HIGH (ref 8–23)
CO2: 25 mmol/L (ref 22–32)
Calcium: 9.3 mg/dL (ref 8.9–10.3)
Chloride: 108 mmol/L (ref 98–111)
Creatinine, Ser: 1.26 mg/dL — ABNORMAL HIGH (ref 0.44–1.00)
GFR, Estimated: 45 mL/min — ABNORMAL LOW (ref >60)
Glucose, Bld: 108 mg/dL — ABNORMAL HIGH (ref 70–99)
Potassium: 4.4 mmol/L (ref 3.5–5.1)
Sodium: 142 mmol/L (ref 135–145)

## 2021-10-12 LAB — CBC
HCT: 39.4 % (ref 36.0–46.0)
Hemoglobin: 12.5 g/dL (ref 12.0–15.0)
MCH: 30.4 pg (ref 26.0–34.0)
MCHC: 31.7 g/dL (ref 30.0–36.0)
MCV: 95.9 fL (ref 80.0–100.0)
Platelets: 236 10*3/uL (ref 150–400)
RBC: 4.11 MIL/uL (ref 3.87–5.11)
RDW: 13.9 % (ref 11.5–15.5)
WBC: 5.3 10*3/uL (ref 4.0–10.5)
nRBC: 0 % (ref 0.0–0.2)

## 2021-10-23 ENCOUNTER — Encounter
Admission: RE | Disposition: A | Discharge status: Home / Self Care | Payer: Medicare Other | Source: Home / Self Care | Attending: Cardiovascular Disease

## 2021-10-23 ENCOUNTER — Other Ambulatory Visit: Payer: Self-pay

## 2021-10-23 ENCOUNTER — Ambulatory Visit
Admission: RE | Admit: 2021-10-23 | Discharge: 2021-10-23 | Disposition: A | Discharge status: Home / Self Care | Payer: Medicare Other | Attending: Cardiovascular Disease | Admitting: Cardiovascular Disease

## 2021-10-23 ENCOUNTER — Encounter: Payer: Self-pay | Admitting: Cardiovascular Disease

## 2021-10-23 DIAGNOSIS — Z7985 Long-term (current) use of injectable non-insulin antidiabetic drugs: Secondary | ICD-10-CM | POA: Insufficient documentation

## 2021-10-23 DIAGNOSIS — Z7982 Long term (current) use of aspirin: Secondary | ICD-10-CM | POA: Diagnosis not present

## 2021-10-23 DIAGNOSIS — I200 Unstable angina: Secondary | ICD-10-CM

## 2021-10-23 DIAGNOSIS — M25569 Pain in unspecified knee: Secondary | ICD-10-CM | POA: Insufficient documentation

## 2021-10-23 DIAGNOSIS — Z794 Long term (current) use of insulin: Secondary | ICD-10-CM | POA: Insufficient documentation

## 2021-10-23 DIAGNOSIS — G4733 Obstructive sleep apnea (adult) (pediatric): Secondary | ICD-10-CM | POA: Insufficient documentation

## 2021-10-23 DIAGNOSIS — E669 Obesity, unspecified: Secondary | ICD-10-CM | POA: Diagnosis not present

## 2021-10-23 DIAGNOSIS — Z79899 Other long term (current) drug therapy: Secondary | ICD-10-CM | POA: Diagnosis not present

## 2021-10-23 DIAGNOSIS — I110 Hypertensive heart disease with heart failure: Secondary | ICD-10-CM | POA: Diagnosis not present

## 2021-10-23 DIAGNOSIS — I25110 Atherosclerotic heart disease of native coronary artery with unstable angina pectoris: Secondary | ICD-10-CM | POA: Insufficient documentation

## 2021-10-23 DIAGNOSIS — R0609 Other forms of dyspnea: Secondary | ICD-10-CM | POA: Diagnosis not present

## 2021-10-23 DIAGNOSIS — E785 Hyperlipidemia, unspecified: Secondary | ICD-10-CM | POA: Insufficient documentation

## 2021-10-23 DIAGNOSIS — E119 Type 2 diabetes mellitus without complications: Secondary | ICD-10-CM | POA: Insufficient documentation

## 2021-10-23 DIAGNOSIS — I272 Pulmonary hypertension, unspecified: Secondary | ICD-10-CM | POA: Diagnosis not present

## 2021-10-23 DIAGNOSIS — Z6841 Body Mass Index (BMI) 40.0 and over, adult: Secondary | ICD-10-CM | POA: Diagnosis not present

## 2021-10-23 DIAGNOSIS — I5032 Chronic diastolic (congestive) heart failure: Secondary | ICD-10-CM | POA: Diagnosis not present

## 2021-10-23 DIAGNOSIS — R531 Weakness: Secondary | ICD-10-CM | POA: Diagnosis not present

## 2021-10-23 HISTORY — PX: RIGHT/LEFT HEART CATH AND CORONARY ANGIOGRAPHY: CATH118266

## 2021-10-23 LAB — GLUCOSE, CAPILLARY
Glucose-Capillary: 91 mg/dL (ref 70–99)
Glucose-Capillary: 71 mg/dL (ref 70–99)

## 2021-10-23 SURGERY — RIGHT/LEFT HEART CATH AND CORONARY ANGIOGRAPHY
Anesthesia: Moderate Sedation | Laterality: Bilateral

## 2021-10-23 MED ORDER — SODIUM CHLORIDE 0.9% FLUSH: 3.0000 mL | INTRAVENOUS | Status: DC | PRN

## 2021-10-23 MED ORDER — SPIRONOLACTONE 25 MG PO TABS: 25.0000 mg | ORAL_TABLET | Freq: Every day | ORAL | 3 refills | Status: DC

## 2021-10-23 MED ORDER — SODIUM CHLORIDE 0.9 % IV SOLN: 250.0000 mL | INTRAVENOUS | Status: DC | PRN

## 2021-10-23 MED ORDER — ACETAMINOPHEN 325 MG PO TABS: 650.0000 mg | ORAL_TABLET | ORAL | Status: DC | PRN

## 2021-10-23 MED ORDER — VERAPAMIL HCL 2.5 MG/ML IV SOLN
INTRAVENOUS | Status: DC | PRN
  Administered 2021-10-23: 2.5 mg via INTRA_ARTERIAL

## 2021-10-23 MED ORDER — MIDAZOLAM HCL 2 MG/2ML IJ SOLN
INTRAMUSCULAR | Status: AC
  Filled 2021-10-23: qty 2

## 2021-10-23 MED ORDER — HEPARIN (PORCINE) IN NACL 1000-0.9 UT/500ML-% IV SOLN
INTRAVENOUS | Status: DC | PRN
  Administered 2021-10-23: 1000 mL

## 2021-10-23 MED ORDER — LIDOCAINE HCL 1 % IJ SOLN
INTRAMUSCULAR | Status: AC
  Filled 2021-10-23: qty 20

## 2021-10-23 MED ORDER — SODIUM CHLORIDE 0.9 % IV SOLN: INTRAVENOUS | Status: DC

## 2021-10-23 MED ORDER — HEPARIN SODIUM (PORCINE) 1000 UNIT/ML IJ SOLN
INTRAMUSCULAR | Status: DC | PRN
  Administered 2021-10-23: 5000 [IU] via INTRAVENOUS

## 2021-10-23 MED ORDER — ASPIRIN 81 MG PO CHEW
CHEWABLE_TABLET | ORAL | Status: AC
  Filled 2021-10-23: qty 1

## 2021-10-23 MED ORDER — ONDANSETRON HCL 4 MG/2ML IJ SOLN: 4.0000 mg | Freq: Four times a day (QID) | INTRAMUSCULAR | Status: DC | PRN

## 2021-10-23 MED ORDER — HEPARIN SODIUM (PORCINE) 1000 UNIT/ML IJ SOLN
INTRAMUSCULAR | Status: AC
  Filled 2021-10-23: qty 10

## 2021-10-23 MED ORDER — HEPARIN (PORCINE) IN NACL 1000-0.9 UT/500ML-% IV SOLN
INTRAVENOUS | Status: AC
  Filled 2021-10-23: qty 1000

## 2021-10-23 MED ORDER — IOHEXOL 300 MG/ML  SOLN
INTRAMUSCULAR | Status: DC | PRN
  Administered 2021-10-23: 36 mL

## 2021-10-23 MED ORDER — FENTANYL CITRATE (PF) 100 MCG/2ML IJ SOLN
INTRAMUSCULAR | Status: DC | PRN
  Administered 2021-10-23: 50 ug via INTRAVENOUS

## 2021-10-23 MED ORDER — ASPIRIN 81 MG PO CHEW
81.0000 mg | CHEWABLE_TABLET | ORAL | Status: AC
  Administered 2021-10-23: 81 mg via ORAL

## 2021-10-23 MED ORDER — SODIUM CHLORIDE 0.9% FLUSH: 3.0000 mL | Freq: Two times a day (BID) | INTRAVENOUS | Status: DC

## 2021-10-23 MED ORDER — FUROSEMIDE 20 MG PO TABS: 20.0000 mg | ORAL_TABLET | Freq: Every day | ORAL | 0 refills | Status: DC

## 2021-10-23 MED ORDER — LIDOCAINE HCL (PF) 1 % IJ SOLN
INTRAMUSCULAR | Status: DC | PRN
  Administered 2021-10-23 (×2): 5 mL

## 2021-10-23 MED ORDER — MIDAZOLAM HCL 2 MG/2ML IJ SOLN
INTRAMUSCULAR | Status: DC | PRN
  Administered 2021-10-23: 1 mg via INTRAVENOUS

## 2021-10-23 MED ORDER — FENTANYL CITRATE (PF) 100 MCG/2ML IJ SOLN
INTRAMUSCULAR | Status: AC
  Filled 2021-10-23: qty 2

## 2021-10-23 MED ORDER — VERAPAMIL HCL 2.5 MG/ML IV SOLN
INTRAVENOUS | Status: AC
  Filled 2021-10-23: qty 2

## 2021-10-23 SURGICAL SUPPLY — 13 items
BAND ZEPHYR COMPRESS 30 LONG (HEMOSTASIS) IMPLANT
CATH 5F 110X4 TIG (CATHETERS) IMPLANT
CATH BALLN WEDGE 5F 110CM (CATHETERS) IMPLANT
DRAPE BRACHIAL (DRAPES) IMPLANT
GLIDESHEATH SLEND SS 6F .021 (SHEATH) IMPLANT
GUIDEWIRE .025 260CM (WIRE) IMPLANT
GUIDEWIRE INQWIRE 1.5J.035X260 (WIRE) IMPLANT
INQWIRE 1.5J .035X260CM (WIRE) ×1
PACK CARDIAC CATH (CUSTOM PROCEDURE TRAY) ×1 IMPLANT
PROTECTION STATION PRESSURIZED (MISCELLANEOUS) ×1
SET ATX SIMPLICITY (MISCELLANEOUS) IMPLANT
SHEATH GLIDE SLENDER 4/5FR (SHEATH) IMPLANT
STATION PROTECTION PRESSURIZED (MISCELLANEOUS) IMPLANT

## 2021-10-23 NOTE — Interval H&P Note (Signed)
History and Physical Interval Note:  10/23/2021 9:41 AM  Monica Ray  has presented today for surgery, with the diagnosis of L and R Cath   Unstable angina.  The various methods of treatment have been discussed with the patient and family. After consideration of risks, benefits and other options for treatment, the patient has consented to  Procedure(s): RIGHT/Ray HEART CATH AND CORONARY ANGIOGRAPHY (Bilateral) as a surgical intervention.  The patient's history has been reviewed, patient examined, no change in status, stable for surgery.  I have reviewed the patient's chart and labs.  Questions were answered to the patient's satisfaction.     Kathlyn Sacramento

## 2021-10-31 ENCOUNTER — Ambulatory Visit: Payer: Medicare Other | Attending: Nurse Practitioner | Admitting: Nurse Practitioner

## 2021-10-31 ENCOUNTER — Other Ambulatory Visit
Admission: RE | Admit: 2021-10-31 | Discharge: 2021-10-31 | Disposition: A | Discharge status: Home / Self Care | Payer: Medicare Other | Source: Ambulatory Visit | Attending: Nurse Practitioner | Admitting: Nurse Practitioner

## 2021-10-31 ENCOUNTER — Encounter: Payer: Self-pay | Admitting: Nurse Practitioner

## 2021-10-31 VITALS — BP 124/60 | HR 60 | Ht 64.0 in | Wt 252.8 lb

## 2021-10-31 DIAGNOSIS — I200 Unstable angina: Secondary | ICD-10-CM

## 2021-10-31 DIAGNOSIS — I2510 Atherosclerotic heart disease of native coronary artery without angina pectoris: Secondary | ICD-10-CM | POA: Insufficient documentation

## 2021-10-31 DIAGNOSIS — E785 Hyperlipidemia, unspecified: Secondary | ICD-10-CM | POA: Insufficient documentation

## 2021-10-31 DIAGNOSIS — I5032 Chronic diastolic (congestive) heart failure: Secondary | ICD-10-CM | POA: Insufficient documentation

## 2021-10-31 DIAGNOSIS — N183 Chronic kidney disease, stage 3 unspecified: Secondary | ICD-10-CM | POA: Insufficient documentation

## 2021-10-31 DIAGNOSIS — R29898 Other symptoms and signs involving the musculoskeletal system: Secondary | ICD-10-CM | POA: Insufficient documentation

## 2021-10-31 DIAGNOSIS — I10 Essential (primary) hypertension: Secondary | ICD-10-CM | POA: Diagnosis not present

## 2021-10-31 DIAGNOSIS — G4733 Obstructive sleep apnea (adult) (pediatric): Secondary | ICD-10-CM | POA: Insufficient documentation

## 2021-10-31 LAB — BASIC METABOLIC PANEL
Anion gap: 11 (ref 5–15)
BUN: 42 mg/dL — ABNORMAL HIGH (ref 8–23)
CO2: 24 mmol/L (ref 22–32)
Calcium: 9.7 mg/dL (ref 8.9–10.3)
Chloride: 102 mmol/L (ref 98–111)
Creatinine, Ser: 1.62 mg/dL — ABNORMAL HIGH (ref 0.44–1.00)
GFR, Estimated: 33 mL/min — ABNORMAL LOW (ref >60)
Glucose, Bld: 176 mg/dL — ABNORMAL HIGH (ref 70–99)
Potassium: 4.5 mmol/L (ref 3.5–5.1)
Sodium: 137 mmol/L (ref 135–145)

## 2021-10-31 MED ORDER — NITROGLYCERIN 0.4 MG SL SUBL: 0.4000 mg | SUBLINGUAL_TABLET | SUBLINGUAL | 3 refills | Status: DC | PRN

## 2021-10-31 NOTE — Patient Instructions (Addendum)
Medication Instructions:  Your physician recommends that you continue on your current medications as directed. Please refer to the Current Medication list given to you today.  *If you need a refill on your cardiac medications before your next appointment, please call your pharmacy*   Lab Work: BMET today over at the [facility] and check in at registration.  If you have labs (blood work) drawn today and your tests are completely normal, you will receive your results only by: Monica Ray (if you have MyChart) OR A paper copy in the mail If you have any lab test that is abnormal or we need to change your treatment, we will call you to review the results.   Testing/Procedures: None   Follow-Up: At [facility], you and your health needs are our priority.  As part of our continuing mission to provide you with exceptional heart care, we have created designated Provider Care Teams.  These Care Teams include your primary Cardiologist (physician) and Advanced Practice Providers (APPs -  Physician Assistants and Nurse Practitioners) who all work together to provide you with the care you need, when you need it.   Your next appointment:   6 week(s)  The format for your next appointment:   In Person  Provider:   Kathlyn Sacramento, MD or Murray Hodgkins, NP        Important Information About Sugar

## 2021-10-31 NOTE — Progress Notes (Signed)
Office Visit    Patient Name: Monica Ray Date of Encounter: 10/31/2021  Primary Care Provider:  Marinda Elk, MD Primary Cardiologist:  Kathlyn Sacramento, MD  Chief Complaint    76 year old female with a history of chest pain and nonobstructive CAD, chronic HFpEF, hypertension, hyperlipidemia, diabetes, obesity, and sleep apnea, presents for follow-up after recent diagnostic catheterization.  Past Medical History    Past Medical History:  Diagnosis Date   Anemia    before hysterectomy   Anginal pain (HCC)    Chronic heart failure with preserved ejection fraction (HFpEF) ([place])    a. 09/2021 Echo: EF 60-65%, no rwma, GrI DD, nl RV fxn, RVSP 47.55mmHg. Mildly dil LA; b. 10/2021 RHC: RA 15, RV 50/9, PA 48/21 (32), PCWP 21, LVEDP 27.   Depression    Diabetes mellitus, type 2 ([place])    DM retinopathy ([place])    Dyspnea    Edema    GERD (gastroesophageal reflux disease)    Hx of phlebitis    Hyperlipidemia    Hypertension    Mild allergic rhinitis    MRSA (methicillin resistant staph aureus) culture positive 03/2006   Nonobstructive CAD (coronary artery disease)    a. 12/2015 Cath: LAD min irregs, otw nl cors; b. 08/2018 Cor CTA: Mid/Distal LAD and OM1 dzs w/ abnl ctFFR; c. 09/2018 Cath: LM nl, LAD 40p, 23m, D1 50, LCX small-nl, RCA nl, EF 55-65%; c. 10/2021 Cath: LM nl, LAD 40p/79m, lat D1 60, LCX sm nl, OM1 nl, RCA large, nl, RPDA/PAV/PL1-3 nl.   OA (osteoarthritis)    hands   Sleep apnea    CPAP   Past Surgical History:  Procedure Laterality Date   ABDOMINAL HYSTERECTOMY     ADENOIDECTOMY     APPENDECTOMY  1989   barium enema  1998   neg CT   CARDIAC CATHETERIZATION Bilateral 12/08/2015   Procedure: Right/Left Heart Cath and Coronary Angiography;  Surgeon: Wellington Hampshire, MD;  Location: [facility];  Service: Cardiovascular;  Laterality: Bilateral;   CATARACT EXTRACTION W/PHACO Left 05/12/2019   Procedure: CATARACT EXTRACTION PHACO AND INTRAOCULAR LENS  PLACEMENT ([place]) LEFT VISION BLUE;  Surgeon: Marchia Meiers, MD;  Location: [facility];  Service: Ophthalmology;  Laterality: Left;  12.80 1:17.1   CATARACT EXTRACTION W/PHACO Right 06/11/2019   Procedure: CATARACT EXTRACTION PHACO AND INTRAOCULAR LENS PLACEMENT ([facility]) RIGHT DIABETIC VISION BLUE 12.91  01:21.2;  Surgeon: Marchia Meiers, MD;  Location: [facility];  Service: Ophthalmology;  Laterality: Right;  Diabetic - insulin and oral meds   CHOLECYSTECTOMY  1989   LEFT HEART CATH AND CORONARY ANGIOGRAPHY Left 09/15/2018   Procedure: LEFT HEART CATH AND CORONARY ANGIOGRAPHY;  Surgeon: Wellington Hampshire, MD;  Location: [facility];  Service: Cardiovascular;  Laterality: Left;   LUMBAR LAMINECTOMY/DECOMPRESSION MICRODISCECTOMY N/A 11/25/2014   Procedure: LUMBAR LAMINECTOMY/DECOMPRESSION MICRODISCECTOMY LUMBAR TWO-THREE, LUMBAR THREE-FOUR ;  Surgeon: Newman Pies, MD;  Location: [facility];  Service: Neurosurgery;  Laterality: N/A;   RIGHT/LEFT HEART CATH AND CORONARY ANGIOGRAPHY Bilateral 10/23/2021   Procedure: RIGHT/LEFT HEART CATH AND CORONARY ANGIOGRAPHY;  Surgeon: Wellington Hampshire, MD;  Location: [facility];  Service: Cardiovascular;  Laterality: Bilateral;   ROTATOR CUFF REPAIR  08/2001   right    Allergies  Allergies  Allergen Reactions   Tegaderm Ag Mesh [Silver] Rash   Amoxicillin Hives and Rash    Did it involve swelling of the face/tongue/throat, SOB, or low BP? No Did  it involve sudden or severe rash/hives, skin peeling, or any reaction on the inside of your mouth or nose? No Did you need to seek medical attention at a hospital or doctor's office? No When did it last happen?  ~2015  If all above answers are "NO", may proceed with cephalosporin use.    Metformin Diarrhea and Nausea And Vomiting   Oxycodone Itching   Tape Other (See Comments)    Caused burning sensation, Tagaderm    Azithromycin Rash and Itching   Clindamycin Hives and Rash    Fluconazole Itching   Simvastatin Other (See Comments)    Back pain    History of Present Illness    76 year old female with above past medical history including chest pain, nonobstructive CAD, chronic HFpEF, hypertension, hyperlipidemia, diabetes, obesity, and sleep apnea.  In the setting of chest pain, she underwent diagnostic catheterization in 2017, which showed minor irregularities in the LAD and otherwise normal coronary arteries.  She had recurrent chest pain in July 2020, and was evaluated with coronary CT angiogram, which suggested significant LAD and OM1 disease based on CT FFR.  Coronary calcium score was 120.  This was followed by diagnostic catheterization, which showed mild to moderate nonobstructive LAD and diagonal disease, with normal LV function.  She was medically managed.  In July 2023, she was seen with report of significant decline in activity tolerance and dyspnea on exertion.  She was not having chest pain.  2D echo showed an EF of 60 to 65% with mild LVH, grade 1 diastolic dysfunction, and elevated RVSP at 47.8 mmHg.  She was placed on Lasix 40 mg daily which resulted in rising creatinine to 2.02 and this was cut back to 20 mg once a day.  At office follow-up on August 31, she continues to experience significant dyspnea on exertion and activity intolerance.  She also reported lower extremity discomfort with activity and ABIs were ordered.  In the setting of progressive dyspnea, she underwent right and left heart cardiac catheterization, revealing stable coronary anatomy with a 40% proximal/30% mid LAD stenosis and 60% stenosis in the lateral first diagonal.  Other arteries were normal.  Right heart catheterization was notable for pulmonary hypertension with a PA 48/21, a wedge of 21, and LVEDP of 27 mmHg.  She was placed on spironolactone 25 mg daily and advised to continue Lasix 20 mg daily.  Since her diagnostic catheterization, she has continued to feel fatigued and  experiences dyspnea with minimal activity.  Her weight has been stable to slightly up.  She does not experience chest pain.  Long discussion with Ms. Kable and her sister today regarding her catheterization findings and her symptoms.  We collectively agreed that deconditioning is playing a large role.  Ms. Lowden says she has significant lower back pain which limits her ability to stand or walk for any period of time.  Also, her legs give out easily (ABIs planned for approximate 2 weeks from now).  She has chronic mild ankle swelling.  She denies palpitations, PND, orthopnea, dizziness, syncope, or early satiety.  Home Medications    Current Outpatient Medications  Medication Sig Dispense Refill   Cholecalciferol (VITAMIN D3) 125 MCG (5000 UT) TABS Take 5,000 Units by mouth daily.      clotrimazole-betamethasone (LOTRISONE) cream Apply 1 application topically 2 (two) times daily as needed (rash).      Cyanocobalamin (VITAMIN B12) 3000 MCG SUBL Take 3,000 mg by mouth daily.     diltiazem (CARDIZEM CD) 120  MG 24 hr capsule Take 1 capsule (120 mg total) by mouth daily. 90 capsule 2   DULoxetine (CYMBALTA) 60 MG capsule Take 60 mg by mouth daily.     furosemide (LASIX) 20 MG tablet Take 1 tablet (20 mg total) by mouth daily. 180 tablet 0   Insulin Degludec (TRESIBA [place]) Inject 60 mg into the skin daily.     insulin lispro (HUMALOG) 100 UNIT/ML injection Inject 24-36 Units into the skin 3 (three) times daily with meals. 24 units before breakfast and lunch and 28 units before supper - plus sliding scale (4 units per 50 over a target of 150)     irbesartan (AVAPRO) 300 MG tablet Take 1 tablet (300 mg total) by mouth daily. 90 tablet 2   meloxicam (MOBIC) 7.5 MG tablet TAKE ONE CAPSULE BY MOUTH DAILY (Patient taking differently: Take 7.5 mg by mouth daily.) 30 tablet 0   pioglitazone (ACTOS) 45 MG tablet Take 22.5 mg by mouth daily.      rosuvastatin (CRESTOR) 40 MG tablet TAKE 1 TABLET(40 MG) BY MOUTH  DAILY (Patient taking differently: Take 40 mg by mouth daily.) 90 tablet 0   spironolactone (ALDACTONE) 25 MG tablet Take 1 tablet (25 mg total) by mouth daily. 30 tablet 3   TRULICITY 1.5 MG/0.5ML SOPN Inject 1.5 mg into the skin once a week.     nitroGLYCERIN (NITROSTAT) 0.4 MG SL tablet Place 1 tablet (0.4 mg total) under the tongue every 5 (five) minutes as needed for chest pain. 25 tablet 3  Aspirin 81 mg daily  Review of Systems    Ongoing fatigue, dyspnea on exertion, and back pain.  Mild lower extremity swelling.  She denies chest pain, palpitations, PND, orthopnea, dizziness, syncope, or early satiety.  All other systems reviewed and are otherwise negative except as noted above.    Physical Exam    VS:  BP 124/60 (BP Location: Right Arm, Patient Position: Sitting, Cuff Size: Normal)   Pulse 60   Ht 5\' 4"  (1.626 m)   Wt 252 lb 12.8 oz (114.7 kg)   SpO2 94%   BMI 43.39 kg/m  , BMI Body mass index is 43.39 kg/m.     GEN: Obese, in no acute distress. HEENT: normal. Neck: Supple, obese, difficult to gauge JVP.  No bruits or masses.   Cardiac: RRR 1/6 systolic murmur at the upper sternal borders.  No rubs or gallops.  No clubbing, cyanosis, edema.  Radials/PT 2+ and equal bilaterally.  Respiratory:  Respirations regular and unlabored, clear to auscultation bilaterally. GI: Obese, soft, nontender, nondistended, BS + x 4. MS: no deformity or atrophy. Skin: warm and dry, no rash. Neuro:  Strength and sensation are intact. Psych: Normal affect.  Accessory Clinical Findings    ECG personally reviewed by me today -regular sinus rhythm, 60, leftward axis- no acute changes.  Lab Results  Component Value Date   WBC 5.3 10/12/2021   HGB 12.5 10/12/2021   HCT 39.4 10/12/2021   MCV 95.9 10/12/2021   PLT 236 10/12/2021   Lab Results  Component Value Date   CREATININE 1.62 (H) 10/31/2021   BUN 42 (H) 10/31/2021   NA 137 10/31/2021   K 4.5 10/31/2021   CL 102 10/31/2021   CO2  24 10/31/2021   Lab Results  Component Value Date   ALT 23 10/07/2013   AST 28 10/07/2013   ALKPHOS 80 10/07/2013   BILITOT 0.7 10/07/2013   Lab Results  Component Value Date  CHOL 125 10/07/2013   HDL 33.90 (L) 10/07/2013   LDLCALC 71 10/07/2013   TRIG 103.0 10/07/2013   CHOLHDL 4 10/07/2013    Lab Results  Component Value Date   HGBA1C 8.7 (H) 10/07/2013    Assessment & Plan    1.  Chronic HFpEF/dyspnea on exertion: Progressive dyspnea on exertion over the past year in the setting of inactivity and chronic back pain.  Echo earlier this summer showed an EF of 60 to 65% with grade 1 diastolic dysfunction, normal RV function, and RVSP of 47.8 mmHg.  She was initially placed on Lasix 40 mg daily with subsequent rise in BUN and creatinine (31/2.02), prompting reduction of dose to 20 mg daily.  She subsequently underwent a diagnostic catheterization in early September due to ongoing symptoms which showed stable coronary anatomy with a 40% proximal/30% mid LAD stenosis and 60% stenosis in the lateral first diagonal.  Other arteries were normal.  Right heart catheterization was notable for pulmonary hypertension with a PA 48/21, a wedge of 21, and LVEDP of 27 mmHg.  She was placed on spironolactone 25 mg daily and advised to continue Lasix 20 mg daily.  Since catheterization, she has continued to have fatigue and dyspnea on exertion.  She appears to be relatively euvolemic on exam though exam is challenging secondary to body habitus.  I did follow-up a basic metabolic panel today and BUN and creatinine are slightly higher at 42 and 1.62.  Potassium is normal I am going to continue current dose of Lasix and spironolactone and plan to follow-up basic metabolic panel again in 2 weeks.  With slight rise in BUN and creatinine, will hold off on addition of SGLT2 inhibitor at this time.  She is already on a GLP-1 agonist and notes that she was previously on Ozempic, though she did not think she lost any  significant weight went on it but ultimately could not afford it.  2.  Coronary artery disease: Recent diagnostic catheterization with stable, moderate, nonobstructive disease, similar to 2020 catheterization.  She has not been experiencing chest pain.  She remains on aspirin and statin therapy.  3.  Essential hypertension: Well-controlled today on diltiazem, Lasix, and spironolactone.  4.  Hyperlipidemia: LDL 56 earlier this year.  Continue statin therapy.  5.  Obstructive sleep apnea: Compliant with CPAP with recent adjustments made to her settings.  She is followed by primary care for this.  6.  Morbid obesity/deconditioning: Long discussion related to her obesity and deconditioning today.  Though elevated right heart pressures are certainly playing a role in her symptoms, her symptoms do appear to be out of proportion to her filling pressures.  Strongly encouraged her to touch base with a dietitian to establish acceptable dietary and weight loss goals in the setting of her diabetes, and to also begin to look for opportunities to be more active even if exercising while sedentary.  She previously had access to a pool but does not at the time.  She would likely benefit from water aerobics given joint limitations and back pain.  I provided her handouts related to a whole food plant-based diet and again encouraged her to talk with her dietitian.  7.  Lower extremity weakness/possible claudication: She reports that she has chronic hip and knee pain with lower extremity weakness and difficulty standing at times.  Previously ordered arterial duplex/ABIs and these will be performed in approximately 2 weeks.  8.  Stage III chronic kidney disease: As above, BUN and creatinine slightly  higher today at 42 and 1.62 respectively.  Continue current dose of diuretics and follow-up in 2 weeks.  9.  Type 2 diabetes mellitus: Followed by primary care.  On Trulicity, Avapro, and Actos.    10.  Disposition:  Follow-up basic metabolic panel in 2 weeks.  Follow-up in clinic in 4 to 6 weeks.  Nicolasa Ducking, NP 10/31/2021, 4:16 PM

## 2021-11-01 ENCOUNTER — Other Ambulatory Visit: Payer: Self-pay | Admitting: *Deleted

## 2021-11-01 DIAGNOSIS — I2510 Atherosclerotic heart disease of native coronary artery without angina pectoris: Secondary | ICD-10-CM

## 2021-11-01 DIAGNOSIS — N183 Chronic kidney disease, stage 3 unspecified: Secondary | ICD-10-CM

## 2021-11-01 DIAGNOSIS — I5032 Chronic diastolic (congestive) heart failure: Secondary | ICD-10-CM

## 2021-11-03 NOTE — Addendum Note (Signed)
Addended by: James Ivanoff D on: 11/03/2021 11:59 AM   Modules accepted: Orders

## 2021-11-14 ENCOUNTER — Other Ambulatory Visit: Payer: Self-pay | Admitting: Nurse Practitioner

## 2021-11-14 ENCOUNTER — Ambulatory Visit: Payer: Medicare Other | Attending: Nurse Practitioner

## 2021-11-14 ENCOUNTER — Other Ambulatory Visit
Admission: RE | Admit: 2021-11-14 | Discharge: 2021-11-14 | Disposition: A | Discharge status: Home / Self Care | Payer: Medicare Other | Attending: Physician Assistant | Admitting: Physician Assistant

## 2021-11-14 DIAGNOSIS — N183 Chronic kidney disease, stage 3 unspecified: Secondary | ICD-10-CM | POA: Diagnosis present

## 2021-11-14 DIAGNOSIS — I2510 Atherosclerotic heart disease of native coronary artery without angina pectoris: Secondary | ICD-10-CM | POA: Diagnosis present

## 2021-11-14 DIAGNOSIS — I5032 Chronic diastolic (congestive) heart failure: Secondary | ICD-10-CM | POA: Insufficient documentation

## 2021-11-14 DIAGNOSIS — M79651 Pain in right thigh: Secondary | ICD-10-CM

## 2021-11-14 DIAGNOSIS — R29898 Other symptoms and signs involving the musculoskeletal system: Secondary | ICD-10-CM | POA: Insufficient documentation

## 2021-11-14 DIAGNOSIS — M79652 Pain in left thigh: Secondary | ICD-10-CM | POA: Diagnosis not present

## 2021-11-14 LAB — BASIC METABOLIC PANEL
Anion gap: 8 (ref 5–15)
BUN: 46 mg/dL — ABNORMAL HIGH (ref 8–23)
CO2: 24 mmol/L (ref 22–32)
Calcium: 9.5 mg/dL (ref 8.9–10.3)
Chloride: 106 mmol/L (ref 98–111)
Creatinine, Ser: 1.81 mg/dL — ABNORMAL HIGH (ref 0.44–1.00)
GFR, Estimated: 29 mL/min — ABNORMAL LOW (ref >60)
Glucose, Bld: 78 mg/dL (ref 70–99)
Potassium: 4.3 mmol/L (ref 3.5–5.1)
Sodium: 138 mmol/L (ref 135–145)

## 2021-11-17 ENCOUNTER — Telehealth: Payer: Self-pay | Admitting: *Deleted

## 2021-11-17 DIAGNOSIS — I5032 Chronic diastolic (congestive) heart failure: Secondary | ICD-10-CM

## 2021-11-17 DIAGNOSIS — I2510 Atherosclerotic heart disease of native coronary artery without angina pectoris: Secondary | ICD-10-CM

## 2021-11-17 DIAGNOSIS — N183 Chronic kidney disease, stage 3 unspecified: Secondary | ICD-10-CM

## 2021-11-17 MED ORDER — FUROSEMIDE 20 MG PO TABS: 20.0000 mg | ORAL_TABLET | Freq: Every day | ORAL | 1 refills | Status: DC | PRN

## 2021-11-17 NOTE — Telephone Encounter (Signed)
-----   Message from Theora Gianotti, NP sent at 11/16/2021  6:39 PM EDT ----- Kidney function slightly more abnormal than at last check.  Recommend using Lasix 20 mg daily as needed only for weight gain of 2 pounds in 24 hours or 5 pounds over the course of the week.  Follow-up basic metabolic panel in 1 week.

## 2021-11-17 NOTE — Telephone Encounter (Signed)
I don't think she is taking metolazone.  She was placed on spironolactone at the time of her cath.  She should continue spironolactone daily.

## 2021-11-17 NOTE — Telephone Encounter (Signed)
Reviewed results and recommendations with patient. She verbalized understanding and inquired if she should continue her metolazone. I do not see this on her medication list but she stated that Dr. Fletcher Anon prescribed this after her heart cath and was sent to her pharmacy. She was not sure of the mg but she is taking that daily. Advised I would update provider on this and would give her a call back. She verbalized understanding with no further questions. Will send to provider for his review.

## 2021-11-17 NOTE — Telephone Encounter (Signed)
Spoke with patient and reviewed that medication she was referring to was the spironolactone and that she should continue that medication. She verbalized understanding with no further questions at this time.

## 2021-12-01 ENCOUNTER — Other Ambulatory Visit
Admission: RE | Admit: 2021-12-01 | Discharge: 2021-12-01 | Disposition: A | Discharge status: Home / Self Care | Payer: Medicare Other | Attending: Nurse Practitioner | Admitting: Nurse Practitioner

## 2021-12-01 DIAGNOSIS — I5032 Chronic diastolic (congestive) heart failure: Secondary | ICD-10-CM | POA: Diagnosis present

## 2021-12-01 DIAGNOSIS — N183 Chronic kidney disease, stage 3 unspecified: Secondary | ICD-10-CM

## 2021-12-01 DIAGNOSIS — I2510 Atherosclerotic heart disease of native coronary artery without angina pectoris: Secondary | ICD-10-CM | POA: Insufficient documentation

## 2021-12-01 LAB — BASIC METABOLIC PANEL
Anion gap: 5 (ref 5–15)
BUN: 34 mg/dL — ABNORMAL HIGH (ref 8–23)
CO2: 25 mmol/L (ref 22–32)
Calcium: 9.3 mg/dL (ref 8.9–10.3)
Chloride: 109 mmol/L (ref 98–111)
Creatinine, Ser: 1.53 mg/dL — ABNORMAL HIGH (ref 0.44–1.00)
GFR, Estimated: 35 mL/min — ABNORMAL LOW (ref >60)
Glucose, Bld: 115 mg/dL — ABNORMAL HIGH (ref 70–99)
Potassium: 4.7 mmol/L (ref 3.5–5.1)
Sodium: 139 mmol/L (ref 135–145)

## 2021-12-15 ENCOUNTER — Encounter: Payer: Self-pay | Admitting: Cardiovascular Disease

## 2021-12-15 ENCOUNTER — Ambulatory Visit: Payer: Medicare Other | Attending: Cardiovascular Disease | Admitting: Cardiovascular Disease

## 2021-12-15 VITALS — BP 120/58 | HR 74 | Ht 64.0 in | Wt 253.1 lb

## 2021-12-15 DIAGNOSIS — I10 Essential (primary) hypertension: Secondary | ICD-10-CM | POA: Diagnosis not present

## 2021-12-15 DIAGNOSIS — I5032 Chronic diastolic (congestive) heart failure: Secondary | ICD-10-CM | POA: Diagnosis not present

## 2021-12-15 DIAGNOSIS — I200 Unstable angina: Secondary | ICD-10-CM

## 2021-12-15 DIAGNOSIS — I2510 Atherosclerotic heart disease of native coronary artery without angina pectoris: Secondary | ICD-10-CM | POA: Diagnosis not present

## 2021-12-15 DIAGNOSIS — I739 Peripheral vascular disease, unspecified: Secondary | ICD-10-CM | POA: Diagnosis not present

## 2021-12-15 NOTE — Patient Instructions (Signed)

## 2021-12-15 NOTE — Progress Notes (Signed)
Cardiology Office Note   Date:  12/15/2021   ID:  Monica Ray, Monica Ray 1945-02-11, MRN [id number]  PCP:  Monica Paradise, MD  Cardiologist:   Lorine Bears, MD   Chief Complaint  Patient presents with   Other    6 wk f/u pt would like to discuss lab work. Meds reviewed verbally with pt.       History of Present Illness: Monica Ray is a 76 y.o. female who presents for a follow-up visit regarding chronic diastolic heart failure and nonobstructive coronary artery disease. She has multiple chronic medical conditions that include prolonged history of type 2 diabetes , essential hypertension, sleep apnea with poor intolerance to CPAP, hyperlipidemia and obesity. She is a lifelong nonsmoker. There is no family history of coronary artery disease but there is family history of cancer. She had previous back surgery. Previous  right and left cardiac catheterization in November 2017 showed normal LV systolic function with mildly elevated left ventricular end-diastolic pressure and mild pulmonary hypertension. There was mild nonobstructive coronary artery disease.   Pulmonary pressure was 37/17 with a mean of 25 mmHg. LVEDP was 18 mmHg.  She had recurrent chest pain in July 2020 that was evaluated by CTA of the coronary arteries.which showed moderate LAD disease which was significant by FFR.  Coronary calcium score was 120.  Due to that, a left heart catheterization was done in August 2020 which showed 40% proximal LAD disease and 50% ostial first diagonal disease.  Ejection fraction was normal with moderately elevated left ventricular end-diastolic pressure in the setting of moderately elevated blood pressure.  She was seen recently for worsening exertional dyspnea and fatigue.  An echocardiogram was done in August which showed normal LV systolic function, moderate pulmonary hypertension and no significant valvular abnormalities.  Due to her symptoms and pulmonary hypertension on echo, she  underwent cardiac catheterization in September which showed stable moderate one-vessel coronary artery disease involving the LAD.  Right heart catheterization showed moderately elevated right and left-sided filling pressures, mild pulmonary hypertension and high cardiac output.  She was placed on spironolactone. Due to low back pain, arterial Doppler studies were done which showed normal ABI bilaterally with some abnormal waveforms in the common femoral artery.  Duplex showed borderline disease in the iliac arteries bilaterally.  She does not do much physical activities at all.  She is significantly limited by her low back pain that happens even after standing for a minute.  Past Medical History:  Diagnosis Date   Anemia    before hysterectomy   Anginal pain (HCC)    Chronic heart failure with preserved ejection fraction (HFpEF) (HCC)    a. 09/2021 Echo: EF 60-65%, no rwma, GrI DD, nl RV fxn, RVSP 47.89mmHg. Mildly dil LA; b. 10/2021 RHC: RA 15, RV 50/9, PA 48/21 (32), PCWP 21, LVEDP 27.   Depression    Diabetes mellitus, type 2 (HCC)    DM retinopathy (HCC)    Dyspnea    Edema    GERD (gastroesophageal reflux disease)    Hx of phlebitis    Hyperlipidemia    Hypertension    Mild allergic rhinitis    MRSA (methicillin resistant staph aureus) culture positive 03/2006   Nonobstructive CAD (coronary artery disease)    a. 12/2015 Cath: LAD min irregs, otw nl cors; b. 08/2018 Cor CTA: Mid/Distal LAD and OM1 dzs w/ abnl ctFFR; c. 09/2018 Cath: LM nl, LAD 40p, 67m, D1 50, LCX small-nl, RCA nl, EF  55-65%; c. 10/2021 Cath: LM nl, LAD 40p/51m, lat D1 60, LCX sm nl, OM1 nl, RCA large, nl, RPDA/PAV/PL1-3 nl.   OA (osteoarthritis)    hands   Sleep apnea    CPAP    Past Surgical History:  Procedure Laterality Date   ABDOMINAL HYSTERECTOMY     ADENOIDECTOMY     APPENDECTOMY  1989   barium enema  1998   neg CT   CARDIAC CATHETERIZATION Bilateral 12/08/2015   Procedure: Right/Left Heart Cath and  Coronary Angiography;  Surgeon: Iran Ouch, MD;  Location: [facility];  Service: Cardiovascular;  Laterality: Bilateral;   CATARACT EXTRACTION W/PHACO Left 05/12/2019   Procedure: CATARACT EXTRACTION PHACO AND INTRAOCULAR LENS PLACEMENT (IOC) LEFT VISION BLUE;  Surgeon: Elliot Cousin, MD;  Location: [facility];  Service: Ophthalmology;  Laterality: Left;  12.80 1:17.1   CATARACT EXTRACTION W/PHACO Right 06/11/2019   Procedure: CATARACT EXTRACTION PHACO AND INTRAOCULAR LENS PLACEMENT (IOC) RIGHT DIABETIC VISION BLUE 12.91  01:21.2;  Surgeon: Elliot Cousin, MD;  Location: [facility];  Service: Ophthalmology;  Laterality: Right;  Diabetic - insulin and oral meds   CHOLECYSTECTOMY  1989   LEFT HEART CATH AND CORONARY ANGIOGRAPHY Left 09/15/2018   Procedure: LEFT HEART CATH AND CORONARY ANGIOGRAPHY;  Surgeon: Iran Ouch, MD;  Location: [facility];  Service: Cardiovascular;  Laterality: Left;   LUMBAR LAMINECTOMY/DECOMPRESSION MICRODISCECTOMY N/A 11/25/2014   Procedure: LUMBAR LAMINECTOMY/DECOMPRESSION MICRODISCECTOMY LUMBAR TWO-THREE, LUMBAR THREE-FOUR ;  Surgeon: Tressie Stalker, MD;  Location: [facility];  Service: Neurosurgery;  Laterality: N/A;   RIGHT/LEFT HEART CATH AND CORONARY ANGIOGRAPHY Bilateral 10/23/2021   Procedure: RIGHT/LEFT HEART CATH AND CORONARY ANGIOGRAPHY;  Surgeon: Iran Ouch, MD;  Location: [facility];  Service: Cardiovascular;  Laterality: Bilateral;   ROTATOR CUFF REPAIR  08/2001   right     Current Outpatient Medications  Medication Sig Dispense Refill   CALCIUM PO Take by mouth daily in the afternoon.     Cholecalciferol (VITAMIN D3) 125 MCG (5000 UT) TABS Take 5,000 Units by mouth daily.      clotrimazole-betamethasone (LOTRISONE) cream Apply 1 application topically 2 (two) times daily as needed (rash).      Cyanocobalamin (VITAMIN B12) 3000 MCG SUBL Take 3,000 mg by mouth daily.     diltiazem (CARDIZEM CD)  120 MG 24 hr capsule Take 1 capsule (120 mg total) by mouth daily. 90 capsule 2   DULoxetine (CYMBALTA) 60 MG capsule Take 60 mg by mouth daily.     furosemide (LASIX) 20 MG tablet Take 1 tablet (20 mg total) by mouth daily as needed (As needed for weight gain of 2 pounds in 24 hours or 5 pounds over the course of the week.). 90 tablet 1   Insulin Degludec (TRESIBA [place]) Inject 60 mg into the skin daily.     insulin lispro (HUMALOG) 100 UNIT/ML injection Inject 24-36 Units into the skin 3 (three) times daily with meals. 24 units before breakfast and lunch and 28 units before supper - plus sliding scale (4 units per 50 over a target of 150)     meloxicam (MOBIC) 7.5 MG tablet TAKE ONE CAPSULE BY MOUTH DAILY (Patient taking differently: Take 7.5 mg by mouth daily.) 30 tablet 0   nitroGLYCERIN (NITROSTAT) 0.4 MG SL tablet Place 1 tablet (0.4 mg total) under the tongue every 5 (five) minutes as needed for chest pain. 25 tablet 3   pioglitazone (ACTOS) 45 MG tablet Take 22.5  mg by mouth daily.      rosuvastatin (CRESTOR) 40 MG tablet TAKE 1 TABLET(40 MG) BY MOUTH DAILY (Patient taking differently: Take 40 mg by mouth daily.) 90 tablet 0   spironolactone (ALDACTONE) 25 MG tablet Take 1 tablet (25 mg total) by mouth daily. 30 tablet 3   TRULICITY 1.5 MG/0.5ML SOPN Inject 1.5 mg into the skin once a week.     irbesartan (AVAPRO) 300 MG tablet Take 1 tablet (300 mg total) by mouth daily. 90 tablet 2   No current facility-administered medications for this visit.    Allergies:   Tegaderm ag mesh [silver], Amoxicillin, Metformin, Oxycodone, Tape, Azithromycin, Clindamycin, Fluconazole, and Simvastatin    Social History:  The patient  reports that she has never smoked. She has never used smokeless tobacco. She reports that she does not drink alcohol and does not use drugs.   Family History:  The patient's family history includes Alcohol abuse in her father and mother; Aneurysm in her father and paternal  grandmother; Breast cancer in her paternal aunt; Cancer in her father and sister; Cancer (age of onset: 56) in her mother; Diabetes in her maternal grandmother and mother.    ROS:  Please see the history of present illness.   Otherwise, review of systems are positive for none.   All other systems are reviewed and negative.    PHYSICAL EXAM: VS:  BP (!) 120/58 (BP Location: Left Arm, Patient Position: Sitting, Cuff Size: Large)   Pulse 74   Ht 5\' 4"  (1.626 m)   Wt 253 lb 2 oz (114.8 kg)   SpO2 97%   BMI 43.45 kg/m  , BMI Body mass index is 43.45 kg/m. GEN: Well nourished, well developed, in no acute distress  HEENT: normal  Neck: no JVD, carotid bruits, or masses Cardiac: RRR; no murmurs, rubs, or gallops,no edema  Respiratory:  clear to auscultation bilaterally, normal work of breathing GI: soft, nontender, nondistended, + BS MS: no deformity or atrophy  Skin: warm and dry, no rash Neuro:  Strength and sensation are intact Psych: euthymic mood, full affect Distal pulses are palpable.   EKG:  EKG is not ordered today.    Recent Labs: 10/12/2021: Hemoglobin 12.5; Platelets 236 12/01/2021: BUN 34; Creatinine, Ser 1.53; Potassium 4.7; Sodium 139    Lipid Panel    Component Value Date/Time   CHOL 125 10/07/2013 0812   TRIG 103.0 10/07/2013 0812   HDL 33.90 (L) 10/07/2013 0812   CHOLHDL 4 10/07/2013 0812   VLDL 20.6 10/07/2013 0812   LDLCALC 71 10/07/2013 0812      Wt Readings from Last 3 Encounters:  12/15/21 253 lb 2 oz (114.8 kg)  10/31/21 252 lb 12.8 oz (114.7 kg)  10/23/21 250 lb (113.4 kg)          11/22/2015    2:32 PM  PAD Screen  Previous PAD dx? No  Previous surgical procedure? No  Pain with walking? No  Feet/toe relief with dangling? No  Painful, non-healing ulcers? No  Extremities discolored? No      ASSESSMENT AND PLAN:  1.   Chronic diastolic heart failure: She appears to be euvolemic on spironolactone 25 mg once daily.  In addition, she  is now using furosemide as needed based on her weight trend.  She was on higher dose of furosemide but that was associated with the worsening renal function.    2.  Coronary artery disease with other forms of angina: She has moderate one-vessel coronary  artery disease which should not be responsible for her symptoms.  Recommend continuing medical therapy.  3. Essential hypertension: Blood pressure is controlled on current medications.   4. Hyperlipidemia: I reviewed most recent lipid profile done in July which showed an LDL of 56.  Continue treatment with rosuvastatin 40 mg daily.  Her LDL is at target.  5.  Physical deconditioning: I suspect this is responsible for the majority of her symptoms.  6.  Peripheral arterial disease: Duplex showed borderline disease in her iliac arteries.  However, I do not think she has obstructive disease.  Her distal pulses are palpable and I reviewed the study.  The velocities in the iliac arteries were only borderline elevated below 300.   Disposition:   FU in 6 months.  Signed,  Lorine Bears, MD  12/15/2021 4:46 PM    [facility]

## 2022-02-28 ENCOUNTER — Other Ambulatory Visit: Payer: Self-pay

## 2022-02-28 ENCOUNTER — Other Ambulatory Visit: Payer: Self-pay | Admitting: Physician Assistant

## 2022-02-28 DIAGNOSIS — Z1231 Encounter for screening mammogram for malignant neoplasm of breast: Secondary | ICD-10-CM

## 2022-03-06 ENCOUNTER — Other Ambulatory Visit: Payer: Self-pay | Admitting: Family Medicine

## 2022-03-06 DIAGNOSIS — M5412 Radiculopathy, cervical region: Secondary | ICD-10-CM

## 2022-03-06 DIAGNOSIS — M5416 Radiculopathy, lumbar region: Secondary | ICD-10-CM

## 2022-03-19 ENCOUNTER — Ambulatory Visit
Admission: RE | Admit: 2022-03-19 | Discharge: 2022-03-19 | Disposition: A | Discharge status: Home / Self Care | Payer: Medicare Other | Source: Ambulatory Visit | Attending: Family Medicine | Admitting: Family Medicine

## 2022-03-19 DIAGNOSIS — M5416 Radiculopathy, lumbar region: Secondary | ICD-10-CM

## 2022-03-19 DIAGNOSIS — M5412 Radiculopathy, cervical region: Secondary | ICD-10-CM

## 2022-04-04 ENCOUNTER — Other Ambulatory Visit: Payer: Self-pay

## 2022-04-04 ENCOUNTER — Ambulatory Visit
Admission: RE | Admit: 2022-04-04 | Discharge: 2022-04-04 | Disposition: A | Discharge status: Home / Self Care | Payer: Self-pay | Source: Ambulatory Visit | Attending: Neurosurgery | Admitting: Neurosurgery

## 2022-04-04 DIAGNOSIS — Z049 Encounter for examination and observation for unspecified reason: Secondary | ICD-10-CM

## 2022-04-10 ENCOUNTER — Ambulatory Visit
Admission: RE | Admit: 2022-04-10 | Discharge: 2022-04-10 | Disposition: A | Discharge status: Home / Self Care | Payer: Medicare Other | Source: Ambulatory Visit | Attending: Physician Assistant | Admitting: Physician Assistant

## 2022-04-10 DIAGNOSIS — Z1231 Encounter for screening mammogram for malignant neoplasm of breast: Secondary | ICD-10-CM | POA: Diagnosis present

## 2022-04-18 NOTE — H&P (View-Only) (Signed)
  Referring Physician:  Meeler, Whitney L, FNP [address] [place],  [place] [id number]  Primary Physician:  Ray, Monica K, MD  History of Present Illness: 04/19/2022 [name] is here today with a chief complaint of back and leg pain.  She has been having pain for approximately 2 years that are now limiting her ability to walk.  She has significant back and posterior buttock pain that occurs when she stands and tries to walk.  Standing is also problematic for her.  Her pain improves when she sits down.  Most of her pain is between her hip and her knees.  This is now limiting her day-to-day activities by substantial amount.  She also reports some problems with shortness of breath that has been worked up to identify whether there is a proximate cause.  She has some heart failure that is being managed currently.  She does report some issues with balance.  She she has some hand numbness that has been impacting her.  Bowel/Bladder Dysfunction: none  Conservative measures:  Physical therapy: denies has not participated Multimodal medical therapy including regular antiinflammatories:  meloxicam, CBD cream,tramadol  Injections:  has not received cervical epidural steroid injections 10/13/2014: Left L4-5 transforaminal ESI 09/22/2014: Left L4-5 transforaminal ESI    Past Surgery:  lumbar decompression in 2016 by Dr Jenkins  Melah W Mcmanamon has symptoms of cervical myelopathy.  The symptoms are causing a significant impact on the patient's life.   I have utilized the care everywhere function in epic to review the outside records available from external health systems.  Review of Systems:  A 10 point review of systems is negative, except for the pertinent positives and negatives detailed in the HPI.  Past Medical History: Past Medical History:  Diagnosis Date   Anemia    before hysterectomy   Anginal pain (HCC)    Chronic heart failure with preserved ejection  fraction (HFpEF) (HCC)    a. 09/2021 Echo: EF 60-65%, no rwma, GrI DD, nl RV fxn, RVSP 47.8mmHg. Mildly dil LA; b. 10/2021 RHC: RA 15, RV 50/9, PA 48/21 (32), PCWP 21, LVEDP 27.   Depression    Diabetes mellitus, type 2 (HCC)    DM retinopathy (HCC)    Dyspnea    Edema    GERD (gastroesophageal reflux disease)    Hx of phlebitis    Hyperlipidemia    Hypertension    Mild allergic rhinitis    MRSA (methicillin resistant staph aureus) culture positive 03/2006   Nonobstructive CAD (coronary artery disease)    a. 12/2015 Cath: LAD min irregs, otw nl cors; b. 08/2018 Cor CTA: Mid/Distal LAD and OM1 dzs w/ abnl ctFFR; c. 09/2018 Cath: LM nl, LAD 40p, 30m, D1 50, LCX small-nl, RCA nl, EF 55-65%; c. 10/2021 Cath: LM nl, LAD 40p/30m, lat D1 60, LCX sm nl, OM1 nl, RCA large, nl, RPDA/PAV/PL1-3 nl.   OA (osteoarthritis)    hands   Sleep apnea    CPAP    Past Surgical History: Past Surgical History:  Procedure Laterality Date   ABDOMINAL HYSTERECTOMY     ADENOIDECTOMY     APPENDECTOMY  1989   barium enema  1998   neg CT   CARDIAC CATHETERIZATION Bilateral 12/08/2015   Procedure: Right/Left Heart Cath and Coronary Angiography;  Surgeon: Muhammad A Arida, MD;  Location: [facility];  Service: Cardiovascular;  Laterality: Bilateral;   CATARACT EXTRACTION W/PHACO Left 05/12/2019   Procedure: CATARACT EXTRACTION PHACO AND INTRAOCULAR   LENS PLACEMENT (IOC) LEFT VISION BLUE;  Surgeon: Harrow, Brian, MD;  Location: [facility];  Service: Ophthalmology;  Laterality: Left;  12.80 1:17.1   CATARACT EXTRACTION W/PHACO Right 06/11/2019   Procedure: CATARACT EXTRACTION PHACO AND INTRAOCULAR LENS PLACEMENT (IOC) RIGHT DIABETIC VISION BLUE 12.91  01:21.2;  Surgeon: Harrow, Brian, MD;  Location: [facility];  Service: Ophthalmology;  Laterality: Right;  Diabetic - insulin and oral meds   CHOLECYSTECTOMY  1989   LEFT HEART CATH AND CORONARY ANGIOGRAPHY Left 09/15/2018   Procedure: LEFT HEART CATH  AND CORONARY ANGIOGRAPHY;  Surgeon: Arida, Muhammad A, MD;  Location: [facility];  Service: Cardiovascular;  Laterality: Left;   LUMBAR LAMINECTOMY/DECOMPRESSION MICRODISCECTOMY N/A 11/25/2014   Procedure: LUMBAR LAMINECTOMY/DECOMPRESSION MICRODISCECTOMY LUMBAR TWO-THREE, LUMBAR THREE-FOUR ;  Surgeon: Jeffrey Jenkins, MD;  Location: [facility];  Service: Neurosurgery;  Laterality: N/A;   RIGHT/LEFT HEART CATH AND CORONARY ANGIOGRAPHY Bilateral 10/23/2021   Procedure: RIGHT/LEFT HEART CATH AND CORONARY ANGIOGRAPHY;  Surgeon: Arida, Muhammad A, MD;  Location: [facility];  Service: Cardiovascular;  Laterality: Bilateral;   ROTATOR CUFF REPAIR  08/2001   right    Allergies: Allergies as of 04/19/2022 - Review Complete 12/15/2021  Allergen Reaction Noted   Tegaderm ag mesh [silver] Rash 12/15/2015   Amoxicillin Hives and Rash 07/14/2013   Metformin Diarrhea and Nausea And Vomiting 11/16/2014   Oxycodone Itching 09/11/2018   Tape Other (See Comments) 09/11/2018   Azithromycin Rash and Itching 11/16/2014   Clindamycin Hives and Rash 11/16/2014   Fluconazole Itching    Simvastatin Other (See Comments) 11/16/2014    Medications: Current Meds  Medication Sig   OZEMPIC, 2 MG/DOSE, 8 MG/3ML SOPN INJECT 2 MG UNDER THE SKIN ONCE A WEEK   TOUJEO MAX SOLOSTAR 300 UNIT/ML Solostar Pen Inject into the skin.    Social History: Social History   Tobacco Use   Smoking status: Never   Smokeless tobacco: Never  Vaping Use   Vaping Use: Never used  Substance Use Topics   Alcohol use: No   Drug use: No    Family Medical History: Family History  Problem Relation Age of Onset   Cancer Mother 85       lung   Diabetes Mother    Alcohol abuse Mother    Alcohol abuse Father    Cancer Father        throat   Aneurysm Father    Diabetes Maternal Grandmother    Aneurysm Paternal Grandmother    Breast cancer Paternal Aunt        70's   Cancer Sister     Physical  Examination: Vitals:   04/19/22 1418  BP: 128/76    General: Patient is well developed, well nourished, calm, collected, and in no apparent distress. Attention to examination is appropriate.  Neck:   Supple.  Full range of motion.  Respiratory: Patient is breathing without any difficulty.   NEUROLOGICAL:     Awake, alert, oriented to person, place, and time.  Speech is clear and fluent.   Cranial Nerves: Pupils equal round and reactive to light.  Facial tone is symmetric.  Facial sensation is symmetric. Shoulder shrug is symmetric. Tongue protrusion is midline.  There is no pronator drift.  ROM of spine: full.    Strength: Side Biceps Triceps Deltoid Interossei Grip Wrist Ext. Wrist Flex.  R 5 5 4 4 4 5 5  L 5 5 4 4 4 5 5   Side Iliopsoas   Quads Hamstring PF DF EHL  R 5 5 5 5 5 5  L 5 5 5 5 5 5   Reflexes are 3+ and symmetric at the biceps, triceps, brachioradialis, patella and achilles.   Hoffman's is present.  She has a suprapatellar reflex bilaterally.  Bilateral upper and lower extremity sensation is symmetric to light touch.   She has some diminished light touch sensation in her distal forearms. No evidence of dysmetria noted.  Gait is abnormal and wide-based.  She has moderate difficulty with tandem walk.     Medical Decision Making  Imaging: MRI CL spine 03/19/2022 IMPRESSION: 1. Progressive multilevel cervical spondylosis, most pronounced at the C4-5 and C5-6 levels where there is severe canal stenosis. Mild cord compression at C4-5. 2. Severe right and moderate left foraminal stenosis at C5-6.     Electronically Signed   By: Nicholas  Plundo D.O.   On: 03/20/2022 09:54  IMPRESSION: 1. Transitional anatomy, as above. 2. Multilevel degenerative changes of the lumbar spine, progressed at L1-L2 where there is severe canal stenosis with severe left and moderate right foraminal stenosis. 3. Severe right and moderate-severe left foraminal stenosis  at L2-L3. 4. Moderate-severe bilateral foraminal stenosis at L3-L4.     Electronically Signed   By: Nicholas  Plundo D.O.   On: 03/20/2022 10:06   I have personally reviewed the images and agree with the above interpretation.  Assessment and Plan: Ms. Hjort is a pleasant 76 y.o. 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  She has clear clinical symptoms of this including elevated reflexes and difficulty with walking.  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  Due to her symptoms from cervical myelopathy, I recommended C4-6 anterior cervical discectomy and fusion.  There is no role for conservative management in this condition.  I discussed the planned procedure at length with the patient, including the risks, benefits, alternatives, and indications. The risks discussed include but are not limited to bleeding, infection, need for reoperation, spinal fluid leak, stroke, vision loss, anesthetic complication, coma, paralysis, and even death. We also discussed the possibility of post-operative dysphagia, vocal cord paralysis, and the risk of adjacent segment disease in the future. I also described in detail that improvement was not guaranteed.  The patient expressed understanding of these risks, and asked that we proceed with surgery. I described the surgery in layman's terms, and gave ample opportunity for questions, which were answered to the best of my ability.   Her back pain is a more complex issue.  To fully address her change in posture in addition to her lumbar stenosis, a substantial scoliosis operation would be indicated.  That being said, I do not feel she is an appropriate surgical candidate for that level surgery.  1 could also consider a more limited approach to addressing L1-4.  The final option would be considering spinal cord stimulation.  Have given her information on spinal cord stimulation.  After  she recovers from her cervical surgery, we will make final plans regarding the next steps for her back.  She will need clearance from her cardiologist before pursuing surgical intervention.    Thank you for involving me in the care of this patient.      Webster Patrone K. Jury Caserta MD, MPHS Neurosurgery  

## 2022-04-18 NOTE — Progress Notes (Addendum)
Referring Physician:  Harvest Dark, [place],  [place] [address]  Primary Physician:  Marinda Elk, MD  History of Present Illness: 04/19/2022 [name] is here today with a chief complaint of back and leg pain.  She has been having pain for approximately 2 years that are now limiting her ability to walk.  She has significant back and posterior buttock pain that occurs when she stands and tries to walk.  Standing is also problematic for her.  Her pain improves when she sits down.  Most of her pain is between her hip and her knees.  This is now limiting her day-to-day activities by substantial amount.  She also reports some problems with shortness of breath that has been worked up to identify whether there is a proximate cause.  She has some heart failure that is being managed currently.  She does report some issues with balance.  She she has some hand numbness that has been impacting her.  Bowel/Bladder Dysfunction: none  Conservative measures:  Physical therapy: denies has not participated Multimodal medical therapy including regular antiinflammatories:  meloxicam, CBD cream,tramadol  Injections:  has not received cervical epidural steroid injections 10/13/2014: Left L4-5 transforaminal ESI 09/22/2014: Left L4-5 transforaminal ESI    Past Surgery:  lumbar decompression in 2016 by Dr Baltazar Apo has symptoms of cervical myelopathy.  The symptoms are causing a significant impact on the patient's life.   I have utilized the care everywhere function in epic to review the outside records available from external health systems.  Review of Systems:  A 10 point review of systems is negative, except for the pertinent positives and negatives detailed in the HPI.  Past Medical History: Past Medical History:  Diagnosis Date   Anemia    before hysterectomy   Anginal pain (HCC)    Chronic heart failure with preserved ejection  fraction (HFpEF) ([place])    a. 09/2021 Echo: EF 60-65%, no rwma, GrI DD, nl RV fxn, RVSP 47.33mmHg. Mildly dil LA; b. 10/2021 RHC: RA 15, RV 50/9, PA 48/21 (32), PCWP 21, LVEDP 27.   Depression    Diabetes mellitus, type 2 ([facility])    DM retinopathy ([facility])    Dyspnea    Edema    GERD (gastroesophageal reflux disease)    Hx of phlebitis    Hyperlipidemia    Hypertension    Mild allergic rhinitis    MRSA (methicillin resistant staph aureus) culture positive 03/2006   Nonobstructive CAD (coronary artery disease)    a. 12/2015 Cath: LAD min irregs, otw nl cors; b. 08/2018 Cor CTA: Mid/Distal LAD and OM1 dzs w/ abnl ctFFR; c. 09/2018 Cath: LM nl, LAD 40p, 87m, D1 50, LCX small-nl, RCA nl, EF 55-65%; c. 10/2021 Cath: LM nl, LAD 40p/91m, lat D1 60, LCX sm nl, OM1 nl, RCA large, nl, RPDA/PAV/PL1-3 nl.   OA (osteoarthritis)    hands   Sleep apnea    CPAP    Past Surgical History: Past Surgical History:  Procedure Laterality Date   ABDOMINAL HYSTERECTOMY     ADENOIDECTOMY     APPENDECTOMY  1989   barium enema  1998   neg CT   CARDIAC CATHETERIZATION Bilateral 12/08/2015   Procedure: Right/Left Heart Cath and Coronary Angiography;  Surgeon: Wellington Hampshire, MD;  Location: [facility];  Service: Cardiovascular;  Laterality: Bilateral;   CATARACT EXTRACTION W/PHACO Left 05/12/2019   Procedure: CATARACT EXTRACTION PHACO AND INTRAOCULAR  LENS PLACEMENT ([place]) LEFT VISION BLUE;  Surgeon: Marchia Meiers, MD;  Location: [facility];  Service: Ophthalmology;  Laterality: Left;  12.80 1:17.1   CATARACT EXTRACTION W/PHACO Right 06/11/2019   Procedure: CATARACT EXTRACTION PHACO AND INTRAOCULAR LENS PLACEMENT ([facility]) RIGHT DIABETIC VISION BLUE 12.91  01:21.2;  Surgeon: Marchia Meiers, MD;  Location: [facility];  Service: Ophthalmology;  Laterality: Right;  Diabetic - insulin and oral meds   CHOLECYSTECTOMY  1989   LEFT HEART CATH AND CORONARY ANGIOGRAPHY Left 09/15/2018   Procedure: LEFT HEART CATH  AND CORONARY ANGIOGRAPHY;  Surgeon: Wellington Hampshire, MD;  Location: [facility];  Service: Cardiovascular;  Laterality: Left;   LUMBAR LAMINECTOMY/DECOMPRESSION MICRODISCECTOMY N/A 11/25/2014   Procedure: LUMBAR LAMINECTOMY/DECOMPRESSION MICRODISCECTOMY LUMBAR TWO-THREE, LUMBAR THREE-FOUR ;  Surgeon: Newman Pies, MD;  Location: [facility];  Service: Neurosurgery;  Laterality: N/A;   RIGHT/LEFT HEART CATH AND CORONARY ANGIOGRAPHY Bilateral 10/23/2021   Procedure: RIGHT/LEFT HEART CATH AND CORONARY ANGIOGRAPHY;  Surgeon: Wellington Hampshire, MD;  Location: [facility];  Service: Cardiovascular;  Laterality: Bilateral;   ROTATOR CUFF REPAIR  08/2001   right    Allergies: Allergies as of 04/19/2022 - Review Complete 12/15/2021  Allergen Reaction Noted   Tegaderm ag mesh [silver] Rash 12/15/2015   Amoxicillin Hives and Rash 07/14/2013   Metformin Diarrhea and Nausea And Vomiting 11/16/2014   Oxycodone Itching 09/11/2018   Tape Other (See Comments) 09/11/2018   Azithromycin Rash and Itching 11/16/2014   Clindamycin Hives and Rash 11/16/2014   Fluconazole Itching    Simvastatin Other (See Comments) 11/16/2014    Medications: Current Meds  Medication Sig   OZEMPIC, 2 MG/DOSE, 8 MG/3ML SOPN INJECT 2 MG UNDER THE SKIN ONCE A WEEK   TOUJEO MAX SOLOSTAR 300 UNIT/ML Solostar Pen Inject into the skin.    Social History: Social History   Tobacco Use   Smoking status: Never   Smokeless tobacco: Never  Vaping Use   Vaping Use: Never used  Substance Use Topics   Alcohol use: No   Drug use: No    Family Medical History: Family History  Problem Relation Age of Onset   Cancer Mother 70       lung   Diabetes Mother    Alcohol abuse Mother    Alcohol abuse Father    Cancer Father        throat   Aneurysm Father    Diabetes Maternal Grandmother    Aneurysm Paternal Grandmother    Breast cancer Paternal Aunt        20's   Cancer Sister     Physical  Examination: Vitals:   04/19/22 1418  BP: 128/76    General: Patient is well developed, well nourished, calm, collected, and in no apparent distress. Attention to examination is appropriate.  Neck:   Supple.  Full range of motion.  Respiratory: Patient is breathing without any difficulty.   NEUROLOGICAL:     Awake, alert, oriented to person, place, and time.  Speech is clear and fluent.   Cranial Nerves: Pupils equal round and reactive to light.  Facial tone is symmetric.  Facial sensation is symmetric. Shoulder shrug is symmetric. Tongue protrusion is midline.  There is no pronator drift.  ROM of spine: full.    Strength: Side Biceps Triceps Deltoid Interossei Grip Wrist Ext. Wrist Flex.  R 5 5 4 4 4 5 5   L 5 5 4 4 4 5 5    Side Iliopsoas  Quads Hamstring PF DF EHL  R 5 5 5 5 5 5   L 5 5 5 5 5 5    Reflexes are 3+ and symmetric at the biceps, triceps, brachioradialis, patella and achilles.   Hoffman's is present.  She has a suprapatellar reflex bilaterally.  Bilateral upper and lower extremity sensation is symmetric to light touch.   She has some diminished light touch sensation in her distal forearms. No evidence of dysmetria noted.  Gait is abnormal and wide-based.  She has moderate difficulty with tandem walk.     Medical Decision Making  Imaging: MRI CL spine 03/19/2022 IMPRESSION: 1. Progressive multilevel cervical spondylosis, most pronounced at the C4-5 and C5-6 levels where there is severe canal stenosis. Mild cord compression at C4-5. 2. Severe right and moderate left foraminal stenosis at C5-6.     Electronically Signed   By: Davina Poke D.O.   On: 03/20/2022 09:54  IMPRESSION: 1. Transitional anatomy, as above. 2. Multilevel degenerative changes of the lumbar spine, progressed at L1-L2 where there is severe canal stenosis with severe left and moderate right foraminal stenosis. 3. Severe right and moderate-severe left foraminal stenosis  at L2-L3. 4. Moderate-severe bilateral foraminal stenosis at L3-L4.     Electronically Signed   By: Davina Poke D.O.   On: 03/20/2022 10:06   I have personally reviewed the images and agree with the above interpretation.  Assessment and Plan: Ms. Minck is a pleasant 77 y.o. 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  She has clear clinical symptoms of this including elevated reflexes and difficulty with walking.  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  Due to her symptoms from cervical myelopathy, I recommended C4-6 anterior cervical discectomy and fusion.  There is no role for conservative management in this condition.  I discussed the planned procedure at length with the patient, including the risks, benefits, alternatives, and indications. The risks discussed include but are not limited to bleeding, infection, need for reoperation, spinal fluid leak, stroke, vision loss, anesthetic complication, coma, paralysis, and even death. We also discussed the possibility of post-operative dysphagia, vocal cord paralysis, and the risk of adjacent segment disease in the future. I also described in detail that improvement was not guaranteed.  The patient expressed understanding of these risks, and asked that we proceed with surgery. I described the surgery in layman's terms, and gave ample opportunity for questions, which were answered to the best of my ability.   Her back pain is a more complex issue.  To fully address her change in posture in addition to her lumbar stenosis, a substantial scoliosis operation would be indicated.  That being said, I do not feel she is an appropriate surgical candidate for that level surgery.  1 could also consider a more limited approach to addressing L1-4.  The final option would be considering spinal cord stimulation.  Have given her information on spinal cord stimulation.  After  she recovers from her cervical surgery, we will make final plans regarding the next steps for her back.  She will need clearance from her cardiologist before pursuing surgical intervention.    Thank you for involving me in the care of this patient.      Renji Berwick K. Izora Ribas MD, [facility] Neurosurgery

## 2022-04-19 ENCOUNTER — Encounter: Payer: Self-pay | Admitting: Neurosurgery

## 2022-04-19 ENCOUNTER — Other Ambulatory Visit: Payer: Self-pay | Admitting: Nephrology

## 2022-04-19 ENCOUNTER — Ambulatory Visit: Payer: Medicare Other | Admitting: Neurosurgery

## 2022-04-19 VITALS — BP 128/76 | Ht 64.0 in | Wt 253.0 lb

## 2022-04-19 DIAGNOSIS — E1122 Type 2 diabetes mellitus with diabetic chronic kidney disease: Secondary | ICD-10-CM

## 2022-04-19 DIAGNOSIS — M4316 Spondylolisthesis, lumbar region: Secondary | ICD-10-CM | POA: Diagnosis not present

## 2022-04-19 DIAGNOSIS — M4802 Spinal stenosis, cervical region: Secondary | ICD-10-CM

## 2022-04-19 DIAGNOSIS — M419 Scoliosis, unspecified: Secondary | ICD-10-CM

## 2022-04-19 DIAGNOSIS — M48062 Spinal stenosis, lumbar region with neurogenic claudication: Secondary | ICD-10-CM

## 2022-04-19 DIAGNOSIS — N1832 Chronic kidney disease, stage 3b: Secondary | ICD-10-CM

## 2022-04-19 DIAGNOSIS — G959 Disease of spinal cord, unspecified: Secondary | ICD-10-CM

## 2022-04-19 DIAGNOSIS — R8290 Unspecified abnormal findings in urine: Secondary | ICD-10-CM

## 2022-04-19 NOTE — Patient Instructions (Signed)
Please see below for information in regards to your upcoming surgery:   Planned surgery: C4-6 anterior cervical discectomy and fusion   Surgery date: 05/16/22 - you will find out your arrival time the business day before your surgery.   Pre-op appointment at [facility]: we will call you with a date/time for this. Pre-admit testing is located on the first floor of the [facility], [place], [address]. Please bring all prescriptions in the original prescription bottles to your appointment, even if you have reviewed medications by phone with a pharmacy representative. Pre-op labs may be done at your pre-op appointment. You are not required to fast for these labs. Should you need to change your pre-op appointment, please call Pre-admit testing at [phone].    Surgical clearance: we will send a clearance request to [facility] Reaper and Dr Bonnita Nasuti - hold for 7 days before surgery    NSAIDS (Non-steroidal anti-inflammatory drugs): because you are having a fusion, no NSAIDS (such as ibuprofen, aleve, naproxen, meloxicam, diclofenac) for 3 months after surgery. Celebrex is an exception. Tylenol is ok because it is not an NSAID.    Home health physical therapy: [facility] [place] will contact you regarding home health physical therapy for after surgery.Their number is [phone].    Because you are having a fusion: for appointments after your 2 week follow-up: please arrive at the [facility] outpatient imaging center ([place], [place]) or [facility] one hour prior to your appointment for x-rays. This applies to every appointment after your 2 week follow-up. Failure to do so may result in your appointment being rescheduled.    We can be reached by phone or mychart 8am-4pm, Monday-Friday. If you have any questions/concerns before or after surgery, you can reach [place] at  [phone], or you can send a mychart message. If you have a concern after hours that cannot wait until normal business hours, you can call [phone] and ask to page the neurosurgeon on call for [facility].    Appointments/FMLA & disability paperwork: [place]  Nurse: Ophelia Shoulder  Medical assistants: Lum Keas Physician Assistant's: [place] Surgeon: Meade Maw, MD

## 2022-04-23 ENCOUNTER — Telehealth: Payer: Self-pay | Admitting: Cardiovascular Disease

## 2022-04-23 ENCOUNTER — Telehealth: Payer: Self-pay | Admitting: *Deleted

## 2022-04-23 NOTE — Telephone Encounter (Signed)
   Name: Monica Ray  DOB: 1945-10-11  MRN: [id number]  Primary Cardiologist: Kathlyn Sacramento, MD   Preoperative team, please contact this patient and set up a phone call appointment for further preoperative risk assessment. Please obtain consent and complete medication review. Thank you for your help.  Recommendations for holding Ozempic will need to be provided by prescribing physician.    Mayra Reel, NP 04/23/2022, 11:38 AM [place]

## 2022-04-23 NOTE — Telephone Encounter (Signed)
   Pre-operative Risk Assessment    Patient Name: Monica Ray  DOB: 1945/03/04 MRN: [id number]      Request for Surgical Clearance    Procedure:   C4-6 ANTERIOR CERVICAL DISCECTOMY AND FUSION   Clearance 05/16/22                                Surgeon:  DR Meade Maw Surgeon's Group or Practice Name:  [facility] Phone number:  [phone] Fax number:  [phone]  Type of Clearance Requested:   - Pharmacy:  Hold OZYMPIC  7 DAYS PRIOR  Type of Anesthesia:  General    Additional requests/questions:    Signed, Eli Phillips   04/23/2022, 9:23 AM

## 2022-04-23 NOTE — Telephone Encounter (Signed)
Pt has been scheduled for tele pre op appt 04/26/22 @ 3 pm. Med rec and consent are done.

## 2022-04-23 NOTE — Telephone Encounter (Signed)
Pt has been scheduled for tele pre op appt 04/26/22 @ 3 pm. Med rec and consent are done.     Patient Consent for Virtual Visit        Monica Ray has provided verbal consent on 04/23/2022 for a virtual visit (video or telephone).   CONSENT FOR VIRTUAL VISIT FOR:  Monica Ray  By participating in this virtual visit I agree to the following:  I hereby voluntarily request, consent and authorize [facility] and its employed or contracted physicians, physician assistants, nurse practitioners or other licensed health care professionals (the Practitioner), to provide me with telemedicine health care services (the "Services") as deemed necessary by the treating Practitioner. I acknowledge and consent to receive the Services by the Practitioner via telemedicine. I understand that the telemedicine visit will involve communicating with the Practitioner through live audiovisual communication technology and the disclosure of certain medical information by electronic transmission. I acknowledge that I have been given the opportunity to request an in-person assessment or other available alternative prior to the telemedicine visit and am voluntarily participating in the telemedicine visit.  I understand that I have the right to withhold or withdraw my consent to the use of telemedicine in the course of my care at any time, without affecting my right to future care or treatment, and that the Practitioner or I may terminate the telemedicine visit at any time. I understand that I have the right to inspect all information obtained and/or recorded in the course of the telemedicine visit and may receive copies of available information for a reasonable fee.  I understand that some of the potential risks of receiving the Services via telemedicine include:  Delay or interruption in medical evaluation due to technological equipment failure or disruption; Information transmitted may not be sufficient (e.g.  poor resolution of images) to allow for appropriate medical decision making by the Practitioner; and/or  In rare instances, security protocols could fail, causing a breach of personal health information.  Furthermore, I acknowledge that it is my responsibility to provide information about my medical history, conditions and care that is complete and accurate to the best of my ability. I acknowledge that Practitioner's advice, recommendations, and/or decision may be based on factors not within their control, such as incomplete or inaccurate data provided by me or distortions of diagnostic images or specimens that may result from electronic transmissions. I understand that the practice of medicine is not an exact science and that Practitioner makes no warranties or guarantees regarding treatment outcomes. I acknowledge that a copy of this consent can be made available to me via my patient portal ([facility]), or I can request a printed copy by calling the office of [facility].    I understand that my insurance will be billed for this visit.   I have read or had this consent read to me. I understand the contents of this consent, which adequately explains the benefits and risks of the Services being provided via telemedicine.  I have been provided ample opportunity to ask questions regarding this consent and the Services and have had my questions answered to my satisfaction. I give my informed consent for the services to be provided through the use of telemedicine in my medical care

## 2022-04-25 ENCOUNTER — Other Ambulatory Visit: Payer: Self-pay

## 2022-04-25 DIAGNOSIS — Z01818 Encounter for other preprocedural examination: Secondary | ICD-10-CM

## 2022-04-26 ENCOUNTER — Ambulatory Visit: Payer: Medicare Other | Attending: Internal Medicine | Admitting: Physician Assistant

## 2022-04-26 DIAGNOSIS — Z01810 Encounter for preprocedural cardiovascular examination: Secondary | ICD-10-CM

## 2022-04-26 NOTE — Progress Notes (Signed)
Virtual Visit via Telephone Note   Because of Monica Ray's co-morbid illnesses, she is at least at moderate risk for complications without adequate follow up.  This format is felt to be most appropriate for this patient at this time.  The patient did not have access to video technology/had technical difficulties with video requiring transitioning to audio format only (telephone).  All issues noted in this document were discussed and addressed.  No physical exam could be performed with this format.  Please refer to the patient's chart for her consent to telehealth for Monica Ray.  Evaluation Performed:  Preoperative cardiovascular risk assessment _____________   Date:  04/26/2022   Patient ID:  Monica Ray, DOB Jun 21, 1945, MRN [id number] Patient Location:  Home Provider location:   Office  Primary Care Provider:  Marinda Elk, MD Primary Cardiologist:  Kathlyn Sacramento, MD  Chief Complaint / Patient Profile   77 y.o. y/o female with a h/o  who is pending C4-6 anterior cervical discectomy and fusion scheduled for 05/16/2022 and presents today for telephonic preoperative cardiovascular risk assessment.  History of Present Illness    Monica Ray is a 77 y.o. female who presents via audio/video conferencing for a telehealth visit today.  Pt was last seen in cardiology clinic on 12/15/2021 by Dr. Fletcher Anon.  At that time Monica Ray was doing well other than some worsening exertional dyspnea and fatigue with recent echocardiogram done in August of last year it was thought that physical deconditioning was the main culprit of her symptoms at the time.  The patient is now pending procedure as outlined above. Since her last visit, she has been doing okay.  She has no problems with her heart.  No chest pain.  She is using a walker when walking longer distances.  She goes to church every Sunday and sings in the choir.  No major changes with her cardiac health since her last  office visit.  No medications indicated as needing held.  Ozempic holding parameters will need to come from the prescribing physician.  Past Medical History    Past Medical History:  Diagnosis Date   Anemia    before hysterectomy   Anginal pain (HCC)    Chronic heart failure with preserved ejection fraction (HFpEF) ([place])    a. 09/2021 Echo: EF 60-65%, no rwma, GrI DD, nl RV fxn, RVSP 47.65mmHg. Mildly dil LA; b. 10/2021 RHC: RA 15, RV 50/9, PA 48/21 (32), PCWP 21, LVEDP 27.   Depression    Diabetes mellitus, type 2 ([place])    DM retinopathy ([place])    Dyspnea    Edema    GERD (gastroesophageal reflux disease)    Hx of phlebitis    Hyperlipidemia    Hypertension    Mild allergic rhinitis    MRSA (methicillin resistant staph aureus) culture positive 03/2006   Nonobstructive CAD (coronary artery disease)    a. 12/2015 Cath: LAD min irregs, otw nl cors; b. 08/2018 Cor CTA: Mid/Distal LAD and OM1 dzs w/ abnl ctFFR; c. 09/2018 Cath: LM nl, LAD 40p, 11m, D1 50, LCX small-nl, RCA nl, EF 55-65%; c. 10/2021 Cath: LM nl, LAD 40p/34m, lat D1 60, LCX sm nl, OM1 nl, RCA large, nl, RPDA/PAV/PL1-3 nl.   OA (osteoarthritis)    hands   Sleep apnea    CPAP   Past Surgical History:  Procedure Laterality Date   ABDOMINAL HYSTERECTOMY     [place]  barium enema  1998   neg CT   CARDIAC CATHETERIZATION Bilateral 12/08/2015   Procedure: Right/Left Heart Cath and Coronary Angiography;  Surgeon: Wellington Hampshire, MD;  Location: [place] [facility];  Service: Cardiovascular;  Laterality: Bilateral;   CATARACT EXTRACTION W/PHACO Left 05/12/2019   Procedure: CATARACT EXTRACTION PHACO AND INTRAOCULAR LENS PLACEMENT ([place]) LEFT VISION BLUE;  Surgeon: Marchia Meiers, MD;  Location: [facility];  Service: Ophthalmology;  Laterality: Left;  12.80 1:17.1   CATARACT EXTRACTION W/PHACO Right 06/11/2019   Procedure: CATARACT EXTRACTION PHACO AND INTRAOCULAR LENS PLACEMENT ([facility]) RIGHT  DIABETIC VISION BLUE 12.91  01:21.2;  Surgeon: Marchia Meiers, MD;  Location: [facility];  Service: Ophthalmology;  Laterality: Right;  Diabetic - insulin and oral meds   CHOLECYSTECTOMY  1989   LEFT HEART CATH AND CORONARY ANGIOGRAPHY Left 09/15/2018   Procedure: LEFT HEART CATH AND CORONARY ANGIOGRAPHY;  Surgeon: Wellington Hampshire, MD;  Location: [place] [facility];  Service: Cardiovascular;  Laterality: Left;   LUMBAR LAMINECTOMY/DECOMPRESSION MICRODISCECTOMY N/A 11/25/2014   Procedure: LUMBAR LAMINECTOMY/DECOMPRESSION MICRODISCECTOMY LUMBAR TWO-THREE, LUMBAR THREE-FOUR ;  Surgeon: Newman Pies, MD;  Location: [facility];  Service: Neurosurgery;  Laterality: N/A;   RIGHT/LEFT HEART CATH AND CORONARY ANGIOGRAPHY Bilateral 10/23/2021   Procedure: RIGHT/LEFT HEART CATH AND CORONARY ANGIOGRAPHY;  Surgeon: Wellington Hampshire, MD;  Location: [facility] [facility];  Service: Cardiovascular;  Laterality: Bilateral;   ROTATOR CUFF REPAIR  08/2001   right    Allergies  Allergies  Allergen Reactions   Tegaderm Ag Mesh [Silver] Rash   Amoxicillin Hives and Rash    Did it involve swelling of the face/tongue/throat, SOB, or low BP? No Did it involve sudden or severe rash/hives, skin peeling, or any reaction on the inside of your mouth or nose? No Did you need to seek medical attention at a hospital or doctor's office? No When did it last happen?  ~2015  If all above answers are "NO", may proceed with cephalosporin use.    Metformin Diarrhea and Nausea And Vomiting   Oxycodone Itching   Tape Other (See Comments)    Caused burning sensation, Tagaderm    Azithromycin Rash and Itching   Clindamycin Hives and Rash   Fluconazole Itching   Simvastatin Other (See Comments)    Back pain    Home Medications    Prior to Admission medications   Medication Sig Start Date End Date Taking? Authorizing Provider  ascorbic acid (VITAMIN C) 500 MG tablet Take by mouth.    [provider]   CALCIUM PO Take by mouth daily in the afternoon.    [provider]  Cholecalciferol (VITAMIN D3) 125 MCG (5000 UT) TABS Take 5,000 Units by mouth daily.     [provider]  clotrimazole-betamethasone (LOTRISONE) cream Apply 1 application topically 2 (two) times daily as needed (rash).  12/21/15   [provider]  Cyanocobalamin (VITAMIN B12) 3000 MCG SUBL Take 3,000 mg by mouth daily.    [provider]  diltiazem (CARDIZEM CD) 120 MG 24 hr capsule Take 1 capsule (120 mg total) by mouth daily. 03/11/20 02/24/30  Wellington Hampshire, MD  DULoxetine (CYMBALTA) 60 MG capsule Take 60 mg by mouth daily.    [provider]  furosemide (LASIX) 20 MG tablet Take 1 tablet (20 mg total) by mouth daily as needed (As needed for weight gain of 2 pounds in 24 hours or 5 pounds over the course of the week.). 11/17/21 04/23/22  Theora Gianotti, NP  insulin lispro (HUMALOG) 100 UNIT/ML injection Inject 24-36 Units into the skin 3 (three) times daily with meals. 24 units before breakfast and lunch and 28 units before supper - plus sliding scale (4 units per 50 over a target of 150)    [provider]  irbesartan (AVAPRO) 300 MG tablet Take 1 tablet (300 mg total) by mouth daily. 03/11/20 04/23/22  Wellington Hampshire, MD  meloxicam (MOBIC) 7.5 MG tablet TAKE ONE CAPSULE BY MOUTH DAILY Patient taking differently: Take 7.5 mg by mouth daily. 04/12/14   Tower, Wynelle Fanny, MD  nitroGLYCERIN (NITROSTAT) 0.4 MG SL tablet Place 1 tablet (0.4 mg total) under the tongue every 5 (five) minutes as needed for chest pain. 10/31/21 04/23/22  Theora Gianotti, NP  OZEMPIC, 2 MG/DOSE, 8 MG/3ML SOPN INJECT 2 MG UNDER THE SKIN ONCE A WEEK 01/31/22   [provider]  pioglitazone (ACTOS) 45 MG tablet Take 22.5 mg by mouth daily.     [provider]  rosuvastatin (CRESTOR) 40 MG tablet TAKE 1 TABLET(40 MG) BY MOUTH DAILY Patient taking differently: Take 40 mg by  mouth daily. 07/25/20   Dunn, Areta Haber, PA-C  spironolactone (ALDACTONE) 25 MG tablet Take 1 tablet (25 mg total) by mouth daily. 10/23/21 10/23/22  Wellington Hampshire, MD  TOUJEO MAX SOLOSTAR 300 UNIT/ML Solostar Pen Inject into the skin. 01/31/22   [provider]    Physical Exam    Vital Signs:  [name] does not have vital signs available for review today.  Given telephonic nature of communication, physical exam is limited. AAOx3. NAD. Normal affect.  Speech and respirations are unlabored.  Accessory Clinical Findings    None  Assessment & Plan    1.  Preoperative Cardiovascular Risk Assessment:  Ms. Blake's perioperative risk of a major cardiac event is 11% according to the Revised Cardiac Risk Index (RCRI).  Therefore, she is at high risk for perioperative complications.   Her functional capacity is good at 5.07 METs according to the Duke Activity Status Index (DASI). Recommendations: According to ACC/AHA guidelines, no further cardiovascular testing needed.  The patient may proceed to surgery at acceptable risk.     The patient was advised that if she develops new symptoms prior to surgery to contact our office to arrange for a follow-up visit, and she verbalized understanding.  A copy of this note will be routed to requesting surgeon.  Time:   Today, I have spent 10 minutes with the patient with telehealth technology discussing medical history, symptoms, and management plan.     Elgie Collard, PA-C  04/26/2022, 3:02 PM

## 2022-05-02 NOTE — Patient Instructions (Addendum)
Your procedure is scheduled on:05-16-22 Wednesday Report to the Registration Desk on the 1st floor of the [facility].Then proceed to the 2nd floor Surgery Desk To find out your arrival time, please call [phone] between 1PM - 3PM on:05-15-22 Tuesday If your arrival time is 6:00 am, do not arrive before that time as the [facility] entrance doors do not open until 6:00 am.  REMEMBER: Instructions that are not followed completely may result in serious medical risk, up to and including death; or upon the discretion of your surgeon and anesthesiologist your surgery may need to be rescheduled.  Do not eat food after midnight the night before surgery.  No gum chewing or hard candies.  You may however, drink Water up to 2 hours before you are scheduled to arrive for your surgery. Do not drink anything within 2 hours of your scheduled arrival time.  One week prior to surgery:Last dose will be on 05-08-22 Stop Anti-inflammatories (NSAIDS) such as meloxicam (MOBIC), Advil, Aleve, Ibuprofen, Motrin, Naproxen, Naprosyn and Aspirin based products such as Excedrin, Goody's Powder, BC Powder.You may however, continue to take Tylenol if needed for pain up until the day of surgery.  Stop ANY OVER THE COUNTER supplements/vitamins 7 days prior to surgery (Vitamin C, Calcium, Vitamin D3, Vitamin B12)  Stop your OZEMPIC 7 days prior to surgery-Your last dose will be on 05-07-22-Do NOT take again until after your surgery  Take half of your Toujeo (25 units)  the night before surgery-Do NOT take any Insulin the morning of your surgery  TAKE ONLY THESE MEDICATIONS THE MORNING OF SURGERY WITH A SIP OF WATER: -diltiazem (CARDIZEM CD)  -DULoxetine (CYMBALTA)  -rosuvastatin (CRESTOR)   No Alcohol for 24 hours before or after surgery.  No Smoking including e-cigarettes for 24 hours before surgery.  No chewable tobacco products for at least 6 hours before surgery.  No nicotine patches on the day of surgery.  Do  not use any "recreational" drugs for at least a week (preferably 2 weeks) before your surgery.  Please be advised that the combination of cocaine and anesthesia may have negative outcomes, up to and including death. If you test positive for cocaine, your surgery will be cancelled.  On the morning of surgery brush your teeth with toothpaste and water, you may rinse your mouth with mouthwash if you wish. Do not swallow any toothpaste or mouthwash.  Use CHG Soap as directed on instruction sheet.  Bring your C-Pap machine to the hospital  Do not wear jewelry, make-up, hairpins, clips or nail polish.  Do not wear lotions, powders, or perfumes.   Do not shave body hair from the neck down 48 hours before surgery.  Contact lenses, hearing aids and dentures may not be worn into surgery.  Do not bring valuables to the hospital. [facility] is not responsible for any missing/lost belongings or valuables.   Notify your doctor if there is any change in your medical condition (cold, fever, infection).  Wear comfortable clothing (specific to your surgery type) to the hospital.  After surgery, you can help prevent lung complications by doing breathing exercises.  Take deep breaths and cough every 1-2 hours. Your doctor may order a device called an Incentive Spirometer to help you take deep breaths. When coughing or sneezing, hold a pillow firmly against your incision with both hands. This is called "splinting." Doing this helps protect your incision. It also decreases belly discomfort.  If you are being admitted to the hospital overnight, leave  your suitcase in the car. After surgery it may be brought to your room.  In case of increased patient census, it may be necessary for you, the patient, to continue your postoperative care in the Same Day Surgery department.  If you are being discharged the day of surgery, you will not be allowed to drive home. You will need a responsible individual to drive  you home and stay with you for 24 hours after surgery.   If you are taking public transportation, you will need to have a responsible individual with you.  Please call the [facility] Dept. at [phone] if you have any questions about these instructions.  Surgery Visitation Policy:  Patients having surgery or a procedure may have two visitors.  Children under the age of 31 must have an adult with them who is not the patient.  Inpatient Visitation:    Visiting hours are 7 a.m. to 8 p.m. Up to four visitors are allowed at one time in a patient room. The visitors may rotate out with other people during the day.  One visitor age 40 or older may stay with the patient overnight and must be in the room by 8 p.m.     Preparing for Surgery with CHLORHEXIDINE GLUCONATE (CHG) Soap  Chlorhexidine Gluconate (CHG) Soap  o An antiseptic cleaner that kills germs and bonds with the skin to continue killing germs even after washing  o Used for showering the night before surgery and morning of surgery  Before surgery, you can play an important role by reducing the number of germs on your skin.  CHG (Chlorhexidine gluconate) soap is an antiseptic cleanser which kills germs and bonds with the skin to continue killing germs even after washing.  Please do not use if you have an allergy to CHG or antibacterial soaps. If your skin becomes reddened/irritated stop using the CHG.  1. Shower the NIGHT BEFORE SURGERY and the MORNING OF SURGERY with CHG soap.  2. If you choose to wash your hair, wash your hair first as usual with your normal shampoo.  3. After shampooing, rinse your hair and body thoroughly to remove the shampoo.  4. Use CHG as you would any other liquid soap. You can apply CHG directly to the skin and wash gently with a scrungie or a clean washcloth.  5. Apply the CHG soap to your body only from the neck down. Do not use on open wounds or open sores. Avoid contact with  your eyes, ears, mouth, and genitals (private parts). Wash face and genitals (private parts) with your normal soap.  6. Wash thoroughly, paying special attention to the area where your surgery will be performed.  7. Thoroughly rinse your body with warm water.  8. Do not shower/wash with your normal soap after using and rinsing off the CHG soap.  9. Pat yourself dry with a clean towel.  10. Wear clean pajamas to bed the night before surgery.  12. Place clean sheets on your bed the night of your first shower and do not sleep with pets.  13. Shower again with the CHG soap on the day of surgery prior to arriving at the hospital.  14. Do not apply any deodorants/lotions/powders.  15. Please wear clean clothes to the hospital.

## 2022-05-03 ENCOUNTER — Ambulatory Visit
Admission: RE | Admit: 2022-05-03 | Discharge: 2022-05-03 | Disposition: A | Discharge status: Home / Self Care | Payer: Medicare Other | Source: Ambulatory Visit | Attending: Nephrology | Admitting: Nephrology

## 2022-05-03 ENCOUNTER — Encounter
Admission: RE | Admit: 2022-05-03 | Discharge: 2022-05-03 | Disposition: A | Discharge status: Home / Self Care | Payer: Medicare Other | Source: Ambulatory Visit | Attending: Neurosurgery | Admitting: Neurosurgery

## 2022-05-03 VITALS — BP 112/48 | HR 63 | Resp 14 | Ht 64.0 in | Wt 239.6 lb

## 2022-05-03 DIAGNOSIS — Z01812 Encounter for preprocedural laboratory examination: Secondary | ICD-10-CM

## 2022-05-03 DIAGNOSIS — E1122 Type 2 diabetes mellitus with diabetic chronic kidney disease: Secondary | ICD-10-CM | POA: Diagnosis present

## 2022-05-03 DIAGNOSIS — R8290 Unspecified abnormal findings in urine: Secondary | ICD-10-CM | POA: Insufficient documentation

## 2022-05-03 DIAGNOSIS — Z794 Long term (current) use of insulin: Secondary | ICD-10-CM | POA: Insufficient documentation

## 2022-05-03 DIAGNOSIS — E119 Type 2 diabetes mellitus without complications: Secondary | ICD-10-CM | POA: Insufficient documentation

## 2022-05-03 DIAGNOSIS — N1832 Chronic kidney disease, stage 3b: Secondary | ICD-10-CM | POA: Diagnosis present

## 2022-05-03 DIAGNOSIS — Z01818 Encounter for other preprocedural examination: Secondary | ICD-10-CM

## 2022-05-03 HISTORY — DX: Other intervertebral disc degeneration, lumbar region: M51.36

## 2022-05-03 HISTORY — DX: Chronic kidney disease, stage 3 unspecified: N18.30

## 2022-05-03 HISTORY — DX: Pulmonary hypertension, unspecified: I27.20

## 2022-05-03 HISTORY — DX: Atherosclerosis of aorta: I70.0

## 2022-05-03 HISTORY — DX: Other intervertebral disc degeneration, lumbar region without mention of lumbar back pain or lower extremity pain: M51.369

## 2022-05-03 HISTORY — DX: Obesity, unspecified: E66.9

## 2022-05-03 LAB — TYPE AND SCREEN
ABO/RH(D): A POS
Antibody Screen: NEGATIVE

## 2022-05-03 LAB — SURGICAL PCR SCREEN
MRSA, PCR: NEGATIVE
Staphylococcus aureus: POSITIVE — AB

## 2022-05-04 LAB — HEMOGLOBIN A1C
Hgb A1c MFr Bld: 7 % — ABNORMAL HIGH (ref 4.8–5.6)
Mean Plasma Glucose: 154 mg/dL

## 2022-05-07 NOTE — Progress Notes (Signed)
  Monica Ray    Date: 05/07/22  Name: Monica Ray MRN:   [id number]  Re: GLP-1 clearance and provider recommendations   Planned Surgical Procedure(s):    Case: [id number] Date/Time: 05/16/22 1155   Procedure: C4-6 ANTERIOR CERVICAL DISCECTOMY AND FUSION   Anesthesia type: General   Pre-op diagnosis:      G95.9 Cervical myelopathy     M48.02 Cervical stenosis   Location: [facility] OR ROOM 03 / [facility]   Surgeons: Monica Maw, MD      Clinical Notes:  Patient is scheduled for the above procedure with the indicated provider/surgeon. In review of her medication reconciliation it was noted that patient is on a prescribed GLP-1 medication. Per guidelines issued by the American Society of Anesthesiologists (ASA), it is recommended that these medications be held for 7 days prior to the patient undergoing any type of elective surgical procedure. The patient is taking the following GLP-1 medication:  [x]  SEMAGLUTIDE   []  EXENATIDE  []  LIRAGLUTIDE   []  LIXISENATIDE  []  DULAGLUTIDE     []  OTHER GLP-1 medication: _______________  Reached out to prescribing provider Monica Carina, MD) to make them aware of the guidelines from anesthesia. Given that this patient takes the prescribed GLP-1 medication for her  diabetes diagnosis, rather than for weight loss, recommendations from the prescribing provider were solicited. Prescribing provider made aware of the following so that informed decision/POC can be developed for this patient that may be taking medications belonging to these drug classes:  Oral GLP-1 medications will be held 1 day prior to surgery.  Injectable GLP-1 medications will be held 7 days prior to surgery.  Metformin is routinely held 48 hours prior to surgery due to renal concerns, potential need for contrasted imaging perioperatively, and the potential for tissue hypoxia leading to drug induced lactic acidosis.  All  SGLT2i medications are held 72 hours prior to surgery as they can be associated with the increased potential for developing euglycemic diabetic ketoacidosis (EDKA).   Impression and Plan:  Monica Ray is on a prescribed GLP-1 medication, which induces the known side effect of decreased gastric emptying. Efforts are bring made to mitigate the risk of Monica hyperglycemic events, as elevated blood glucose levels have been found to contribute to intra/postoperative complications. Additionally, hyperglycemic extremes can potentially necessitate the postponing of a patient's elective case in order to better optimize Monica glycemic control, again with the aforementioned guidelines in place. With this in mind, recommendations have been sought from the prescribing provider, who has cleared patient to proceed with holding the prescribed GLP-1 as per the guidelines from the ASA.   Provider recommending: no further recommendations received from the prescribing provider.  Copy of signed clearance and recommendations placed on patient's chart for inclusion in their medical record and for review by the surgical/anesthetic team on the day of her procedure.   Monica Loh, MSN, APRN, FNP-C, CEN Monica Ray  Peri-operative Services Nurse Practitioner Phone: [phone] 05/07/22 1:09 PM  NOTE: This note has been prepared using Dragon dictation software. Despite my best ability to proofread, there is always the potential that unintentional transcriptional errors may still occur from this process.

## 2022-05-09 ENCOUNTER — Telehealth: Payer: Self-pay | Admitting: Neurosurgery

## 2022-05-09 ENCOUNTER — Other Ambulatory Visit: Payer: Self-pay | Admitting: Neurosurgery

## 2022-05-09 MED ORDER — CELECOXIB 100 MG PO CAPS: 100.0000 mg | ORAL_CAPSULE | Freq: Two times a day (BID) | ORAL | 2 refills | Status: DC

## 2022-05-09 NOTE — Telephone Encounter (Signed)
Patient aware of medication refill.

## 2022-05-09 NOTE — Telephone Encounter (Signed)
Is this ok?

## 2022-05-09 NOTE — Telephone Encounter (Signed)
C4-6 ACDF on 05/16/22  Dr.Yarbrough told her to stop meloxicam before her surgery and he would call in celebrex to [facility]. Can that be sent in please.

## 2022-05-10 ENCOUNTER — Encounter: Payer: Self-pay | Admitting: Neurosurgery

## 2022-05-10 NOTE — Progress Notes (Signed)
Perioperative / Anesthesia Services  Pre-Admission Testing Clinical Review / Preoperative Anesthesia Consult  Date: 05/14/22  Patient Demographics:  Name: Monica Ray DOB:   Sep 27, 1945 MRN:   [id number]  Planned Surgical Procedure(s):    Case: [id number] Date/Time: 05/16/22 1155   Procedure: C4-6 ANTERIOR CERVICAL DISCECTOMY AND FUSION   Anesthesia type: General   Pre-op diagnosis:      G95.9 Cervical myelopathy     M48.02 Cervical stenosis   Location: [facility] OR ROOM 03 / [facility]   Surgeons: Venetia Night, MD     NOTE: Available PAT nursing documentation and vital signs have been reviewed. Clinical nursing staff has updated patient's PMH/PSHx, current medication list, and drug allergies/intolerances to ensure comprehensive history available to assist in medical decision making as it pertains to the aforementioned surgical procedure and anticipated anesthetic course. Extensive review of available clinical information personally performed. [place] PMH and PSHx updated with any diagnoses/procedures that  may have been inadvertently omitted during her intake with the pre-admission testing department's nursing staff.  Clinical Discussion:  Monica Ray is a 77 y.o. female who is submitted for pre-surgical anesthesia review and clearance prior to her undergoing the above procedure. Patient has never been a smoker. Pertinent PMH includes: CAD, HFpEF, pulmonary hypertension, angina, aortic atherosclerosis, HTN, HLD, T2DM, CKD-III, DOE, OSAH (requires nocturnal PAP therapy), anemia, lumbar DDD, lumbar stenosis, cervical stenosis with associated myelopathy, depression.  Patient is followed by cardiology Kirke Corin, MD). She was last seen in the cardiology clinic on 12/15/2021; notes reviewed.  At the time of her clinic visit, patient reporting worsening exertional dyspnea with no associated chest pain.  She denied any PND, orthopnea, palpitations, significant peripheral  edema, vertiginous symptoms, weakness, fatigue, vertiginous symptoms, or presyncope/syncope. Patient with a past medical history significant for cardiovascular diagnoses. Documented physical exam was grossly benign, providing no evidence of acute exacerbation and/or decompensation of the patient's known cardiovascular conditions.  RIGHT/LEFT diagnostic heart catheterization was performed on 12/08/2015 revealing a normal left ventricular systolic function with an EF of 55-65%.  There were minor luminal irregularities noted in the LAD.  Hemodynamics: Mean PA = 22 mmHg, mean PCWP = 15 mmHg, AO saturation = 93.5%, cardiac output 6.69 L/min, and cardiac index 3.04 L/min/m.  Findings consistent with mild pulmonary hypertension.  Coronary CTA performed on 09/02/2018 revealed an elevated coronary calcium score of 929.  This was the 8 percentile for age and sex matched control.  Study demonstrated 50-69% stenosis of the proximal to mid LAD including the takeoff of D1, where 1-24% calcific disease in the distal vessel is noted.  Less than 25% ostial to proximal RCA and 25-49% mid RCA disease noted.  Cardiology also commented on the presence of severe mitral annular calcification.  Study was sent for [facility] analysis that demonstrated hemodynamically significant disease in the mid-distal LAD and OM1 (normal range >0.80).  Cardiac catheterization was recommended.  FFR CT in mid LAD = 0.81 and distal LAD = 0.71 FFR CT in OM1 = 0.77 FFR CT in RCA normal   Patient underwent diagnostic LEFT heart catheterization on 09/15/2018 revealing a normal left ventricular systolic function with an EF of 55 to 65%.  LVEDP moderately elevated.  There was multivessel CAD; 30% mid LAD, 50% lateral D1, and 40% proximal LAD.  Given the nonobstructive nature of her coronary artery disease, the decision was made to defer intervention opting for aggressive medical management.  Most recent TTE was performed on 09/21/2021 revealing a normal  left ventricular systolic function with EF of 60-65%.  There were no regional wall motion abnormalities.  There was mild left ventricular hypertrophy. Left ventricular diastolic Doppler parameters consistent with abnormal relaxation (G1DD).  Right ventricular systolic function normal.  PASP elevated at 47.8 mmHg consistent with known pulmonary hypertension.  Left atrium mildly dilated.  All transvalvular gradients were noted to be normal providing no evidence suggestive of valvular stenosis.  Repeat RIGHT/LEFT diagnostic heart catheterization was performed on 10/23/2021.  Study demonstrated multivessel CAD; 30% mid LAD, 40% proximal LAD, and 50% lateral D1.  Hemodynamics: Mean PA = 32 mmHg, mean PCWP = 21 mmHg, PA saturation 73%, PVR <1 Woods unit, and cardiac output 11.5 L/min.  Findings consistent with known pulmonary hypertension due to chronic diastolic heart failure.  Blood pressure well controlled at 120/58 mmHg on currently prescribed CCB (diltiazem), diuretic (furosemide + spironolactone), and ARB (irbesartan) therapies.  Patient is on rosuvastatin for her HLD diagnosis and ASCVD prevention.  Additionally, patient has a supply of short acting nitrates (NTG) to use on a as needed basis for recurrent anginal/anginal equivalent symptoms; denied recent use.  At the time of her visit with cardiology, T2DM reasonably controlled on currently prescribed regimen; last HgbA1c was 7.7% when checked on 10/12/2021.  Of note, since last seen by cardiology, with further improvement to 7.0% when checked on 05/03/2022. Patient does have an OSAH diagnosis, and is reported to be compliant with prescribed nocturnal PAP therapy.  Functional capacity is severely limited by patient's lower back pain.  However, with that being said, patient still felt to be able to achieve at least 4 METS of physical activity without experiencing any degree of significant angina/anginal equivalent symptoms.  No changes were made to her  medication regimen.  Patient to follow-up with outpatient cardiology in 6 months or sooner if needed.  Monica Ray is scheduled for an elective C4-6 ANTERIOR CERVICAL DISCECTOMY AND FUSION on 0 14 2024 with Dr. Volanda Napoleon, MD.  Given patient's past medical history significant for cardiovascular diagnoses, presurgical cardiac clearance was sought by the PAT team.  Per cardiology, "Ms. Clock's perioperative risk of a major cardiac event is 11% according to the Revised Cardiac Risk Index (RCRI).  Therefore, she is at high risk for perioperative complications. Her functional capacity is good at 5.07 METs according to the Duke Activity Status Index (DASI). According to ACC/AHA guidelines, no further cardiovascular testing needed. The patient may proceed to surgery at ACCEPTABLE risk".  In review of her medication reconciliation, the patient is not noted to be taking any type of anticoagulation or antiplatelet therapies that would need to be held during her perioperative course.  Patient denies previous perioperative complications with anesthesia in the past. In review of the available records, it is noted that patient underwent a MAC anesthetic course at [facility] (ASA III) in 06/2019 without documented complications.      05/03/2022   10:46 AM 05/03/2022   10:41 AM 04/19/2022    2:18 PM  Vitals with BMI  Height  5\' 4"  5\' 4"   Weight  239 lbs 10 oz 253 lbs  BMI  41.11 43.41  Systolic 112 106 789  Diastolic 48 40 76  Pulse  63     Providers/Specialists:   NOTE: Primary physician provider listed below. Patient may have been seen by APP or partner within same practice.   PROVIDER ROLE / SPECIALTY LAST Donalynn Furlong, MD Neurosurgery (Surgeon) 04/19/2022  Patrice Paradise, MD Primary  Care Provider 02/28/2022  Lorine Bears, MD Cardiology 12/15/2021  Lorain Childes, MD Nephrology 04/17/2022  Wendall Mola, MD Endocrinology 01/31/2022  Merri Ray, MD  Physiatry 03/06/2022   Allergies:  Tegaderm ag mesh [silver], Amoxicillin, Metformin, Oxycodone, Tape, Azithromycin, Clindamycin, Fluconazole, and Simvastatin  Current Home Medications:   No current facility-administered medications for this encounter.    alendronate (FOSAMAX) 70 MG tablet   ascorbic acid (VITAMIN C) 500 MG tablet   CALCIUM PO   celecoxib (CELEBREX) 100 MG capsule   Cholecalciferol (VITAMIN D3) 125 MCG (5000 UT) TABS   clotrimazole-betamethasone (LOTRISONE) cream   diltiazem (CARDIZEM CD) 120 MG 24 hr capsule   DULoxetine (CYMBALTA) 60 MG capsule   furosemide (LASIX) 40 MG tablet   insulin lispro (HUMALOG) 100 UNIT/ML injection   irbesartan (AVAPRO) 300 MG tablet   nitroGLYCERIN (NITROSTAT) 0.4 MG SL tablet   OZEMPIC, 2 MG/DOSE, 8 MG/3ML SOPN   pioglitazone (ACTOS) 45 MG tablet   rosuvastatin (CRESTOR) 40 MG tablet   spironolactone (ALDACTONE) 25 MG tablet   TOUJEO MAX SOLOSTAR 300 UNIT/ML Solostar Pen   vitamin B-12 (CYANOCOBALAMIN) 500 MCG tablet   History:   Past Medical History:  Diagnosis Date   Anemia    before hysterectomy   Anginal pain    Aortic atherosclerosis    Cervical myelopathy    Cervical spinal stenosis    Chronic heart failure with preserved ejection fraction (HFpEF)    a. 09/2021 Echo: EF 60-65%, no rwma, GrI DD, nl RV fxn, RVSP 47.70mmHg. Mildly dil LA; b. 10/2021 RHC: RA 15, RV 50/9, PA 48/21 (32), PCWP 21, LVEDP 27.   CKD (chronic kidney disease), stage III    DDD (degenerative disc disease), lumbar    Depression    Diabetes mellitus, type 2    DM retinopathy    Dyspnea on exertion    Edema    GERD (gastroesophageal reflux disease)    History of bilateral cataract extraction 2021   Hx of phlebitis    Hyperlipidemia    Hypertension    Lumbar stenosis    Mild allergic rhinitis    MRSA (methicillin resistant staph aureus) culture positive 03/2006   Nonobstructive CAD (coronary artery disease)    a. 12/2015 Cath: LAD min irregs,  otw nl cors; b. 08/2018 Cor CTA: Mid/Distal LAD and OM1 dzs w/ abnl ctFFR; c. 09/2018 Cath: LM nl, LAD 40p, 76m, D1 50, LCX small-nl, RCA nl, EF 55-65%; c. 10/2021 Cath: LM nl, LAD 40p/97m, lat D1 60, LCX sm nl, OM1 nl, RCA large, nl, RPDA/PAV/PL1-3 nl.   OA (osteoarthritis)    hands   Obesity    OSA on CPAP    Pulmonary HTN    a.) R/LHC 12/08/2015: mPA 25, mPCWP 15, AO sat 93.5, CO 6.69, CI 3.04; b.) TTE 09/21/2021: RVSP 47.8; c.) R/LHC 10/23/2021: mPA 32, mPCWP 21, PA sat 73, CO 11.5   Past Surgical History:  Procedure Laterality Date   ABDOMINAL HYSTERECTOMY     ADENOIDECTOMY     APPENDECTOMY  1989   CARDIAC CATHETERIZATION Bilateral 12/08/2015   Procedure: Right/Left Heart Cath and Coronary Angiography;  Surgeon: Iran Ouch, MD;  Location: [facility] INVASIVE CV LAB;  Service: Cardiovascular;  Laterality: Bilateral;   CATARACT EXTRACTION W/PHACO Left 05/12/2019   Procedure: CATARACT EXTRACTION PHACO AND INTRAOCULAR LENS PLACEMENT (IOC) LEFT VISION BLUE;  Surgeon: Elliot Cousin, MD;  Location: [facility];  Service: Ophthalmology;  Laterality: Left;  12.80 1:17.1   CATARACT EXTRACTION W/PHACO Right 06/11/2019  Procedure: CATARACT EXTRACTION PHACO AND INTRAOCULAR LENS PLACEMENT (IOC) RIGHT DIABETIC VISION BLUE 12.91  01:21.2;  Surgeon: Elliot Cousin, MD;  Location: [facility];  Service: Ophthalmology;  Laterality: Right;  Diabetic - insulin and oral meds   CHOLECYSTECTOMY  1989   LEFT HEART CATH AND CORONARY ANGIOGRAPHY Left 09/15/2018   Procedure: LEFT HEART CATH AND CORONARY ANGIOGRAPHY;  Surgeon: Iran Ouch, MD;  Location: [facility] INVASIVE CV LAB;  Service: Cardiovascular;  Laterality: Left;   LUMBAR LAMINECTOMY/DECOMPRESSION MICRODISCECTOMY N/A 11/25/2014   Procedure: LUMBAR LAMINECTOMY/DECOMPRESSION MICRODISCECTOMY LUMBAR TWO-THREE, LUMBAR THREE-FOUR ;  Surgeon: Tressie Stalker, MD;  Location: [facility];  Service: Neurosurgery;  Laterality: N/A;   RIGHT/LEFT  HEART CATH AND CORONARY ANGIOGRAPHY Bilateral 10/23/2021   Procedure: RIGHT/LEFT HEART CATH AND CORONARY ANGIOGRAPHY;  Surgeon: Iran Ouch, MD;  Location: [facility] INVASIVE CV LAB;  Service: Cardiovascular;  Laterality: Bilateral;   ROTATOR CUFF REPAIR Right 08/2001   Family History  Problem Relation Age of Onset   Cancer Mother 24       lung   Diabetes Mother    Alcohol abuse Mother    Alcohol abuse Father    Cancer Father        throat   Aneurysm Father    Diabetes Maternal Grandmother    Aneurysm Paternal Grandmother    Breast cancer Paternal Aunt        16's   Cancer Sister    Social History   Tobacco Use   Smoking status: Never   Smokeless tobacco: Never  Vaping Use   Vaping Use: Never used  Substance Use Topics   Alcohol use: No   Drug use: No    Pertinent Clinical Results:  LABS:   Component Ref Range & Units 04/17/2022  WBC 3.8 - 10.8 Thousand/uL 5.4  RBC 3.80 - 5.10 Million/uL 3.90  Hemoglobin 11.7 - 15.5 g/dL 07.6  Hematocrit 80.8 - 45.0 % 38.3  MCV 80.0 - 100.0 fL 98.2  MCH 27.0 - 33.0 pg 31.5  MCHC 32.0 - 36.0 g/dL 81.1  RDW 03.1 - 59.4 % 13.1  Platelets 140 - 400 Thousand/uL 217  MPV 7.5 - 12.5 fL 10.9  Neutrophils Absolute 1500 - 7800 cells/uL 4,018  Lymphocytes Absolute 850 - 3900 cells/uL 734 Low   Monocytes Absolute 200 - 950 cells/uL 410  Eosinophils Absolute 15 - 500 cells/uL 200  Basophils Absolute 0 - 200 cells/uL 38  Neutrophils Relative % 74.4  Lymphocytes % 13.6  Monocytes % 7.6  Eosinophils % 3.7  Basophils Relative % 0.7  Resulting Agency [facility]  Specimen Collected: 04/17/22 10:15   Performed by: Chancy Hurter Last Resulted: 04/18/22 12:02  Received From: [facility]  Result Received: 04/19/22 09:04   Component Ref Range & Units 05/08/2022  Glucose 70 - 110 mg/dL 585 High   Sodium 929 - 145 mmol/L 137  Potassium 3.6 - 5.1 mmol/L 4.9  Chloride 97 - 109 mmol/L 102  Carbon  Dioxide (CO2) 22.0 - 32.0 mmol/L 21.6 Low   Calcium 8.7 - 10.3 mg/dL 9.2  Urea Nitrogen (BUN) 7 - 25 mg/dL 53 High   Creatinine 0.6 - 1.1 mg/dL 2.1 High   Glomerular Filtration Rate (eGFR) >60 mL/min/1.73sq m 24 Low   BUN/Crea Ratio 6.0 - 20.0 25.2 High   Anion Gap w/K 6.0 - 16.0 18.3 High   Resulting Agency [facility]  Specimen Collected: 05/08/22 08:30   Performed by: [facility] Last Resulted: 05/08/22  10:23  Received From: [facility]  Result Received: 05/08/22 13:06   Lab Results  Component Value Date   HGBA1C 7.0 (H) 05/03/2022    ECG: Date: 10/31/2021 Time ECG obtained: 1422 PM Rate: 60 bpm Rhythm: normal sinus Axis (leads I and aVF): Normal Intervals: PR 182 ms. QRS 72 ms. QTc 404 ms. ST segment and T wave changes: No evidence of acute ST segment elevation or depression Comparison: Similar to previous tracing obtained on 08/24/2021   IMAGING / PROCEDURES: MR LUMBAR SPINE WO CONTRAST performed on 03/19/2022 Transitional anatomy, as above Multilevel degenerative changes of the lumbar spine, progressed at L1-L2 where there is severe canal stenosis with severe left and moderate right foraminal stenosis. Severe right and moderate-severe left foraminal stenosis at L2-L3. Moderate-severe bilateral foraminal stenosis at L3-L4.  MR CERVICAL SPINE WO CONTRAST performed on 03/19/2022 Progressive multilevel cervical spondylosis, most pronounced at the C4-5 and C5-6 levels where there is severe canal stenosis. Mild cord compression at C4-5. Severe right and moderate left foraminal stenosis at C5-6.   RIGHT/LEFT HEART CATHETERIZATION performed on 10/23/2021 Left ventricular angiography was not performed.  EF by echo was normal. Nonobstructive coronary artery disease 40% proximal LAD 30% mid LAD 50% lateral D1 Hemodynamics RA: 15 mmHg RV: 50/9 mmHg PW: 21 mmHg PA: 48/21 with a mean of 32 mmHg Pulmonary vascular  resistance is less than 1 Woods unit. LVEDP: 27 mmHg PA sat was 73% with a cardiac output of 11.5. Recommendations Continue medical therapy for nonobstructive coronary artery disease. Pulmonary hypertension is due to chronic diastolic heart failure. Elected to add spironolactone 25 mg once daily.  Continue furosemide 20 mg once daily.  Monitor renal function closely. Consider an SGLT2 inhibitor.   TRANSTHORACIC ECHOCARDIOGRAM performed on 09/21/2021 Left ventricular ejection fraction, by estimation, is 60 to 65%. The left ventricle has normal function. The left ventricle has no regional wall motion abnormalities. There is mild left ventricular hypertrophy. Left ventricular diastolic parameters are consistent with Grade I diastolic dysfunction (impaired relaxation).  Right ventricular systolic function is normal. The right ventricular size is normal. There is moderately elevated pulmonary artery systolic pressure. The estimated right ventricular systolic pressure is 47.8 mmHg.  Left atrial size was mildly dilated.  The mitral valve is normal in structure. No evidence of mitral valve regurgitation. No evidence of mitral stenosis.  The aortic valve was not well visualized. Aortic valve regurgitation is not visualized. No aortic stenosis is present.  The inferior vena cava is normal in size with greater than 50% respiratory variability, suggesting right atrial pressure of 3 mmHg.   CT CORONARY MORPH W/CTA COR W/SCORE W/CA W/CM &/OR WO/CM AND FRACTIONAL FLOW RESERVE DATA PREP performed on 09/02/2018 Calcium score 929 which is 96th percentile for age and sex involving RCA and LAD CAD-RADS 3 CAD see description above with worst lesions involving the proximal and mid LAD.  Severe mitral annular calcification Normal aortic root 3.2 cm with calcific atherosclerosis FFR analysis (normal range > 0.80): FFR CT abnormal in mid LAD 0.81 and distal LAD 0.71 FFR CT abnormal in OM1 0.77 FFR CT normal in  RCA  Impression and Plan:  [name] has been referred for pre-anesthesia review and clearance prior to her undergoing the planned anesthetic and procedural courses. Available labs, pertinent testing, and imaging results were personally reviewed by me in preparation for upcoming operative/procedural course. [facility] medical record has been updated following extensive record review and patient interview with PAT staff.  This patient has been appropriately cleared by cardiology with an overall HIGH (ACCEPTABLE) risk of significant perioperative cardiovascular complications. Based on clinical review performed today (05/14/22), barring any significant acute changes in the patient's overall condition, it is anticipated that she will be able to proceed with the planned surgical intervention. Any acute changes in clinical condition may necessitate her procedure being postponed and/or cancelled. Patient will meet with anesthesia team (MD and/or CRNA) on the day of her procedure for preoperative evaluation/assessment. Questions regarding anesthetic course will be fielded at that time.   Pre-surgical instructions were reviewed with the patient during her PAT appointment, and questions were fielded to satisfaction by PAT clinical staff. She has been instructed on which medications that she will need to hold prior to surgery, as well as the ones that have been deemed safe/appropriate to take of the day of her procedure. As part of the general education provided by PAT, patient made aware both verbally and in writing, that she would need to abstain from the use of any illegal substances during her perioperative course.  She was advised that failure to follow the provided instructions could necessitate case cancellation or result serious perioperative complications up to and including death. Patient encouraged to contact PAT and/or her surgeon's office to discuss any questions or concerns that may arise prior to  surgery; verbalized understanding.   Quentin Mulling, MSN, APRN, FNP-C, CEN [facility]  Peri-operative Services Nurse Practitioner Phone: [phone] Fax: [phone] 05/14/22 8:17 AM  NOTE: This note has been prepared using Dragon dictation software. Despite my best ability to proofread, there is always the potential that unintentional transcriptional errors may still occur from this process.

## 2022-05-15 MED ORDER — FAMOTIDINE 20 MG PO TABS
20.0000 mg | ORAL_TABLET | Freq: Once | ORAL | Status: AC
  Administered 2022-05-16: 20 mg via ORAL

## 2022-05-15 MED ORDER — ORAL CARE MOUTH RINSE: 15.0000 mL | Freq: Once | OROMUCOSAL | Status: AC

## 2022-05-15 MED ORDER — CEFAZOLIN IN SODIUM CHLORIDE 2-0.9 GM/100ML-% IV SOLN
2.0000 g | Freq: Once | INTRAVENOUS | Status: DC
  Filled 2022-05-15: qty 100

## 2022-05-15 MED ORDER — CEFAZOLIN SODIUM-DEXTROSE 2-4 GM/100ML-% IV SOLN
2.0000 g | INTRAVENOUS | Status: AC
  Administered 2022-05-16: 2 g via INTRAVENOUS

## 2022-05-15 MED ORDER — ACETAMINOPHEN 500 MG PO TABS: 1000.0000 mg | ORAL_TABLET | Freq: Once | ORAL | Status: DC

## 2022-05-15 MED ORDER — SODIUM CHLORIDE 0.9 % IV SOLN: INTRAVENOUS | Status: DC

## 2022-05-15 MED ORDER — CHLORHEXIDINE GLUCONATE 0.12 % MT SOLN
15.0000 mL | Freq: Once | OROMUCOSAL | Status: AC
  Administered 2022-05-16: 15 mL via OROMUCOSAL

## 2022-05-15 NOTE — Anesthesia Preprocedure Evaluation (Signed)
Anesthesia Evaluation  Patient identified by MRN, date of birth, ID band Patient awake    Reviewed: NPO status   History of Anesthesia Complications Negative for: history of anesthetic complications  Airway Mallampati: IV  TM Distance: >3 FB Neck ROM: full    Dental  (+) Caps   Pulmonary sleep apnea and Continuous Positive Airway Pressure Ventilation    Pulmonary exam normal        Cardiovascular Exercise Tolerance: Good hypertension, pulmonary hypertension(-) angina + CAD  Normal cardiovascular exam  10/2021 Cath: LM nl, LAD 40p/55m, lat D1 60, LCX sm nl, OM1 nl, RCA large, nl, RPDA/PAV/PL1-3 nl.   09/2021 Echo: EF 60-65%, no rwma, GrI DD, nl RV fxn, RVSP 47.84mmHg. Mildly dil LA; b. 10/2021 RHC: RA 15, RV 50/9, PA 48/21 (32), PCWP 21, LVEDP 27.  Her functional capacity is good at 5.07 METs according to the Duke Activity Status Index (DASI) per cardiology visit report   Neuro/Psych  PSYCHIATRIC DISORDERS  Depression    Cervical myelopathy  Neuromuscular disease    GI/Hepatic Neg liver ROS,GERD  Controlled,,  Endo/Other  diabetes, Well Controlled, Type 2  Morbid obesityA1C 7  Renal/GU Renal InsufficiencyRenal disease  negative genitourinary   Musculoskeletal  (+) Arthritis ,    Abdominal  (+) + obese  Peds  Hematology negative hematology ROS (+)   Anesthesia Other Findings      Reproductive/Obstetrics                              Anesthesia Physical Anesthesia Plan  ASA: III  Anesthesia Plan: General   Post-op Pain Management: Ofirmev IV (intra-op)* and Regional block*   Induction: Intravenous  PONV Risk Score and Plan: 2 and Dexamethasone, Ondansetron and Treatment may vary due to age or medical condition  Airway Management Planned: Oral ETT  Additional Equipment:   Intra-op Plan:   Post-operative Plan: Extubation in OR  Informed Consent: I have reviewed the patients  History and Physical, chart, labs and discussed the procedure including the risks, benefits and alternatives for the proposed anesthesia with the patient or authorized representative who has indicated his/her understanding and acceptance.       Plan Discussed with: CRNA  Anesthesia Plan Comments:          Anesthesia Quick Evaluation

## 2022-05-16 ENCOUNTER — Other Ambulatory Visit: Payer: Self-pay

## 2022-05-16 ENCOUNTER — Ambulatory Visit: Payer: Medicare Other

## 2022-05-16 ENCOUNTER — Encounter
Admission: RE | Disposition: A | Discharge status: Home / Self Care | Payer: Self-pay | Source: Home / Self Care | Attending: Neurosurgery

## 2022-05-16 ENCOUNTER — Encounter: Payer: Self-pay | Admitting: Neurosurgery

## 2022-05-16 ENCOUNTER — Observation Stay
Admission: RE | Admit: 2022-05-16 | Discharge: 2022-05-17 | Disposition: A | Discharge status: Home / Self Care | Payer: Medicare Other | Attending: Neurosurgery | Admitting: Neurosurgery

## 2022-05-16 ENCOUNTER — Ambulatory Visit: Payer: Medicare Other | Admitting: Urgent Care

## 2022-05-16 DIAGNOSIS — E119 Type 2 diabetes mellitus without complications: Secondary | ICD-10-CM | POA: Insufficient documentation

## 2022-05-16 DIAGNOSIS — I2510 Atherosclerotic heart disease of native coronary artery without angina pectoris: Secondary | ICD-10-CM | POA: Diagnosis not present

## 2022-05-16 DIAGNOSIS — I5032 Chronic diastolic (congestive) heart failure: Secondary | ICD-10-CM | POA: Insufficient documentation

## 2022-05-16 DIAGNOSIS — M4802 Spinal stenosis, cervical region: Secondary | ICD-10-CM | POA: Diagnosis not present

## 2022-05-16 DIAGNOSIS — I110 Hypertensive heart disease with heart failure: Secondary | ICD-10-CM | POA: Insufficient documentation

## 2022-05-16 DIAGNOSIS — Z79899 Other long term (current) drug therapy: Secondary | ICD-10-CM | POA: Insufficient documentation

## 2022-05-16 DIAGNOSIS — G959 Disease of spinal cord, unspecified: Principal | ICD-10-CM | POA: Insufficient documentation

## 2022-05-16 DIAGNOSIS — Z01818 Encounter for other preprocedural examination: Secondary | ICD-10-CM

## 2022-05-16 HISTORY — DX: Other forms of dyspnea: R06.09

## 2022-05-16 HISTORY — DX: Spinal stenosis, cervical region: M48.02

## 2022-05-16 HISTORY — PX: ANTERIOR CERVICAL DECOMP/DISCECTOMY FUSION: SHX1161

## 2022-05-16 HISTORY — DX: Disease of spinal cord, unspecified: G95.9

## 2022-05-16 HISTORY — DX: Spinal stenosis, lumbar region without neurogenic claudication: M48.061

## 2022-05-16 HISTORY — DX: Obstructive sleep apnea (adult) (pediatric): G47.33

## 2022-05-16 LAB — GLUCOSE, CAPILLARY
Glucose-Capillary: 159 mg/dL — ABNORMAL HIGH (ref 70–99)
Glucose-Capillary: 158 mg/dL — ABNORMAL HIGH (ref 70–99)
Glucose-Capillary: 312 mg/dL — ABNORMAL HIGH (ref 70–99)

## 2022-05-16 LAB — ABO/RH: ABO/RH(D): A POS

## 2022-05-16 SURGERY — ANTERIOR CERVICAL DECOMPRESSION/DISCECTOMY FUSION 2 LEVELS
Anesthesia: General | Site: Spine Cervical

## 2022-05-16 MED ORDER — ACETAMINOPHEN 325 MG PO TABS: 650.0000 mg | ORAL_TABLET | ORAL | Status: DC | PRN

## 2022-05-16 MED ORDER — DEXAMETHASONE SODIUM PHOSPHATE 10 MG/ML IJ SOLN
INTRAMUSCULAR | Status: DC | PRN
  Administered 2022-05-16: 5 mg via INTRAVENOUS

## 2022-05-16 MED ORDER — HYDROMORPHONE HCL 1 MG/ML IJ SOLN
INTRAMUSCULAR | Status: AC
  Filled 2022-05-16: qty 1

## 2022-05-16 MED ORDER — FENTANYL CITRATE (PF) 100 MCG/2ML IJ SOLN
INTRAMUSCULAR | Status: DC | PRN
  Administered 2022-05-16 (×2): 50 ug via INTRAVENOUS

## 2022-05-16 MED ORDER — HYDROCODONE-ACETAMINOPHEN 5-325 MG PO TABS: 1.0000 | ORAL_TABLET | ORAL | Status: DC | PRN

## 2022-05-16 MED ORDER — SUCCINYLCHOLINE CHLORIDE 200 MG/10ML IV SOSY
PREFILLED_SYRINGE | INTRAVENOUS | Status: AC
  Filled 2022-05-16: qty 10

## 2022-05-16 MED ORDER — PROPOFOL 500 MG/50ML IV EMUL
INTRAVENOUS | Status: DC | PRN
  Administered 2022-05-16: 100 ug/kg/min via INTRAVENOUS

## 2022-05-16 MED ORDER — SODIUM CHLORIDE 0.9% FLUSH
3.0000 mL | Freq: Two times a day (BID) | INTRAVENOUS | Status: DC
  Administered 2022-05-16: 3 mL via INTRAVENOUS

## 2022-05-16 MED ORDER — ENOXAPARIN SODIUM 30 MG/0.3ML IJ SOSY
30.0000 mg | PREFILLED_SYRINGE | INTRAMUSCULAR | Status: DC
  Administered 2022-05-17: 30 mg via SUBCUTANEOUS

## 2022-05-16 MED ORDER — PROPOFOL 10 MG/ML IV BOLUS
INTRAVENOUS | Status: DC | PRN
  Administered 2022-05-16: 20 mg via INTRAVENOUS
  Administered 2022-05-16: 150 mg via INTRAVENOUS

## 2022-05-16 MED ORDER — LIDOCAINE HCL (CARDIAC) PF 100 MG/5ML IV SOSY
PREFILLED_SYRINGE | INTRAVENOUS | Status: DC | PRN
  Administered 2022-05-16: 100 mg via INTRAVENOUS

## 2022-05-16 MED ORDER — PIOGLITAZONE HCL 30 MG PO TABS: 45.0000 mg | ORAL_TABLET | ORAL | Status: DC

## 2022-05-16 MED ORDER — PROPOFOL 1000 MG/100ML IV EMUL
INTRAVENOUS | Status: AC
  Filled 2022-05-16: qty 100

## 2022-05-16 MED ORDER — SODIUM CHLORIDE 0.9 % IV SOLN
INTRAVENOUS | Status: DC | PRN
  Administered 2022-05-16: .1 ug/kg/min via INTRAVENOUS

## 2022-05-16 MED ORDER — ONDANSETRON HCL 4 MG/2ML IJ SOLN: 4.0000 mg | Freq: Four times a day (QID) | INTRAMUSCULAR | Status: DC | PRN

## 2022-05-16 MED ORDER — PHENYLEPHRINE HCL-NACL 20-0.9 MG/250ML-% IV SOLN
INTRAVENOUS | Status: AC
  Filled 2022-05-16: qty 250

## 2022-05-16 MED ORDER — FUROSEMIDE 20 MG PO TABS: 20.0000 mg | ORAL_TABLET | ORAL | Status: DC | PRN

## 2022-05-16 MED ORDER — PROPOFOL 10 MG/ML IV BOLUS
INTRAVENOUS | Status: AC
  Filled 2022-05-16: qty 20

## 2022-05-16 MED ORDER — ONDANSETRON HCL 4 MG PO TABS: 4.0000 mg | ORAL_TABLET | Freq: Four times a day (QID) | ORAL | Status: DC | PRN

## 2022-05-16 MED ORDER — HYDROMORPHONE HCL 1 MG/ML IJ SOLN
0.2500 mg | INTRAMUSCULAR | Status: DC | PRN
  Administered 2022-05-16 (×4): 0.5 mg via INTRAVENOUS

## 2022-05-16 MED ORDER — ORAL CARE MOUTH RINSE: 15.0000 mL | OROMUCOSAL | Status: DC | PRN

## 2022-05-16 MED ORDER — ONDANSETRON HCL 4 MG/2ML IJ SOLN
INTRAMUSCULAR | Status: DC | PRN
  Administered 2022-05-16: 4 mg via INTRAVENOUS

## 2022-05-16 MED ORDER — SODIUM CHLORIDE 0.9 % IV SOLN: 250.0000 mL | INTRAVENOUS | Status: DC

## 2022-05-16 MED ORDER — IRBESARTAN 150 MG PO TABS
300.0000 mg | ORAL_TABLET | ORAL | Status: DC
  Administered 2022-05-17: 300 mg via ORAL
  Filled 2022-05-16: qty 2

## 2022-05-16 MED ORDER — KETAMINE HCL 50 MG/5ML IJ SOSY
PREFILLED_SYRINGE | INTRAMUSCULAR | Status: AC
  Filled 2022-05-16: qty 5

## 2022-05-16 MED ORDER — HYDROCODONE-ACETAMINOPHEN 5-325 MG PO TABS: 2.0000 | ORAL_TABLET | ORAL | Status: DC | PRN

## 2022-05-16 MED ORDER — BUPIVACAINE-EPINEPHRINE (PF) 0.5% -1:200000 IJ SOLN
INTRAMUSCULAR | Status: AC
  Filled 2022-05-16: qty 30

## 2022-05-16 MED ORDER — BUPIVACAINE-EPINEPHRINE (PF) 0.5% -1:200000 IJ SOLN
INTRAMUSCULAR | Status: DC | PRN
  Administered 2022-05-16: 4 mL via PERINEURAL

## 2022-05-16 MED ORDER — REMIFENTANIL HCL 1 MG IV SOLR
INTRAVENOUS | Status: AC
  Filled 2022-05-16: qty 1000

## 2022-05-16 MED ORDER — DEXMEDETOMIDINE HCL IN NACL 200 MCG/50ML IV SOLN
INTRAVENOUS | Status: DC | PRN
  Administered 2022-05-16: 8 ug via INTRAVENOUS
  Administered 2022-05-16: 4 ug via INTRAVENOUS
  Administered 2022-05-16: 8 ug via INTRAVENOUS

## 2022-05-16 MED ORDER — SPIRONOLACTONE 25 MG PO TABS
25.0000 mg | ORAL_TABLET | ORAL | Status: DC
  Administered 2022-05-17: 25 mg via ORAL
  Filled 2022-05-16: qty 1

## 2022-05-16 MED ORDER — SODIUM CHLORIDE 0.9% FLUSH: 3.0000 mL | INTRAVENOUS | Status: DC | PRN

## 2022-05-16 MED ORDER — ACETAMINOPHEN 10 MG/ML IV SOLN
INTRAVENOUS | Status: AC
  Filled 2022-05-16: qty 100

## 2022-05-16 MED ORDER — EPHEDRINE SULFATE (PRESSORS) 50 MG/ML IJ SOLN
INTRAMUSCULAR | Status: DC | PRN
  Administered 2022-05-16 (×2): 10 mg via INTRAVENOUS
  Administered 2022-05-16: 5 mg via INTRAVENOUS

## 2022-05-16 MED ORDER — METHOCARBAMOL 1000 MG/10ML IJ SOLN
500.0000 mg | Freq: Four times a day (QID) | INTRAVENOUS | Status: DC | PRN
  Administered 2022-05-16: 500 mg via INTRAVENOUS
  Filled 2022-05-16: qty 5

## 2022-05-16 MED ORDER — SENNA 8.6 MG PO TABS
ORAL_TABLET | ORAL | Status: AC
  Filled 2022-05-16: qty 1

## 2022-05-16 MED ORDER — SENNA 8.6 MG PO TABS
1.0000 | ORAL_TABLET | Freq: Two times a day (BID) | ORAL | Status: DC
  Administered 2022-05-16 – 2022-05-17 (×2): 8.6 mg via ORAL

## 2022-05-16 MED ORDER — ONDANSETRON HCL 4 MG/2ML IJ SOLN
INTRAMUSCULAR | Status: AC
  Filled 2022-05-16: qty 2

## 2022-05-16 MED ORDER — PHENYLEPHRINE HCL-NACL 20-0.9 MG/250ML-% IV SOLN
INTRAVENOUS | Status: DC | PRN
  Administered 2022-05-16: 25 ug/min via INTRAVENOUS

## 2022-05-16 MED ORDER — INSULIN GLARGINE-YFGN 100 UNIT/ML ~~LOC~~ SOLN
40.0000 [IU] | Freq: Every day | SUBCUTANEOUS | Status: DC
  Administered 2022-05-16: 40 [IU] via SUBCUTANEOUS
  Filled 2022-05-16 (×2): qty 0.4

## 2022-05-16 MED ORDER — SUCCINYLCHOLINE CHLORIDE 200 MG/10ML IV SOSY
PREFILLED_SYRINGE | INTRAVENOUS | Status: DC | PRN
  Administered 2022-05-16: 100 mg via INTRAVENOUS

## 2022-05-16 MED ORDER — CEFAZOLIN SODIUM-DEXTROSE 2-4 GM/100ML-% IV SOLN
INTRAVENOUS | Status: AC
  Filled 2022-05-16: qty 100

## 2022-05-16 MED ORDER — INSULIN GLARGINE 100 UNIT/ML ~~LOC~~ SOLN
40.0000 [IU] | Freq: Every day | SUBCUTANEOUS | Status: DC
  Filled 2022-05-16: qty 0.4

## 2022-05-16 MED ORDER — LIDOCAINE HCL (PF) 2 % IJ SOLN
INTRAMUSCULAR | Status: AC
  Filled 2022-05-16: qty 5

## 2022-05-16 MED ORDER — MENTHOL 3 MG MT LOZG: 1.0000 | LOZENGE | OROMUCOSAL | Status: DC | PRN

## 2022-05-16 MED ORDER — 0.9 % SODIUM CHLORIDE (POUR BTL) OPTIME
TOPICAL | Status: DC | PRN
  Administered 2022-05-16: 500 mL

## 2022-05-16 MED ORDER — PHENOL 1.4 % MT LIQD: 1.0000 | OROMUCOSAL | Status: DC | PRN

## 2022-05-16 MED ORDER — PROMETHAZINE HCL 25 MG/ML IJ SOLN: 6.2500 mg | INTRAMUSCULAR | Status: DC | PRN

## 2022-05-16 MED ORDER — DROPERIDOL 2.5 MG/ML IJ SOLN: 0.6250 mg | Freq: Once | INTRAMUSCULAR | Status: DC | PRN

## 2022-05-16 MED ORDER — KETAMINE HCL 10 MG/ML IJ SOLN
INTRAMUSCULAR | Status: DC | PRN
  Administered 2022-05-16: 20 mg via INTRAVENOUS

## 2022-05-16 MED ORDER — ACETAMINOPHEN 650 MG RE SUPP: 650.0000 mg | RECTAL | Status: DC | PRN

## 2022-05-16 MED ORDER — SURGIFLO WITH THROMBIN (HEMOSTATIC MATRIX KIT) OPTIME
TOPICAL | Status: DC | PRN
  Administered 2022-05-16: 1 via TOPICAL

## 2022-05-16 MED ORDER — ACETAMINOPHEN 10 MG/ML IV SOLN
1000.0000 mg | Freq: Once | INTRAVENOUS | Status: DC | PRN
  Administered 2022-05-16: 1000 mg via INTRAVENOUS

## 2022-05-16 MED ORDER — METHOCARBAMOL 500 MG PO TABS: 500.0000 mg | ORAL_TABLET | Freq: Four times a day (QID) | ORAL | Status: DC | PRN

## 2022-05-16 MED ORDER — SODIUM CHLORIDE 0.9 % IV SOLN: INTRAVENOUS | Status: DC

## 2022-05-16 MED ORDER — INSULIN ASPART 100 UNIT/ML IJ SOLN
0.0000 [IU] | Freq: Three times a day (TID) | INTRAMUSCULAR | Status: DC
  Administered 2022-05-17: 7 [IU] via SUBCUTANEOUS
  Administered 2022-05-17: 11 [IU] via SUBCUTANEOUS

## 2022-05-16 MED ORDER — ROSUVASTATIN CALCIUM 20 MG PO TABS
40.0000 mg | ORAL_TABLET | Freq: Every day | ORAL | Status: DC
  Administered 2022-05-17: 40 mg via ORAL

## 2022-05-16 MED ORDER — HEMOSTATIC AGENTS (NO CHARGE) OPTIME
TOPICAL | Status: DC | PRN
  Administered 2022-05-16: 1 via TOPICAL

## 2022-05-16 MED ORDER — DULOXETINE HCL 60 MG PO CPEP
60.0000 mg | ORAL_CAPSULE | ORAL | Status: DC
  Administered 2022-05-17: 60 mg via ORAL
  Filled 2022-05-16: qty 1

## 2022-05-16 MED ORDER — NITROGLYCERIN 0.4 MG SL SUBL: 0.4000 mg | SUBLINGUAL_TABLET | SUBLINGUAL | Status: DC | PRN

## 2022-05-16 MED ORDER — DILTIAZEM HCL ER COATED BEADS 120 MG PO CP24
120.0000 mg | ORAL_CAPSULE | ORAL | Status: DC
  Administered 2022-05-17: 120 mg via ORAL
  Filled 2022-05-16: qty 1

## 2022-05-16 MED ORDER — CHLORHEXIDINE GLUCONATE 0.12 % MT SOLN
OROMUCOSAL | Status: AC
  Filled 2022-05-16: qty 15

## 2022-05-16 MED ORDER — FENTANYL CITRATE (PF) 100 MCG/2ML IJ SOLN
INTRAMUSCULAR | Status: AC
  Filled 2022-05-16: qty 2

## 2022-05-16 MED ORDER — FAMOTIDINE 20 MG PO TABS
ORAL_TABLET | ORAL | Status: AC
  Filled 2022-05-16: qty 1

## 2022-05-16 SURGICAL SUPPLY — 60 items
ADH SKN CLS APL DERMABOND .7 (GAUZE/BANDAGES/DRESSINGS) ×1
AGENT HMST KT MTR STRL THRMB (HEMOSTASIS) ×1
ALLOGRAFT BONE FIBER KORE 1CC (Bone Implant) IMPLANT
BASIN KIT SINGLE STR (MISCELLANEOUS) ×1 IMPLANT
BASKET BONE COLLECTION (BASKET) IMPLANT
BULB RESERV EVAC DRAIN JP 100C (MISCELLANEOUS) IMPLANT
BUR MATCHSTICK NEURO 3.0 LAGG (BURR) IMPLANT
BUR NEURO DRILL SOFT 3.0X3.8M (BURR) ×1 IMPLANT
DERMABOND ADVANCED .7 DNX12 (GAUZE/BANDAGES/DRESSINGS) ×1 IMPLANT
DRAIN CHANNEL JP 10F RND 20C F (MISCELLANEOUS) IMPLANT
DRAIN RND TROCAR 1/8FLUTED 10F (DRAIN) IMPLANT
DRAPE C ARM PK CFD 31 SPINE (DRAPES) ×1 IMPLANT
DRAPE LAPAROTOMY 77X122 PED (DRAPES) ×1 IMPLANT
DRAPE MICROSCOPE SPINE 48X150 (DRAPES) ×1 IMPLANT
DRSG OPSITE POSTOP 4X6 (GAUZE/BANDAGES/DRESSINGS) ×2 IMPLANT
ELECT REM PT RETURN 9FT ADLT (ELECTROSURGICAL) ×1
ELECTRODE REM PT RTRN 9FT ADLT (ELECTROSURGICAL) ×1 IMPLANT
FEE INTRAOP CADWELL SUPPLY NCS (MISCELLANEOUS) IMPLANT
FEE INTRAOP MONITOR IMPULS NCS (MISCELLANEOUS) IMPLANT
GLOVE BIOGEL PI IND STRL 6.5 (GLOVE) ×2 IMPLANT
GLOVE SURG SYN 6.5 ES PF (GLOVE) ×2 IMPLANT
GLOVE SURG SYN 6.5 PF PI (GLOVE) ×2 IMPLANT
GLOVE SURG SYN 8.5  E (GLOVE) ×3
GLOVE SURG SYN 8.5 E (GLOVE) ×3 IMPLANT
GLOVE SURG SYN 8.5 PF PI (GLOVE) ×3 IMPLANT
GOWN SRG LRG LVL 4 IMPRV REINF (GOWNS) ×2 IMPLANT
GOWN SRG XL LVL 3 NONREINFORCE (GOWNS) ×1 IMPLANT
GOWN STRL NON-REIN TWL XL LVL3 (GOWNS) ×1
GOWN STRL REIN LRG LVL4 (GOWNS) ×2
GRAFT DURAGEN MATRIX 1WX1L (Tissue) IMPLANT
HEMOSTAT SURGICEL 2X3 (HEMOSTASIS) IMPLANT
INTRAOP CADWELL SUPPLY FEE NCS (MISCELLANEOUS) ×1
INTRAOP DISP SUPPLY FEE NCS (MISCELLANEOUS) ×1
INTRAOP MONITOR FEE IMPULS NCS (MISCELLANEOUS) ×1
INTRAOP MONITOR FEE IMPULSE (MISCELLANEOUS) ×1
KIT TURNOVER KIT A (KITS) ×1 IMPLANT
MANIFOLD NEPTUNE II (INSTRUMENTS) ×1 IMPLANT
NDL SAFETY ECLIP 18X1.5 (MISCELLANEOUS) ×1 IMPLANT
NS IRRIG 1000ML POUR BTL (IV SOLUTION) ×1 IMPLANT
NS IRRIG 500ML POUR BTL (IV SOLUTION) IMPLANT
PACK LAMINECTOMY NEURO (CUSTOM PROCEDURE TRAY) ×1 IMPLANT
PAD ARMBOARD 7.5X6 YLW CONV (MISCELLANEOUS) ×2 IMPLANT
PIN CASPAR 14 (PIN) ×1 IMPLANT
PIN CASPAR 14MM (PIN) ×1
PLATE ACP 1.6V PLATE 34 2L (Plate) IMPLANT
PUTTY DBX 1CC (Putty) IMPLANT
SCREW ACP VA SD 3.5X15 (Screw) IMPLANT
SCREW VA ST 4.0X15 (Screw) IMPLANT
SPACER C HEDRON 12X14 7M 7D (Spacer) IMPLANT
SPONGE KITTNER 5P (MISCELLANEOUS) ×1 IMPLANT
STAPLER SKIN PROX 35W (STAPLE) IMPLANT
SURGIFLO W/THROMBIN 8M KIT (HEMOSTASIS) ×1 IMPLANT
SUT ETHILON 3-0 (SUTURE) IMPLANT
SUT V-LOC 90 ABS DVC 3-0 CL (SUTURE) ×1 IMPLANT
SUT VIC AB 3-0 SH 8-18 (SUTURE) ×1 IMPLANT
SUT VICRYL 3-0 CR8 SH (SUTURE) ×1 IMPLANT
SYR 20ML LL LF (SYRINGE) ×1 IMPLANT
TAPE CLOTH 3X10 WHT NS LF (GAUZE/BANDAGES/DRESSINGS) ×3 IMPLANT
TRAP FLUID SMOKE EVACUATOR (MISCELLANEOUS) ×1 IMPLANT
TRAY FOLEY MTR SLVR 16FR STAT (SET/KITS/TRAYS/PACK) IMPLANT

## 2022-05-16 NOTE — Progress Notes (Signed)
Pt lying in bed with eyes closed.  VSS, NAD.

## 2022-05-16 NOTE — Anesthesia Procedure Notes (Signed)
Procedure Name: Intubation Date/Time: 05/16/2022 12:04 PM  Performed by: Omer Jack, CRNAPre-anesthesia Checklist: Patient identified, Patient being monitored, Timeout performed, Emergency Drugs available and Suction available Patient Re-evaluated:Patient Re-evaluated prior to induction Oxygen Delivery Method: Circle system utilized Preoxygenation: Pre-oxygenation with 100% oxygen Induction Type: IV induction Ventilation: Mask ventilation without difficulty Laryngoscope Size: 3 and McGraph Grade View: Grade I Tube type: Oral Tube size: 6.5 mm Number of attempts: 2 Airway Equipment and Method: Stylet Placement Confirmation: ETT inserted through vocal cords under direct vision, positive ETCO2 and breath sounds checked- equal and bilateral Secured at: 21 cm Tube secured with: Tape Dental Injury: Teeth and Oropharynx as per pre-operative assessment

## 2022-05-16 NOTE — Anesthesia Postprocedure Evaluation (Signed)
Anesthesia Post Note  Patient: [name]  Procedure(s) Performed: C4-6 ANTERIOR CERVICAL DISCECTOMY AND FUSION (Spine Cervical)  Patient location during evaluation: PACU Anesthesia Type: General Level of consciousness: awake and alert Pain management: pain level controlled Vital Signs Assessment: post-procedure vital signs reviewed and stable Respiratory status: spontaneous breathing, nonlabored ventilation, respiratory function stable and patient connected to nasal cannula oxygen Cardiovascular status: blood pressure returned to baseline and stable Postop Assessment: no apparent nausea or vomiting Anesthetic complications: no  No notable events documented.   Last Vitals:  Vitals:   05/16/22 1438 05/16/22 1442  BP: 128/76   Pulse:  70  Resp: 18 (!) 26  Temp:    SpO2: 99% 100%    Last Pain:  Vitals:   05/16/22 1442  TempSrc:   PainSc: 7                  Stephanie Coup

## 2022-05-16 NOTE — Transfer of Care (Signed)
Immediate Anesthesia Transfer of Care Note  Patient: [name]  Procedure(s) Performed: C4-6 ANTERIOR CERVICAL DISCECTOMY AND FUSION (Spine Cervical)  Patient Location: PACU  Anesthesia Type:General  Level of Consciousness: drowsy and patient cooperative  Airway & Oxygen Therapy: Patient Spontanous Breathing and Patient connected to nasal cannula oxygen  Post-op Assessment: Report given to RN and Post -op Vital signs reviewed and stable  Post vital signs: Reviewed and stable  Last Vitals:  Vitals Value Taken Time  BP 128/76 05/16/22 1438  Temp 36.1 C 05/16/22 1432  Pulse 71 05/16/22 1439  Resp 19 05/16/22 1439  SpO2 100 % 05/16/22 1439  Vitals shown include unvalidated device data.  Last Pain:  Vitals:   05/16/22 1048  TempSrc: Temporal  PainSc: 0-No pain         Complications: No notable events documented.

## 2022-05-16 NOTE — Interval H&P Note (Signed)
History and Physical Interval Note:  05/16/2022 11:11 AM  Monica Ray  has presented today for surgery, with the diagnosis of G95.9 Cervical myelopathy M48.02 Cervical stenosis.  The various methods of treatment have been discussed with the patient and family. After consideration of risks, benefits and other options for treatment, the patient has consented to  Procedure(s): C4-6 ANTERIOR CERVICAL DISCECTOMY AND FUSION (N/A) as a surgical intervention.  The patient's history has been reviewed, patient examined, no change in status, stable for surgery.  I have reviewed the patient's chart and labs.  Questions were answered to the patient's satisfaction.    Heart sounds normal no MRG. Chest Clear to Auscultation Bilaterally.   Dana Dorner

## 2022-05-16 NOTE — Op Note (Signed)
Indications: [name] is suffering from  G95.9 Cervical myelopathy, M48.02 Cervical stenosis . The patient tried and failed conservative management, prompting surgical intervention.  Findings: stenosis  Preoperative Diagnosis:  G95.9 Cervical myelopathy, M48.02 Cervical stenosis  Postoperative Diagnosis: same   EBL: 25 ml IVF: see AR ml Drains: 1 placed Disposition: Extubated and Stable to PACU Complications: none  No foley catheter was placed.   Preoperative Note:   Risks of surgery discussed include: infection, bleeding, stroke, coma, death, paralysis, CSF leak, nerve/spinal cord injury, numbness, tingling, weakness, complex regional pain syndrome, recurrent stenosis and/or disc herniation, vascular injury, development of instability, neck/back pain, need for further surgery, persistent symptoms, development of deformity, and the risks of anesthesia. The patient understood these risks and agreed to proceed.  Operative Note:   Procedure:  1) Anterior cervical diskectomy and fusion at C4/5 and C5/6 2) Anterior cervical instrumentation at C4 - 6 3) Placement of biomechanical devices at C4/5 and C5/6  4) Use of operative microscope 5) Use of flouroscopy   Procedure: After obtaining informed consent, the patient taken to the operating room, placed in supine position, general anesthesia induced.  The patient had a small shoulder roll placed behind their shoulders.  The patient received preop antibiotics and IV Decadron.  The patient had a neck incision outlined, was prepped and draped in usual sterile fashion. The incision was injected with local anesthetic.   An incision was opened, dissection taken down medial to the carotid artery and jugular vein, lateral to the trachea and esophagus.  The prevertebral fascia identified and a localizing x-ray demonstrated the correct level.  The longus colli were dissected laterally, and self-retaining retractors placed to open the operative  field. The microscope was then brought into the field.  With this complete, distractor pins were placed in the vertebral bodies of C4 and C6. The distractor was placed, and the annuli at C4/5 and C5/6 were opened using a bovie.  Curettes and pituitary rongeurs used to remove the majority of disk, then the drill was used to remove the posterior osteophyte and begin the foraminotomies. The nerve hook was used to elevate the posterior longitudinal ligament, which was then removed with Kerrison rongeurs. The microblunt nerve hook could be passed out the foramina bilaterally at each level.   Meticulous hemostasis was obtained.  A biomechanical device (Globus Hedron 7 mm height x 14 mm width by 12 mm depth) was placed at C4/5. A second biomechanical device (Globus Hedron 7 mm height x 14 mm width by 12 mm depth) was placed at C5/6. Each device had been filled with allograft for aid in arthrodesis.  The caspar distractor was removed, and bone wax used for hemostasis. A separate, 34 mm Nuvasive ACP plate was chosen.  Two screws placed in each vertebral body, respectively making sure the screws were behind the locking mechanism.  Final AP and lateral radiographs were taken.   Please note that the plate is not inclusive to the biomechanical devices.  The anchoring mechanism of the plate is completely separate from the biomechanical devices.  With everything in good position, the wound was irrigated copiously and meticulous hemostasis obtained.  Wound was closed in 2 layers using interrupted inverted 3-0 Vicryl sutures in the platysma and 3-0 monocryl on the dermis.  The wound was dressed with dermabond, the head of bed at 30 degrees, taken to recovery room in stable condition.  No new postop neurological deficits were identified.  Sponge and pattie counts were correct at  the end of the procedure.     I performed the entire procedure with the assistance of Drake Leach PA as an Designer, television/film set. An assistant was  required for this procedure due to the complexity.  The assistant provided assistance in tissue manipulation and suction, and was required for the successful and safe performance of the procedure. I performed the critical portions of the procedure.   Venetia Night MD

## 2022-05-17 DIAGNOSIS — G959 Disease of spinal cord, unspecified: Secondary | ICD-10-CM | POA: Diagnosis not present

## 2022-05-17 LAB — GLUCOSE, CAPILLARY
Glucose-Capillary: 262 mg/dL — ABNORMAL HIGH (ref 70–99)
Glucose-Capillary: 216 mg/dL — ABNORMAL HIGH (ref 70–99)

## 2022-05-17 MED ORDER — INSULIN ASPART 100 UNIT/ML IJ SOLN
INTRAMUSCULAR | Status: AC
  Filled 2022-05-17: qty 1

## 2022-05-17 MED ORDER — HYDROCODONE-ACETAMINOPHEN 5-325 MG PO TABS: 1.0000 | ORAL_TABLET | ORAL | 0 refills | Status: DC | PRN

## 2022-05-17 MED ORDER — SENNA 8.6 MG PO TABS
ORAL_TABLET | ORAL | Status: AC
  Filled 2022-05-17: qty 1

## 2022-05-17 MED ORDER — ROSUVASTATIN CALCIUM 20 MG PO TABS
ORAL_TABLET | ORAL | Status: AC
  Filled 2022-05-17: qty 2

## 2022-05-17 MED ORDER — ENOXAPARIN SODIUM 30 MG/0.3ML IJ SOSY
PREFILLED_SYRINGE | INTRAMUSCULAR | Status: AC
  Filled 2022-05-17: qty 0.3

## 2022-05-17 MED ORDER — METHOCARBAMOL 500 MG PO TABS: 500.0000 mg | ORAL_TABLET | Freq: Four times a day (QID) | ORAL | 0 refills | Status: DC | PRN

## 2022-05-17 MED ORDER — SENNA 8.6 MG PO TABS: 1.0000 | ORAL_TABLET | Freq: Every day | ORAL | 0 refills | Status: DC | PRN

## 2022-05-17 NOTE — Progress Notes (Signed)
    Attending Progress Note  History: Monica Ray is here for cervical myelopathy.  POD 1: She is doing well this morning.  She has not walked outside the room.  Her pain is under control.  Physical Exam: Vitals:   05/16/22 2319 05/17/22 0410  BP: (!) 103/52 (!) 142/54  Pulse: 87 84  Resp: 16 18  Temp: (!) 97.1 F (36.2 C) (!) 96.9 F (36.1 C)  SpO2: 93% 99%    AA Ox3 CNI  Strength:5/5 throughout BUE  Data:  Other tests/results: Drain-40  Assessment/Plan:  Monica Ray is doing well after anterior cervical discectomy and fusion for cervical myelopathy.  - mobilize - pain control - DVT prophylaxis - PTOT - Monitor drain output.  Will likely remove her drain later today.  Venetia Night MD, [facility] Department of Neurosurgery

## 2022-05-17 NOTE — Progress Notes (Signed)
Patient states she is going home and that she does not want her blood sugar checked, she wants IVs out and does not want to order a supper

## 2022-05-17 NOTE — Evaluation (Signed)
Occupational Therapy Evaluation Patient Details Name: Monica Ray MRN: [id number] DOB: August 22, 1945 Today's Date: 05/17/2022   History of Present Illness Morrisa Schaefer is a 77 y/o F with PMH including DM2, AAA, HTN, DDD, HF, depression, diabetic retinopathy; hx of back surgery. Here with cervical myelopathy now s/p C4-6 ACDF. Pt also has lumbar stenosis, per her report, but surgeon felt cervical intervention was more appropriate first.   Clinical Impression   Patient received for OT evaluation. See flowsheet below for details of function. Generally, patient requiring MIN A for bed mobility, supervision with RW for functional mobility, and MIN A for ADLs. Pt appears at/near functional baseline; supportive family at home. Patient with no further need for OT in acute care; discharge OT services.       Recommendations for follow up therapy are one component of a multi-disciplinary discharge planning process, led by the attending physician.  Recommendations may be updated based on patient status, additional functional criteria and insurance authorization.   Assistance Recommended at Discharge Intermittent Supervision/Assistance  Patient can return home with the following A little help with bathing/dressing/bathroom;Assistance with cooking/housework;Assist for transportation    Functional Status Assessment  Patient has had a recent decline in their functional status and demonstrates the ability to make significant improvements in function in a reasonable and predictable amount of time.  Equipment Recommendations  None recommended by OT    Recommendations for Other Services       Precautions / Restrictions Precautions Precautions: Fall;Cervical Required Braces or Orthoses:  (none) Restrictions Weight Bearing Restrictions: No      Mobility Bed Mobility Overal bed mobility: Needs Assistance Bed Mobility: Supine to Sit, Sit to Supine     Supine to sit: Supervision Sit to supine: Min  assist, Mod assist   General bed mobility comments: Unable to perform log roll very well; needing assist with BIL LE back into bed    Transfers Overall transfer level: Independent Equipment used: Rolling walker (2 wheels)               General transfer comment: Able to t/f to standing; took a moment to gather balance      Balance                                           ADL either performed or assessed with clinical judgement   ADL Overall ADL's : At baseline                                       General ADL Comments: Pt needing assistance with socks and MIN A with pants today for dressing; OT education provided on ADLs in context of neck precautions. Pt able to walk approx 20 feet from room to bathroom and t/f to high toilet without assistance, washed hands, and returned to bed. Required MIN A for bed mobility back into bed; poor log roll technique adherence. Pt very fatigued after returning to bed and stating her baseline low back pain is present.     Vision         Perception     Praxis      Pertinent Vitals/Pain Pain Assessment Pain Assessment: No/denies pain (although at end of session notes baseline chronic back pain)     Hand Dominance Right   Extremity/Trunk  Assessment Upper Extremity Assessment Upper Extremity Assessment: Overall WFL for tasks assessed (did not fully assess arm strength due to recent surgery; hands and arms were functional throughout session)   Lower Extremity Assessment Lower Extremity Assessment: Defer to PT evaluation   Cervical / Trunk Assessment Cervical / Trunk Assessment:  (slightly forward flexed posture of neck and upper back)   Communication Communication Communication: No difficulties   Cognition Arousal/Alertness: Awake/alert Behavior During Therapy: WFL for tasks assessed/performed Overall Cognitive Status: Within Functional Limits for tasks assessed                                  General Comments: Pleasant and motivated     General Comments  Pt used RW throughout session, as her rollator was not here with her. Defer to PT on whether pt should use rollator or RW. Pt able to walk with supervision.    Exercises     Shoulder Instructions      Home Living Family/patient expects to be discharged to:: Private residence Living Arrangements: Spouse/significant other Available Help at Discharge: Family;Available 24 hours/day (husband (is retired), daughter-in-law) Type of Home: House Home Access: Level entry     Home Layout: One level     Bathroom Shower/Tub: Producer, television/film/video: Handicapped height Bathroom Accessibility: Yes How Accessible: Accessible via walker Home Equipment: Rollator (4 wheels);Grab bars - tub/shower;Shower seat;BSC/3in1;Other (comment) (bed rail)   Additional Comments: Pt states her husband also uses a rollator at baseline      Prior Functioning/Environment Prior Level of Function : Needs assist;Working/employed;Driving;History of Falls (last six months)       Physical Assist : Mobility (physical);ADLs (physical)   ADLs (physical): Dressing Mobility Comments: Pt states she uses a rollator inside her home only; in community she does not use AD. Reports 1 fall about two months ago at church where she was walking and needed to sit (back pain) and was not able to get to the chair in time. ADLs Comments: Pt is (I) ADL and IADL, drives, works full time at the [employer] in [place]. Her husband assists her in donning her socks and sometimes pants at baseline. She has multiple reachers. Has CPAP at home. Notes that her BIL hands begin to tingle later in the day; she is able to type at work without issue. States her hands and neck often feel "tired" at the end of the day.        OT Problem List: Decreased activity tolerance;Impaired balance (sitting and/or standing)      OT Treatment/Interventions:      OT  Goals(Current goals can be found in the care plan section) Acute Rehab OT Goals Patient Stated Goal: Get better; go home OT Goal Formulation: All assessment and education complete, DC therapy  OT Frequency:      Co-evaluation              AM-PAC OT "6 Clicks" Daily Activity     Outcome Measure Help from another person eating meals?: None Help from another person taking care of personal grooming?: None Help from another person toileting, which includes using toliet, bedpan, or urinal?: None Help from another person bathing (including washing, rinsing, drying)?: A Little Help from another person to put on and taking off regular upper body clothing?: None Help from another person to put on and taking off regular lower body clothing?: A Little 6 Click Score: 22  End of Session Equipment Utilized During Treatment: Rolling walker (2 wheels) Nurse Communication: Mobility status  Activity Tolerance: Patient tolerated treatment well;Other (comment) (limited by baseline chronic back pain) Patient left: in bed;with call bell/phone within reach;with family/visitor present  OT Visit Diagnosis: Other symptoms and signs involving the nervous system (W09.811(R29.898)                Time: [id number] OT Time Calculation (min): 27 min Charges:  OT General Charges $OT Visit: 1 Visit OT Evaluation $OT Eval Moderate Complexity: 1 Mod OT Treatments $Self Care/Home Management : 8-22 mins  Linward FosterLoy Anne Marybelle Giraldo, MS, OTR/L  Alvester MorinLoy  Melrose Kearse 05/17/2022, 9:40 AM

## 2022-05-17 NOTE — Discharge Summary (Signed)
Physician Discharge Summary  Patient ID: BREAN CALBERT MRN: [id number] DOB/AGE: [birth year] 77 y.o.  Admit date: 05/16/2022 Discharge date: 05/17/2022  Admission Diagnoses:G95.9 Cervical myelopathy, M48.02 Cervical stenosis   Discharge Diagnoses:  Principal Problem:   Cervical myelopathy Active Problems:   Cervical spinal stenosis   Discharged Condition: good  Hospital Course: Ms. Bellevue did well with surgical intervention.  She was monitored overnight and worked with PTOT.  She was stable for discharge on POD1.  Consults: None  Significant Diagnostic Studies: radiology: X-Ray: correct localization  Treatments: surgery: C4-6 ACDF  Discharge Exam: Blood pressure (!) 124/42, pulse 67, temperature (!) 97 F (36.1 C), temperature source Temporal, resp. rate 16, height 5\' 4"  (1.626 m), weight 108.7 kg, SpO2 97 %. General appearance: alert and cooperative CNI Doing well 5/5 throughout   Disposition: Discharge disposition: 01-Home or Self Care       Discharge Instructions     Discharge patient   Complete by: As directed    Discharge disposition: 01-Home or Self Care   Discharge patient date: 05/17/2022   Incentive spirometry RT   Complete by: As directed       Allergies as of 05/17/2022       Reactions   Tegaderm Ag Mesh [silver] Rash   Amoxicillin Hives, Rash   Did it involve swelling of the face/tongue/throat, SOB, or low BP? No Did it involve sudden or severe rash/hives, skin peeling, or any reaction on the inside of your mouth or nose? No Did you need to seek medical attention at a hospital or doctor's office? No When did it last happen?  ~2015  If all above answers are "NO", may proceed with cephalosporin use.   Metformin Diarrhea, Nausea And Vomiting   Oxycodone Itching   Tape Other (See Comments)   Caused burning sensation, Tagaderm    Azithromycin Rash, Itching   Clindamycin Hives, Rash   Fluconazole Itching   Simvastatin Other (See Comments)   Back  pain        Medication List     STOP taking these medications    celecoxib 100 MG capsule Commonly known as: CeleBREX       TAKE these medications    alendronate 70 MG tablet Commonly known as: FOSAMAX Take 70 mg by mouth once a week. Take with a full glass of water on an empty stomach. Takes on weekends   ascorbic acid 500 MG tablet Commonly known as: VITAMIN C Take 250 mg by mouth daily.   CALCIUM PO Take 1 tablet by mouth daily in the afternoon. Gummy   clotrimazole-betamethasone cream Commonly known as: LOTRISONE Apply 1 application topically 2 (two) times daily as needed (rash).   diltiazem 120 MG 24 hr capsule Commonly known as: CARDIZEM CD Take 1 capsule (120 mg total) by mouth daily. What changed: when to take this   DULoxetine 60 MG capsule Commonly known as: CYMBALTA Take 60 mg by mouth every morning.   furosemide 40 MG tablet Commonly known as: LASIX Take 20-40 mg by mouth as needed.   HYDROcodone-acetaminophen 5-325 MG tablet Commonly known as: NORCO/VICODIN Take 1 tablet by mouth every 4 (four) hours as needed for moderate pain ((score 4 to 6)).   insulin lispro 100 UNIT/ML injection Commonly known as: HUMALOG Inject 8-24 Units into the skin 3 (three) times daily with meals. 24 units before breakfast and lunch and 28 units before supper - plus sliding scale (4 units per 50 over a target of 150) USES SS  irbesartan 300 MG tablet Commonly known as: AVAPRO Take 1 tablet (300 mg total) by mouth daily. What changed: when to take this   methocarbamol 500 MG tablet Commonly known as: ROBAXIN Take 1 tablet (500 mg total) by mouth every 6 (six) hours as needed for muscle spasms.   nitroGLYCERIN 0.4 MG SL tablet Commonly known as: Nitrostat Place 1 tablet (0.4 mg total) under the tongue every 5 (five) minutes as needed for chest pain.   Ozempic (2 MG/DOSE) 8 MG/3ML Sopn Generic drug: Semaglutide (2 MG/DOSE) Inject 2 mg as directed once a week.  Mondays   pioglitazone 45 MG tablet Commonly known as: ACTOS Take 45 mg by mouth every morning.   rosuvastatin 40 MG tablet Commonly known as: CRESTOR TAKE 1 TABLET(40 MG) BY MOUTH DAILY What changed: See the new instructions.   senna 8.6 MG Tabs tablet Commonly known as: SENOKOT Take 1 tablet (8.6 mg total) by mouth daily as needed for mild constipation.   spironolactone 25 MG tablet Commonly known as: Aldactone Take 1 tablet (25 mg total) by mouth daily. What changed: when to take this   Toujeo Max SoloStar 300 UNIT/ML Solostar Pen Generic drug: insulin glargine (2 Unit Dial) Inject 50 Units into the skin at bedtime.   vitamin B-12 500 MCG tablet Commonly known as: CYANOCOBALAMIN Take 500 mcg by mouth daily.   Vitamin D3 125 MCG (5000 UT) Tabs Generic drug: Cholecalciferol Take 5,000 Units by mouth daily.         SignedVenetia Night 05/17/2022, 5:57 PM

## 2022-05-17 NOTE — Evaluation (Signed)
Physical Therapy Evaluation Patient Details Name: ALAIRA BOSTROM MRN: [id number] DOB: 11-04-1945 Today's Date: 05/17/2022  History of Present Illness  Pt is a 77 yo female s/p C4-C6 anterior discectomy and fusion. PMH of sleep apnea, HTN, CAD, DM, HF, CKD.  Clinical Impression  Patient alert, agreeable to PT, reported low back pain (chronic) but no acute cervical pain. Per pt she will have assistance at home, modI-I for ambulation/ADLs, uses rollator but not in community.  She was able to perform bed mobility modI, reliance on bed rails. Sit <> stand with RW and ambulated ~50ft modI. No LOB or unsteadiness noted. Pt agreeable to utilize rollator at discharge to maintain safety with mobility. Overall the patient would benefit from further skilled PT services to maximize function and return to PLOF.        Recommendations for follow up therapy are one component of a multi-disciplinary discharge planning process, led by the attending physician.  Recommendations may be updated based on patient status, additional functional criteria and insurance authorization.  Follow Up Recommendations       Assistance Recommended at Discharge Intermittent Supervision/Assistance  Patient can return home with the following  Assistance with cooking/housework;A little help with bathing/dressing/bathroom;Help with stairs or ramp for entrance    Equipment Recommendations None recommended by PT  Recommendations for Other Services       Functional Status Assessment Patient has had a recent decline in their functional status and demonstrates the ability to make significant improvements in function in a reasonable and predictable amount of time.     Precautions / Restrictions Precautions Precautions: Fall;Cervical Required Braces or Orthoses:  (none) Restrictions Weight Bearing Restrictions: No      Mobility  Bed Mobility Overal bed mobility: Modified Independent Bed Mobility: Rolling, Sidelying to  Sit Rolling: Modified independent (Device/Increase time) Sidelying to sit: Modified independent (Device/Increase time)            Transfers Overall transfer level: Modified independent Equipment used: Rolling walker (2 wheels), Pushed w/c                    Ambulation/Gait Ambulation/Gait assistance: Modified independent (Device/Increase time) Gait Distance (Feet): 80 Feet Assistive device: Rolling walker (2 wheels) Gait Pattern/deviations: WFL(Within Functional Limits)          Stairs            Wheelchair Mobility    Modified Rankin (Stroke Patients Only)       Balance Overall balance assessment: Needs assistance Sitting-balance support: Feet supported Sitting balance-Leahy Scale: Good       Standing balance-Leahy Scale: Good                               Pertinent Vitals/Pain Pain Assessment Pain Assessment: Faces Faces Pain Scale: Hurts little more Pain Location: chronic low back pain    Home Living Family/patient expects to be discharged to:: Private residence Living Arrangements: Spouse/significant other Available Help at Discharge: Family;Available 24 hours/day (husband (is retired), daughter-in-law) Type of Home: House Home Access: Level entry       Home Layout: One level Home Equipment: Rollator (4 wheels);Grab bars - tub/shower;Shower seat;BSC/3in1;Other (comment) (bed rail) Additional Comments: Pt states her husband also uses a rollator at baseline    Prior Function Prior Level of Function : Needs assist;Working/employed;Driving;History of Falls (last six months)       Physical Assist : Mobility (physical);ADLs (physical)   ADLs (physical): Dressing  Mobility Comments: Pt states she uses a rollator inside her home only; in community she does not use AD. Reports 1 fall about two months ago at church where she was walking and needed to sit (back pain) and was not able to get to the chair in time. ADLs Comments: Pt  is (I) ADL and IADL, drives, works full time at the [employer] in [place]. Her husband assists her in donning her socks and sometimes pants at baseline. She has multiple reachers. Has CPAP at home. Notes that her BIL hands begin to tingle later in the day; she is able to type at work without issue. States her hands and neck often feel "tired" at the end of the day.     Hand Dominance   Dominant Hand: Right    Extremity/Trunk Assessment   Upper Extremity Assessment Upper Extremity Assessment: Defer to OT evaluation    Lower Extremity Assessment Lower Extremity Assessment: Overall WFL for tasks assessed    Cervical / Trunk Assessment Cervical / Trunk Assessment: Neck Surgery  Communication   Communication: No difficulties  Cognition Arousal/Alertness: Awake/alert Behavior During Therapy: WFL for tasks assessed/performed Overall Cognitive Status: Within Functional Limits for tasks assessed                                          General Comments General comments (skin integrity, edema, etc.): Pt used RW throughout session, as her rollator was not here with her. Defer to PT on whether pt should use rollator or RW. Pt able to walk with supervision.    Exercises     Assessment/Plan    PT Assessment Patient needs continued PT services  PT Problem List Decreased mobility;Decreased balance;Decreased activity tolerance;Pain       PT Treatment Interventions DME instruction;Therapeutic activities;Gait training;Therapeutic exercise;Patient/family education;Stair training;Balance training;Functional mobility training;Neuromuscular re-education    PT Goals (Current goals can be found in the Care Plan section)  Acute Rehab PT Goals Patient Stated Goal: to go home PT Goal Formulation: With patient Time For Goal Achievement: 05/31/22 Potential to Achieve Goals: Good    Frequency 7X/week     Co-evaluation               AM-PAC PT "6 Clicks" Mobility  Outcome  Measure Help needed turning from your back to your side while in a flat bed without using bedrails?: A Little Help needed moving from lying on your back to sitting on the side of a flat bed without using bedrails?: A Little Help needed moving to and from a bed to a chair (including a wheelchair)?: A Little Help needed standing up from a chair using your arms (e.g., wheelchair or bedside chair)?: A Little Help needed to walk in hospital room?: A Little Help needed climbing 3-5 steps with a railing? : A Little 6 Click Score: 18    End of Session   Activity Tolerance: Patient tolerated treatment well Patient left: in chair;with call bell/phone within reach Nurse Communication: Mobility status PT Visit Diagnosis: Other abnormalities of gait and mobility (R26.89);Muscle weakness (generalized) (M62.81);Pain Pain - Right/Left:  (midline) Pain - part of body:  (cervical)    Time: 1504-1364 PT Time Calculation (min) (ACUTE ONLY): 32 min   Charges:   PT Evaluation $PT Eval Low Complexity: 1 Low PT Treatments $Therapeutic Activity: 8-22 mins        Olga Coaster PT, DPT 12:21 PM,05/17/22

## 2022-05-17 NOTE — Discharge Instructions (Signed)
Your surgeon has performed an operation on your cervical spine (neck) to relieve pressure on the spinal cord and/or nerves. This involved making an incision in the front of your neck and removing one or more of the discs that support your spine. Next, a small piece of bone, a titanium plate, and screws were used to fuse two or more of the vertebrae (bones) together.  The following are instructions to help in your recovery once you have been discharged from the hospital. Even if you feel well, it is important that you follow these activity guidelines. If you do not let your neck heal properly from the surgery, you can increase the chance of return of your symptoms and other complications.  * Do not take anti-inflammatory medications for 3 months after surgery (naproxen [Aleve], ibuprofen [Advil, Motrin], etc.). These medications can prevent your bones from healing properly.  Celebrex, if prescribed, is ok to take.  Activity    No bending, lifting, or twisting ("BLT"). Avoid lifting objects heavier than 10 pounds (gallon milk jug).  Where possible, avoid household activities that involve lifting, bending, reaching, pushing, or pulling such as laundry, vacuuming, grocery shopping, and childcare. Try to arrange for help from friends and family for these activities while your back heals.  Increase physical activity slowly as tolerated.  Taking short walks is encouraged, but avoid strenuous exercise. Do not jog, run, bicycle, lift weights, or participate in any other exercises unless specifically allowed by your doctor.  Talk to your doctor before resuming sexual activity.  You should not drive until cleared by your doctor.  Until released by your doctor, you should not return to work or school.  You should rest at home and let your body heal.   You may shower three days after your surgery.  After showering, lightly dab your incision dry. Do not take a tub bath or go swimming until approved by your  doctor at your follow-up appointment.  If your doctor ordered a cervical collar (neck brace) for you, you should wear it whenever you are out of bed. You may remove it when lying down or sleeping, but you should wear it at all other times. Not all neck surgeries require a cervical collar.  If you smoke, we strongly recommend that you quit.  Smoking has been proven to interfere with normal bone healing and will dramatically reduce the success rate of your surgery. Please contact [facility] (800-QUIT-NOW) and use the resources at www.[facility].com for assistance in stopping smoking.  Surgical Incision   If you have a dressing on your incision, you may remove it two days after your surgery. Keep your incision area clean and dry.  If you have staples or stitches on your incision, you should have a follow up scheduled for removal. If you do not have staples or stitches, you will have steri-strips (small pieces of surgical tape) or Dermabond glue. The steri-strips/glue should begin to peel away within about a week (it is fine if the steri-strips fall off before then). If the strips are still in place one week after your surgery, you may gently remove them.  Diet           You may return to your usual diet. However, you may experience discomfort when swallowing in the first month after your surgery. This is normal. You may find that softer foods are more comfortable for you to swallow. Be sure to stay hydrated.  When to Contact Us  You may experience pain in your   neck and/or pain between your shoulder blades. This is normal and should improve in the next few weeks with the help of pain medication, muscle relaxers, and rest. Some patients report that a warm compress on the back of the neck or between the shoulder blades helps.  However, should you experience any of the following, contact us immediately: New numbness or weakness Pain that is progressively getting worse, and is not relieved by your pain  medication, muscle relaxers, rest, and warm compresses Bleeding, redness, swelling, pain, or drainage from surgical incision Chills or flu-like symptoms Fever greater than 101.0 F (38.3 C) Inability to eat, drink fluids, or take medications Problems with bowel or bladder functions Difficulty breathing or shortness of breath Warmth, tenderness, or swelling in your calf Contact Information During office hours (Monday-Friday 9 am to 5 pm), please call your physician at [phone] and ask for Kendelyn Jean After hours and weekends, please call [phone] and speak with the neurosurgeon on call For a life-threatening emergency, call 911  

## 2022-05-17 NOTE — Inpatient Diabetes Management (Signed)
Inpatient Diabetes Program Recommendations  AACE/ADA: New Consensus Statement on Inpatient Glycemic Control   Target Ranges:  Prepandial:   less than 140 mg/dL      Peak postprandial:   less than 180 mg/dL (1-2 hours)      Critically ill patients:  140 - 180 mg/dL    Latest Reference Range & Units 05/16/22 10:38 05/16/22 14:35 05/16/22 21:27 05/17/22 07:39 05/17/22 12:07  Glucose-Capillary 70 - 99 mg/dL 027 (H) 741 (H) 287 (H) 262 (H) 216 (H)   Review of Glycemic Control  Diabetes history: DM2 Outpatient Diabetes medications: Humalog 24 units with breakfast and lunch, Humalog 28 units with supper, Humalog per sliding scale, Toujeo 50 units QHS, Ozempic 2 mg Qweek (Monday), Actos 45 mg QAM Current orders for Inpatient glycemic control: Semglee 40 units QHS, Novolog 0-290 units TID with meals, Actos 45 mg daily  Inpatient Diabetes Program Recommendations:    Insulin: Please consider increasing Semglee to 45 units QHS and adding Novolog 4 units TID with meals for meal coverage if patient eats at least 50% of meals.  NOTE: Patient had cervical surgery on 05/16/22 and received Decadron 5 mg at 12:26 on 05/16/22 which is contributing to hyperglycemia.   Thanks, Orlando Penner, RN, MSN, CDC[facility] Diabetes Coordinator Inpatient Diabetes Program [phone] (Team Pager from 8am to 5pm)

## 2022-05-17 NOTE — Plan of Care (Signed)
  Problem: Activity: Goal: Ability to avoid complications of mobility impairment will improve Outcome: Progressing   Problem: Pain Management: Goal: Pain level will decrease Outcome: Progressing   Problem: Bladder/Genitourinary: Goal: Urinary functional status for postoperative course will improve Outcome: Progressing

## 2022-05-17 NOTE — Progress Notes (Signed)
DISCHARGE NOTE:  Pt given discharge instructions and verbalized understanding. Pt wheeled to car by staff, family providing transportation.  

## 2022-05-22 ENCOUNTER — Telehealth: Payer: Self-pay | Admitting: Cardiovascular Disease

## 2022-05-22 ENCOUNTER — Encounter: Payer: Self-pay | Admitting: Neurosurgery

## 2022-05-22 NOTE — Telephone Encounter (Signed)
Pt is returning call.  

## 2022-05-22 NOTE — Telephone Encounter (Signed)
Attempted to contact the patient. No answer- I left a message to please call back.  

## 2022-05-22 NOTE — Telephone Encounter (Signed)
  Pt said, after her procedure, pt been feeling dizzy and low BP. Pt been taking her irbesartan in the evening now and she is wondering since she also taking diltiazem and spironolactone it might be too much medication to take all at once. She wants to know if Dr. Kirke Corin would like to see her sooner than 05/14 and she need to change some of her medication

## 2022-05-23 NOTE — Telephone Encounter (Signed)
Patient is calling back for update. Please advise  

## 2022-05-23 NOTE — Telephone Encounter (Signed)
Decrease irbesartan to 150 mg once daily.

## 2022-05-23 NOTE — Telephone Encounter (Signed)
Returned the call to the patient. She stated that before she had her neck surgery that she had fallen due to being dizzy. Then at her pre-op procedure her bottom number was in the 40's.    She currently takes: Diltiazem 120 mg Irbesartan 300 mg-takes at night Spironolactone 25 mg   She stated that she does take her blood pressure daily but does not remember what the readings were and does not write them down. She stated that they have been typically in the 110's/40-60's. She has been symptomatic since the decline in her pressures.  She has been advised to check her blood pressures 1-2 hours after taking her medications and to keep a log of them.

## 2022-05-24 ENCOUNTER — Other Ambulatory Visit: Payer: Self-pay | Admitting: Cardiovascular Disease

## 2022-05-24 MED ORDER — IRBESARTAN 150 MG PO TABS: 150.0000 mg | ORAL_TABLET | Freq: Every day | ORAL | 0 refills | Status: DC

## 2022-05-24 NOTE — Telephone Encounter (Signed)
Patient has been called and made aware. She will keep a log of her pressures and keep [place] updated as needed.

## 2022-05-29 NOTE — Progress Notes (Deleted)
   REFERRING PHYSICIAN:  Patrice Paradise, Md [address] [facility] [place],  [place] [address]  DOS: 05/16/22  ACDF C4-C6 for cervical myelopathy  HISTORY OF PRESENT ILLNESS: Monica Ray is 2 weeks status post ACDF C4-C6 for cervical myelopathy. Given norco 5 and robaxin on discharge from the hospital.       PHYSICAL EXAMINATION:  NEUROLOGICAL:  General: In no acute distress.   Awake, alert, oriented to person, place, and time.  Pupils equal round and reactive to light.  Facial tone is symmetric.    Strength: Side Biceps Triceps Deltoid Interossei Grip Wrist Ext. Wrist Flex.  R L Incision c/d/i  Imaging:  Nothing new to review.   Assessment / Plan: Monica Ray is doing well s/p above surgery. Treatment options reviewed with patient and following plan made:   - We discussed activity escalation and I have advised the patient to lift up to 10 pounds until 6 weeks after surgery (until follow up with Dr. Myer Haff).   - Reviewed wound care.  - Continue current medications including *** - Follow up as scheduled in 4 weeks and prn.   Advised to contact the office if any questions or concerns arise.   Drake Leach PA-C Dept of Neurosurgery

## 2022-05-31 ENCOUNTER — Encounter: Payer: BLUE CROSS/BLUE SHIELD | Admitting: Orthopedic Surgery

## 2022-05-31 ENCOUNTER — Ambulatory Visit (INDEPENDENT_AMBULATORY_CARE_PROVIDER_SITE_OTHER): Payer: Medicare Other | Admitting: Neurosurgery

## 2022-05-31 ENCOUNTER — Encounter: Payer: Self-pay | Admitting: Neurosurgery

## 2022-05-31 VITALS — BP 128/70 | Temp 97.7°F

## 2022-05-31 DIAGNOSIS — Z981 Arthrodesis status: Secondary | ICD-10-CM

## 2022-05-31 DIAGNOSIS — G959 Disease of spinal cord, unspecified: Secondary | ICD-10-CM

## 2022-05-31 DIAGNOSIS — Z09 Encounter for follow-up examination after completed treatment for conditions other than malignant neoplasm: Secondary | ICD-10-CM

## 2022-05-31 DIAGNOSIS — M4802 Spinal stenosis, cervical region: Secondary | ICD-10-CM

## 2022-05-31 NOTE — Progress Notes (Signed)
   DOS: 05/16/2022 (C4-6 ACDF)  HISTORY OF PRESENT ILLNESS: 05/31/2022 [name] is status post anterior cervical disc fusion.  Her strength has returned.  She continues to have back issues.  Her swallowing has returned to normal.  She still having some voice related changes.Marland Kitchen   PHYSICAL EXAMINATION:   Vitals:   05/31/22 1145  BP: 128/70  Temp: 97.7 F (36.5 C)   General: Patient is well developed, well nourished, calm, collected, and in no apparent distress.  NEUROLOGICAL:  General: In no acute distress.  Awake, alert, oriented to person, place, and time. Pupils equal round and reactive to light.   Strength: Side Biceps Triceps Deltoid Interossei Grip Wrist Ext. Wrist Flex.  R L Incision c/d/i   ROS (Neurologic): Negative except as noted above  IMAGING: No interval imaging to review   ASSESSMENT/PLAN:  Monica Ray is doing well after anterior cervical discectomy and fusion.  I think her voice will continue to improve.  She has lost 8 pounds since surgery.  We have set a goal weight of 210 pounds to consider intervention on her back.  I will see her back in 4 weeks.  We reviewed her activity limitations.   I spent a total of 10 minutes in this patient's care today. This time was spent reviewing pertinent records including imaging studies, obtaining and confirming history, performing a directed evaluation, formulating and discussing my recommendations, and documenting the visit within the medical record.    Venetia Night MD, [facility] Department of Neurosurgery

## 2022-06-14 ENCOUNTER — Telehealth: Payer: Self-pay | Admitting: Neurosurgery

## 2022-06-14 NOTE — Telephone Encounter (Signed)
Monica Ray from [facility] Reporting a fall from this morning 05/09, stepped off scale at kidney appt and lost her balance and fell backwards. Did hit her head reports "blacking out", EMT was called but she refused to be taken to the ED. Bruise on ankle, stiff in her neck, no dizziness.

## 2022-06-14 NOTE — Telephone Encounter (Signed)
Discussed with Danielle. If she develops any new/worsening neck pain, we can bring her in sooner for an xray. If she develops any new symptoms such as headaches, blurry vision, she should be evaluated at the ER.

## 2022-06-14 NOTE — Telephone Encounter (Signed)
I spoke with the patient and notified her of the message below. She verbalized understanding.  

## 2022-06-19 ENCOUNTER — Encounter: Payer: Self-pay | Admitting: Cardiovascular Disease

## 2022-06-19 ENCOUNTER — Ambulatory Visit: Payer: Medicare Other | Attending: Cardiovascular Disease | Admitting: Cardiovascular Disease

## 2022-06-19 VITALS — BP 127/68 | HR 75 | Ht 64.0 in | Wt 230.2 lb

## 2022-06-19 DIAGNOSIS — I10 Essential (primary) hypertension: Secondary | ICD-10-CM | POA: Diagnosis present

## 2022-06-19 DIAGNOSIS — I2510 Atherosclerotic heart disease of native coronary artery without angina pectoris: Secondary | ICD-10-CM | POA: Diagnosis present

## 2022-06-19 DIAGNOSIS — I739 Peripheral vascular disease, unspecified: Secondary | ICD-10-CM | POA: Insufficient documentation

## 2022-06-19 DIAGNOSIS — I5032 Chronic diastolic (congestive) heart failure: Secondary | ICD-10-CM | POA: Diagnosis present

## 2022-06-19 DIAGNOSIS — E785 Hyperlipidemia, unspecified: Secondary | ICD-10-CM | POA: Diagnosis present

## 2022-06-19 MED ORDER — EMPAGLIFLOZIN 10 MG PO TABS: 10.0000 mg | ORAL_TABLET | Freq: Every day | ORAL | 3 refills | Status: DC

## 2022-06-19 NOTE — Progress Notes (Signed)
Cardiology Office Note   Date:  06/19/2022   ID:  Monica, Ray 12-Apr-1945, MRN [id number]  PCP:  Patrice Paradise, MD  Cardiologist:   Lorine Bears, MD   Chief Complaint  Patient presents with   Follow-up    6 Month f/u c/o low bp, dizziness and syncope. Pt stopped Irbesartan x2 days. Meds reviewed verbally with pt.       History of Present Illness: Monica Ray is a 77 y.o. female who presents for a follow-up visit regarding chronic diastolic heart failure and nonobstructive coronary artery disease. She has multiple chronic medical conditions that include prolonged history of type 2 diabetes , essential hypertension, sleep apnea with poor intolerance to CPAP, CKD, hyperlipidemia and obesity. She is a lifelong nonsmoker.  She had previous back surgery. Previous  right and left cardiac catheterization in November 2017 showed normal LV systolic function with mildly elevated left ventricular end-diastolic pressure and mild pulmonary hypertension. There was mild nonobstructive coronary artery disease.   Pulmonary pressure was 37/17 with a mean of 25 mmHg. LVEDP was 18 mmHg.  She had recurrent chest pain in July 2020 that was evaluated by CTA of the coronary arteries.which showed moderate LAD disease which was significant by FFR.  Coronary calcium score was 120.  Due to that, a left heart catheterization was done in August 2020 which showed 40% proximal LAD disease and 50% ostial first diagonal disease.  Ejection fraction was normal with moderately elevated left ventricular end-diastolic pressure in the setting of moderately elevated blood pressure.  She had recurrent heart failure symptoms in 2023. She underwent cardiac catheterization in September, 2023 which showed stable moderate one-vessel coronary artery disease involving the LAD.  Right heart catheterization showed moderately elevated right and left-sided filling pressures, mild pulmonary hypertension and high cardiac  output.  She was placed on spironolactone. Due to low back pain, arterial Doppler studies were done which showed normal ABI bilaterally with some abnormal waveforms in the common femoral artery.  Duplex showed borderline disease in the iliac arteries bilaterally.  She had cervical spine surgery in April by Dr. Marcell Barlow.  She reports no significant improvement in her symptoms since the surgery.  She has been dealing with increased dizziness and low blood pressure.  She has been holding her irbesartan over the last 3 days.  In spite of that, she continues to complain of dizziness.  She reports having a syncopal episode last week when she was at her nephrologist office.  She was fasting for labs and had her labs done.  She then stood on the scale and had loss of consciousness.  She is noted to be orthostatic today.  She continues to complain of shortness of breath but no chest pain.  Past Medical History:  Diagnosis Date   Anemia    before hysterectomy   Anginal pain (HCC)    Aortic atherosclerosis (HCC)    Cervical myelopathy (HCC)    Cervical spinal stenosis    Chronic heart failure with preserved ejection fraction (HFpEF) (HCC)    a. 09/2021 Echo: EF 60-65%, no rwma, GrI DD, nl RV fxn, RVSP 47.46mmHg. Mildly dil LA; b. 10/2021 RHC: RA 15, RV 50/9, PA 48/21 (32), PCWP 21, LVEDP 27.   CKD (chronic kidney disease), stage III (HCC)    DDD (degenerative disc disease), lumbar    Depression    Diabetes mellitus, type 2 (HCC)    DM retinopathy (HCC)    Dyspnea on exertion  Edema    GERD (gastroesophageal reflux disease)    History of bilateral cataract extraction 2021   Hx of phlebitis    Hyperlipidemia    Hypertension    Lumbar stenosis    Mild allergic rhinitis    MRSA (methicillin resistant staph aureus) culture positive 03/2006   Nonobstructive CAD (coronary artery disease)    a. 12/2015 Cath: LAD min irregs, otw nl cors; b. 08/2018 Cor CTA: Mid/Distal LAD and OM1 dzs w/ abnl ctFFR; c.  09/2018 Cath: LM nl, LAD 40p, 63m, D1 50, LCX small-nl, RCA nl, EF 55-65%; c. 10/2021 Cath: LM nl, LAD 40p/66m, lat D1 60, LCX sm nl, OM1 nl, RCA large, nl, RPDA/PAV/PL1-3 nl.   OA (osteoarthritis)    hands   Obesity    OSA on CPAP    Pulmonary HTN (HCC)    a.) R/LHC 12/08/2015: mPA 25, mPCWP 15, AO sat 93.5, CO 6.69, CI 3.04; b.) TTE 09/21/2021: RVSP 47.8; c.) R/LHC 10/23/2021: mPA 32, mPCWP 21, PA sat 73, CO 11.5    Past Surgical History:  Procedure Laterality Date   ABDOMINAL HYSTERECTOMY     ADENOIDECTOMY     ANTERIOR CERVICAL DECOMP/DISCECTOMY FUSION N/A 05/16/2022   Procedure: C4-6 ANTERIOR CERVICAL DISCECTOMY AND FUSION;  Surgeon: Venetia Night, MD;  Location: [facility];  Service: Neurosurgery;  Laterality: N/A;   APPENDECTOMY  1989   CARDIAC CATHETERIZATION Bilateral 12/08/2015   Procedure: Right/Left Heart Cath and Coronary Angiography;  Surgeon: Iran Ouch, MD;  Location: [facility];  Service: Cardiovascular;  Laterality: Bilateral;   CATARACT EXTRACTION W/PHACO Left 05/12/2019   Procedure: CATARACT EXTRACTION PHACO AND INTRAOCULAR LENS PLACEMENT (IOC) LEFT VISION BLUE;  Surgeon: Elliot Cousin, MD;  Location: [facility];  Service: Ophthalmology;  Laterality: Left;  12.80 1:17.1   CATARACT EXTRACTION W/PHACO Right 06/11/2019   Procedure: CATARACT EXTRACTION PHACO AND INTRAOCULAR LENS PLACEMENT (IOC) RIGHT DIABETIC VISION BLUE 12.91  01:21.2;  Surgeon: Elliot Cousin, MD;  Location: [facility];  Service: Ophthalmology;  Laterality: Right;  Diabetic - insulin and oral meds   CHOLECYSTECTOMY  1989   LEFT HEART CATH AND CORONARY ANGIOGRAPHY Left 09/15/2018   Procedure: LEFT HEART CATH AND CORONARY ANGIOGRAPHY;  Surgeon: Iran Ouch, MD;  Location: [facility];  Service: Cardiovascular;  Laterality: Left;   LUMBAR LAMINECTOMY/DECOMPRESSION MICRODISCECTOMY N/A 11/25/2014   Procedure: LUMBAR LAMINECTOMY/DECOMPRESSION MICRODISCECTOMY LUMBAR  TWO-THREE, LUMBAR THREE-FOUR ;  Surgeon: Tressie Stalker, MD;  Location: [facility];  Service: Neurosurgery;  Laterality: N/A;   RIGHT/LEFT HEART CATH AND CORONARY ANGIOGRAPHY Bilateral 10/23/2021   Procedure: RIGHT/LEFT HEART CATH AND CORONARY ANGIOGRAPHY;  Surgeon: Iran Ouch, MD;  Location: [facility];  Service: Cardiovascular;  Laterality: Bilateral;   ROTATOR CUFF REPAIR Right 08/2001     Current Outpatient Medications  Medication Sig Dispense Refill   ascorbic acid (VITAMIN C) 500 MG tablet Take 250 mg by mouth daily.     CALCIUM PO Take 1 tablet by mouth daily in the afternoon. Gummy     Cholecalciferol (VITAMIN D3) 125 MCG (5000 UT) TABS Take 5,000 Units by mouth daily.      clotrimazole-betamethasone (LOTRISONE) cream Apply 1 application topically 2 (two) times daily as needed (rash).      diltiazem (CARDIZEM CD) 120 MG 24 hr capsule Take 1 capsule (120 mg total) by mouth daily. (Patient taking differently: Take 120 mg by mouth every morning.) 90 capsule 2   DULoxetine (CYMBALTA) 60 MG capsule Take  60 mg by mouth every morning.     furosemide (LASIX) 40 MG tablet Take 20-40 mg by mouth as needed.     insulin lispro (HUMALOG) 100 UNIT/ML injection Inject 8-24 Units into the skin 3 (three) times daily with meals. 24 units before breakfast and lunch and 28 units before supper - plus sliding scale (4 units per 50 over a target of 150) USES SS     methocarbamol (ROBAXIN) 500 MG tablet Take 1 tablet (500 mg total) by mouth every 6 (six) hours as needed for muscle spasms. 60 tablet 0   nitroGLYCERIN (NITROSTAT) 0.4 MG SL tablet Place 1 tablet (0.4 mg total) under the tongue every 5 (five) minutes as needed for chest pain. 25 tablet 3   OZEMPIC, 2 MG/DOSE, 8 MG/3ML SOPN Inject 2 mg as directed once a week. Mondays     pioglitazone (ACTOS) 45 MG tablet Take 45 mg by mouth every morning.     rosuvastatin (CRESTOR) 40 MG tablet TAKE 1 TABLET(40 MG) BY MOUTH DAILY (Patient taking  differently: Take 40 mg by mouth every morning.) 90 tablet 0   spironolactone (ALDACTONE) 25 MG tablet Take 1 tablet (25 mg total) by mouth daily. (Patient taking differently: Take 25 mg by mouth every morning.) 30 tablet 3   TOUJEO MAX SOLOSTAR 300 UNIT/ML Solostar Pen Inject 50 Units into the skin at bedtime.     vitamin B-12 (CYANOCOBALAMIN) 500 MCG tablet Take 500 mcg by mouth daily.     irbesartan (AVAPRO) 150 MG tablet Take 1 tablet (150 mg total) by mouth daily. (Patient not taking: Reported on 06/19/2022) 30 tablet 0   No current facility-administered medications for this visit.    Allergies:   Tegaderm ag mesh [silver], Amoxicillin, Metformin, Doxycycline, Oxycodone, Tape, Azithromycin, Clindamycin, Fluconazole, and Simvastatin    Social History:  The patient  reports that she has never smoked. She has never used smokeless tobacco. She reports that she does not drink alcohol and does not use drugs.   Family History:  The patient's family history includes Alcohol abuse in her father and mother; Aneurysm in her father and paternal grandmother; Breast cancer in her paternal aunt; Cancer in her father and sister; Cancer (age of onset: 2) in her mother; Diabetes in her maternal grandmother and mother.    ROS:  Please see the history of present illness.   Otherwise, review of systems are positive for none.   All other systems are reviewed and negative.    PHYSICAL EXAM: VS:  BP 127/68 (BP Location: Left Arm, Patient Position: Sitting, Cuff Size: Large)   Pulse 75   Ht 5\' 4"  (1.626 m)   Wt 230 lb 4 oz (104.4 kg)   SpO2 96%   BMI 39.52 kg/m  , BMI Body mass index is 39.52 kg/m. GEN: Well nourished, well developed, in no acute distress  HEENT: normal  Neck: no JVD, carotid bruits, or masses Cardiac: RRR; no murmurs, rubs, or gallops,no edema  Respiratory:  clear to auscultation bilaterally, normal work of breathing GI: soft, nontender, nondistended, + BS MS: no deformity or atrophy   Skin: warm and dry, no rash Neuro:  Strength and sensation are intact Psych: euthymic mood, full affect Distal pulses are palpable.   EKG:  EKG is  ordered today. EKG showed normal sinus rhythm with no significant ST or T wave changes.   Recent Labs: 10/12/2021: Hemoglobin 12.5; Platelets 236 12/01/2021: BUN 34; Creatinine, Ser 1.53; Potassium 4.7; Sodium 139  Lipid Panel    Component Value Date/Time   CHOL 125 10/07/2013 0812   TRIG 103.0 10/07/2013 0812   HDL 33.90 (L) 10/07/2013 0812   CHOLHDL 4 10/07/2013 0812   VLDL 20.6 10/07/2013 0812   LDLCALC 71 10/07/2013 0812      Wt Readings from Last 3 Encounters:  06/19/22 230 lb 4 oz (104.4 kg)  05/16/22 239 lb 10.2 oz (108.7 kg)  05/03/22 239 lb 10.2 oz (108.7 kg)          11/22/2015    2:32 PM  PAD Screen  Previous PAD dx? No  Previous surgical procedure? No  Pain with walking? No  Feet/toe relief with dangling? No  Painful, non-healing ulcers? No  Extremities discolored? No      ASSESSMENT AND PLAN:  1.   Chronic diastolic heart failure: She had gradual worsening of renal function over the last 6 months and has been having worsening orthostatic dizziness and low blood pressure.  I elected to hold irbesartan and spironolactone today.  She takes furosemide as needed and has not used this very often. Due to her heart failure, I recommend stopping pioglitazone and switching to Jardiance 10 mg daily.  Check basic metabolic profile in 1 week.  2.  Coronary artery disease with other forms of angina: She has moderate one-vessel coronary artery disease which should not be responsible for her symptoms.  Recommend continuing medical therapy.  3. Essential hypertension: She is significantly orthostatic today.  She has not taken irbesartan in the last 3 days and this will be held for now.  In addition, I discontinued spironolactone given worsening renal function and recent hyperkalemia.  If her blood pressure goes up,  will consider resuming ARB without spironolactone.   4. Hyperlipidemia: I reviewed most recent lipid profile done in July which showed an LDL of 56.  Continue treatment with rosuvastatin 40 mg daily.  Her LDL is at target.  5.  Peripheral arterial disease: Duplex showed borderline disease in her iliac arteries.  However, I do not think she has obstructive disease.  Her distal pulses are palpable and I reviewed the study.  The velocities in the iliac arteries were only borderline elevated below 300.   Disposition:   FU in 2 months.  Signed,  Lorine Bears, MD  06/19/2022 2:44 PM    [facility]

## 2022-06-19 NOTE — Patient Instructions (Signed)
Medication Instructions:  Your physician recommends the following medication changes.  STOP TAKING: Spironolactone Actos (Pioglitazone) Irbesartan  START TAKING: Jardiance 10 mg once daily  *If you need a refill on your cardiac medications before your next appointment, please call your pharmacy*   Lab Work: Your provider would like for you to return in one week to have the following labs drawn: BMET.   Please go to the [facility] entrance and check in at the front desk.  You do not need an appointment.  They are open from 7am-6 pm.  You will not need to be fasting.  If you have labs (blood work) drawn today and your tests are completely normal, you will receive your results only by: MyChart Message (if you have MyChart) OR A paper copy in the mail If you have any lab test that is abnormal or we need to change your treatment, we will call you to review the results.   Testing/Procedures: None ordered   Follow-Up: At [facility], you and your health needs are our priority.  As part of our continuing mission to provide you with exceptional heart care, we have created designated Provider Care Teams.  These Care Teams include your primary Cardiologist (physician) and Advanced Practice Providers (APPs -  Physician Assistants and Nurse Practitioners) who all work together to provide you with the care you need, when you need it.  We recommend signing up for the patient portal called "MyChart".  Sign up information is provided on this After Visit Summary.  MyChart is used to connect with patients for Virtual Visits (Telemedicine).  Patients are able to view lab/test results, encounter notes, upcoming appointments, etc.  Non-urgent messages can be sent to your provider as well.   To learn more about what you can do with MyChart, go to [email or web address].    Your next appointment:   2 month(s)  Provider:   You may see Lorine Bears, MD or one of the following  Advanced Practice Providers on your designated Care Team:   Nicolasa Ducking, NP Eula Listen, PA-C Cadence Fransico Michael, PA-C Charlsie Quest, NP

## 2022-06-26 ENCOUNTER — Ambulatory Visit
Admission: RE | Admit: 2022-06-26 | Discharge: 2022-06-26 | Disposition: A | Discharge status: Home / Self Care | Payer: Medicare Other | Source: Ambulatory Visit | Attending: Neurosurgery | Admitting: Neurosurgery

## 2022-06-26 ENCOUNTER — Other Ambulatory Visit: Payer: Self-pay

## 2022-06-26 ENCOUNTER — Encounter: Payer: Self-pay | Admitting: Neurosurgery

## 2022-06-26 ENCOUNTER — Ambulatory Visit (INDEPENDENT_AMBULATORY_CARE_PROVIDER_SITE_OTHER): Payer: Medicare Other | Admitting: Neurosurgery

## 2022-06-26 VITALS — BP 120/62

## 2022-06-26 DIAGNOSIS — G959 Disease of spinal cord, unspecified: Secondary | ICD-10-CM | POA: Insufficient documentation

## 2022-06-26 DIAGNOSIS — M4316 Spondylolisthesis, lumbar region: Secondary | ICD-10-CM

## 2022-06-26 DIAGNOSIS — R42 Dizziness and giddiness: Secondary | ICD-10-CM

## 2022-06-26 DIAGNOSIS — M4802 Spinal stenosis, cervical region: Secondary | ICD-10-CM

## 2022-06-26 NOTE — Progress Notes (Signed)
Referring Physician:  Patrice Paradise, MD [address] [facility] [place],  [place] [address]  Primary Physician:  Patrice Paradise, MD   DOS: 05/16/2022 (C4-6 ACDF)  History of Present Illness: 06/26/2022 She is doing well from her neck surgery.  She is back at work.  Her back continues to bother her.  She is having dizziness.  05/31/2022 Monica Ray is status post anterior cervical disc fusion.  Her strength has returned.  She continues to have back issues.  Her swallowing has returned to normal.  She still having some voice related changes..  04/19/2022 Monica Ray is here today with a chief complaint of back and leg pain.  She has been having pain for approximately 2 years that are now limiting her ability to walk.  She has significant back and posterior buttock pain that occurs when she stands and tries to walk.  Standing is also problematic for her.  Her pain improves when she sits down.  Most of her pain is between her hip and her knees.  This is now limiting her day-to-day activities by substantial amount.  She also reports some problems with shortness of breath that has been worked up to identify whether there is a proximate cause.  She has some heart failure that is being managed currently.  She does report some issues with balance.  She she has some hand numbness that has been impacting her.  Bowel/Bladder Dysfunction: none  Conservative measures:  Physical therapy: denies has not participated Multimodal medical therapy including regular antiinflammatories:  meloxicam, CBD cream,tramadol  Injections:  has not received cervical epidural steroid injections 10/13/2014: Left L4-5 transforaminal ESI 09/22/2014: Left L4-5 transforaminal ESI    Past Surgery:  lumbar decompression in 2016 by Dr Trixie Rude has symptoms of cervical myelopathy.  The symptoms are causing a significant impact on the patient's life.   I have  utilized the care everywhere function in epic to review the outside records available from external health systems.  Review of Systems:  A 10 point review of systems is negative, except for the pertinent positives and negatives detailed in the HPI.  Past Medical History: Past Medical History:  Diagnosis Date   Anemia    before hysterectomy   Anginal pain (HCC)    Aortic atherosclerosis (HCC)    Cervical myelopathy (HCC)    Cervical spinal stenosis    Chronic heart failure with preserved ejection fraction (HFpEF) (HCC)    a. 09/2021 Echo: EF 60-65%, no rwma, GrI DD, nl RV fxn, RVSP 47.70mmHg. Mildly dil LA; b. 10/2021 RHC: RA 15, RV 50/9, PA 48/21 (32), PCWP 21, LVEDP 27.   CKD (chronic kidney disease), stage III (HCC)    DDD (degenerative disc disease), lumbar    Depression    Diabetes mellitus, type 2 (HCC)    DM retinopathy (HCC)    Dyspnea on exertion    Edema    GERD (gastroesophageal reflux disease)    History of bilateral cataract extraction 2021   Hx of phlebitis    Hyperlipidemia    Hypertension    Lumbar stenosis    Mild allergic rhinitis    MRSA (methicillin resistant staph aureus) culture positive 03/2006   Nonobstructive CAD (coronary artery disease)    a. 12/2015 Cath: LAD min irregs, otw nl cors; b. 08/2018 Cor CTA: Mid/Distal LAD and OM1 dzs w/ abnl ctFFR; c. 09/2018 Cath: LM nl, LAD 40p, 33m, D1 50, LCX small-nl,  RCA nl, EF 55-65%; c. 10/2021 Cath: LM nl, LAD 40p/36m, lat D1 60, LCX sm nl, OM1 nl, RCA large, nl, RPDA/PAV/PL1-3 nl.   OA (osteoarthritis)    hands   Obesity    OSA on CPAP    Pulmonary HTN (HCC)    a.) R/LHC 12/08/2015: mPA 25, mPCWP 15, AO sat 93.5, CO 6.69, CI 3.04; b.) TTE 09/21/2021: RVSP 47.8; c.) R/LHC 10/23/2021: mPA 32, mPCWP 21, PA sat 73, CO 11.5    Past Surgical History: Past Surgical History:  Procedure Laterality Date   ABDOMINAL HYSTERECTOMY     ADENOIDECTOMY     ANTERIOR CERVICAL DECOMP/DISCECTOMY FUSION N/A 05/16/2022   Procedure:  C4-6 ANTERIOR CERVICAL DISCECTOMY AND FUSION;  Surgeon: Venetia Night, MD;  Location: [facility];  Service: Neurosurgery;  Laterality: N/A;   APPENDECTOMY  1989   CARDIAC CATHETERIZATION Bilateral 12/08/2015   Procedure: Right/Left Heart Cath and Coronary Angiography;  Surgeon: Iran Ouch, MD;  Location: [facility];  Service: Cardiovascular;  Laterality: Bilateral;   CATARACT EXTRACTION W/PHACO Left 05/12/2019   Procedure: CATARACT EXTRACTION PHACO AND INTRAOCULAR LENS PLACEMENT (IOC) LEFT VISION BLUE;  Surgeon: Elliot Cousin, MD;  Location: [facility];  Service: Ophthalmology;  Laterality: Left;  12.80 1:17.1   CATARACT EXTRACTION W/PHACO Right 06/11/2019   Procedure: CATARACT EXTRACTION PHACO AND INTRAOCULAR LENS PLACEMENT (IOC) RIGHT DIABETIC VISION BLUE 12.91  01:21.2;  Surgeon: Elliot Cousin, MD;  Location: [facility];  Service: Ophthalmology;  Laterality: Right;  Diabetic - insulin and oral meds   CHOLECYSTECTOMY  1989   LEFT HEART CATH AND CORONARY ANGIOGRAPHY Left 09/15/2018   Procedure: LEFT HEART CATH AND CORONARY ANGIOGRAPHY;  Surgeon: Iran Ouch, MD;  Location: [facility];  Service: Cardiovascular;  Laterality: Left;   LUMBAR LAMINECTOMY/DECOMPRESSION MICRODISCECTOMY N/A 11/25/2014   Procedure: LUMBAR LAMINECTOMY/DECOMPRESSION MICRODISCECTOMY LUMBAR TWO-THREE, LUMBAR THREE-FOUR ;  Surgeon: Tressie Stalker, MD;  Location: [facility];  Service: Neurosurgery;  Laterality: N/A;   RIGHT/LEFT HEART CATH AND CORONARY ANGIOGRAPHY Bilateral 10/23/2021   Procedure: RIGHT/LEFT HEART CATH AND CORONARY ANGIOGRAPHY;  Surgeon: Iran Ouch, MD;  Location: [facility];  Service: Cardiovascular;  Laterality: Bilateral;   ROTATOR CUFF REPAIR Right 08/2001    Allergies: Allergies as of 06/26/2022 - Review Complete 06/26/2022  Allergen Reaction Noted   Tegaderm ag mesh [silver] Rash 12/15/2015   Amoxicillin Hives and Rash 07/14/2013    Metformin Diarrhea and Nausea And Vomiting 11/16/2014   Doxycycline Nausea Only 06/19/2022   Oxycodone Itching 09/11/2018   Tape Other (See Comments) 09/11/2018   Azithromycin Rash and Itching 11/16/2014   Clindamycin Hives and Rash 11/16/2014   Fluconazole Itching    Simvastatin Other (See Comments) 11/16/2014    Medications: No outpatient medications have been marked as taking for the 06/26/22 encounter (Office Visit) with Venetia Night, MD.    Social History: Social History   Tobacco Use   Smoking status: Never   Smokeless tobacco: Never  Vaping Use   Vaping Use: Never used  Substance Use Topics   Alcohol use: No   Drug use: No    Family Medical History: Family History  Problem Relation Age of Onset   Cancer Mother 69       lung   Diabetes Mother    Alcohol abuse Mother    Alcohol abuse Father    Cancer Father        throat   Aneurysm Father    Diabetes Maternal Grandmother  Aneurysm Paternal Grandmother    Breast cancer Paternal Aunt        8's   Cancer Sister     Physical Examination: Vitals:   06/26/22 1058  BP: 120/62    General: Patient is well developed, well nourished, calm, collected, and in no apparent distress. Attention to examination is appropriate.  Neck:   Supple.  Full range of motion.  Respiratory: Patient is breathing without any difficulty.   NEUROLOGICAL:     Awake, alert, oriented to person, place, and time.  Speech is clear and fluent.   Cranial Nerves: Pupils equal round and reactive to light.  Facial tone is symmetric.  Facial sensation is symmetric. Shoulder shrug is symmetric. Tongue protrusion is midline.  There is no pronator drift.  ROM of spine: full.    Strength: Side Biceps Triceps Deltoid Interossei Grip Wrist Ext. Wrist Flex.  R 5 5 5 5 5 5 5   L 5 5 5 5 5 5 5    Side Iliopsoas Quads Hamstring PF DF EHL  R 5 5 5 5 5 5   L 5 5 5 5 5 5       Medical Decision Making  Imaging: MRI CL spine  03/19/2022 IMPRESSION: 1. Progressive multilevel cervical spondylosis, most pronounced at the C4-5 and C5-6 levels where there is severe canal stenosis. Mild cord compression at C4-5. 2. Severe right and moderate left foraminal stenosis at C5-6.     Electronically Signed   By: Duanne Guess D.O.   On: 03/20/2022 09:54  IMPRESSION: 1. Transitional anatomy, as above. 2. Multilevel degenerative changes of the lumbar spine, progressed at L1-L2 where there is severe canal stenosis with severe left and moderate right foraminal stenosis. 3. Severe right and moderate-severe left foraminal stenosis at L2-L3. 4. Moderate-severe bilateral foraminal stenosis at L3-L4.     Electronically Signed   By: Duanne Guess D.O.   On: 03/20/2022 10:06   I have personally reviewed the images and agree with the above interpretation.  Assessment and Plan: Monica Ray is a pleasant 77 y.o. female with cervical myelopathy s/p C4-6 anterior cervical discectomy and fusion.  She is doing well from the surgery.  I gave her exercises today.  Her back pain is a more complex issue.    We have determined that L1-4 lateral interbody fusion with percutaneous fixation is an appropriate midrange step to try to address her back pain.  She would need to achieve a goal weight of 210 pounds to consider this intervention.  She has already lost some weight.  She will continue working on this.  We will see her back in clinic in a few weeks.  She describes some dizziness which she has had before.  I will refer her to otolaryngology.    Krishan Mcbreen K. Myer Haff MD, [facility]

## 2022-07-02 ENCOUNTER — Other Ambulatory Visit
Admission: RE | Admit: 2022-07-02 | Discharge: 2022-07-02 | Disposition: A | Discharge status: Home / Self Care | Payer: Medicare Other | Attending: Cardiovascular Disease | Admitting: Cardiovascular Disease

## 2022-07-02 DIAGNOSIS — I5032 Chronic diastolic (congestive) heart failure: Secondary | ICD-10-CM | POA: Insufficient documentation

## 2022-07-02 LAB — BASIC METABOLIC PANEL
Anion gap: 11 (ref 5–15)
BUN: 22 mg/dL (ref 8–23)
CO2: 22 mmol/L (ref 22–32)
Calcium: 9.1 mg/dL (ref 8.9–10.3)
Chloride: 106 mmol/L (ref 98–111)
Creatinine, Ser: 1.35 mg/dL — ABNORMAL HIGH (ref 0.44–1.00)
GFR, Estimated: 41 mL/min — ABNORMAL LOW (ref >60)
Glucose, Bld: 95 mg/dL (ref 70–99)
Potassium: 4 mmol/L (ref 3.5–5.1)
Sodium: 139 mmol/L (ref 135–145)

## 2022-08-03 ENCOUNTER — Other Ambulatory Visit: Payer: Self-pay

## 2022-08-03 DIAGNOSIS — G959 Disease of spinal cord, unspecified: Secondary | ICD-10-CM

## 2022-08-06 NOTE — Progress Notes (Unsigned)
Referring Physician:  Patrice Paradise, MD [address] [facility] [place],  [place] [address]  Primary Physician:  Patrice Paradise, MD   DOS: 05/16/2022 C4-6 ACDF  History of Present Illness: Last seen by Dr. Myer Haff on 06/26/22 and she was doing well regarding her neck. She was back at work.   She was given cervical exercises at her last visit. He referred her to otolaryngology for dizziness.   She has known severe central stenosis L1-L2 with moderate/severe foraminal stenosis at L2-L4. He briefly discussed lumbar surgery with her. She has goal weight of 210 pounds.   She is here for follow up.   Her neck is doing well. She has no neck pain. She notes occasional numbness in her hands. She also notes occasional spasms in left hand. Saw ENT for dizziness and it has improved.   Still having the same back issues. She has back and buttock pain with any prolonged standing and walking. Pain is better when she sits.   She did PT for lumbar spine at [facility] at the end of last year.   She is working on weight loss.   Review of Systems:  A 10 point review of systems is negative, except for the pertinent positives and negatives detailed in the HPI.  Past Medical History: Past Medical History:  Diagnosis Date   Anemia    before hysterectomy   Anginal pain (HCC)    Aortic atherosclerosis (HCC)    Cervical myelopathy (HCC)    Cervical spinal stenosis    Chronic heart failure with preserved ejection fraction (HFpEF) (HCC)    a. 09/2021 Echo: EF 60-65%, no rwma, GrI DD, nl RV fxn, RVSP 47.43mmHg. Mildly dil LA; b. 10/2021 RHC: RA 15, RV 50/9, PA 48/21 (32), PCWP 21, LVEDP 27.   CKD (chronic kidney disease), stage III (HCC)    DDD (degenerative disc disease), lumbar    Depression    Diabetes mellitus, type 2 (HCC)    DM retinopathy (HCC)    Dyspnea on exertion    Edema    GERD (gastroesophageal reflux disease)    History of bilateral cataract extraction 2021    Hx of phlebitis    Hyperlipidemia    Hypertension    Lumbar stenosis    Mild allergic rhinitis    MRSA (methicillin resistant staph aureus) culture positive 03/2006   Nonobstructive CAD (coronary artery disease)    a. 12/2015 Cath: LAD min irregs, otw nl cors; b. 08/2018 Cor CTA: Mid/Distal LAD and OM1 dzs w/ abnl ctFFR; c. 09/2018 Cath: LM nl, LAD 40p, 60m, D1 50, LCX small-nl, RCA nl, EF 55-65%; c. 10/2021 Cath: LM nl, LAD 40p/86m, lat D1 60, LCX sm nl, OM1 nl, RCA large, nl, RPDA/PAV/PL1-3 nl.   OA (osteoarthritis)    hands   Obesity    OSA on CPAP    Pulmonary HTN (HCC)    a.) R/LHC 12/08/2015: mPA 25, mPCWP 15, AO sat 93.5, CO 6.69, CI 3.04; b.) TTE 09/21/2021: RVSP 47.8; c.) R/LHC 10/23/2021: mPA 32, mPCWP 21, PA sat 73, CO 11.5    Past Surgical History: Past Surgical History:  Procedure Laterality Date   ABDOMINAL HYSTERECTOMY     ADENOIDECTOMY     ANTERIOR CERVICAL DECOMP/DISCECTOMY FUSION N/A 05/16/2022   Procedure: C4-6 ANTERIOR CERVICAL DISCECTOMY AND FUSION;  Surgeon: Venetia Night, MD;  Location: [facility] ORS;  Service: Neurosurgery;  Laterality: N/A;   APPENDECTOMY  1989   CARDIAC CATHETERIZATION Bilateral 12/08/2015  Procedure: Right/Left Heart Cath and Coronary Angiography;  Surgeon: Iran Ouch, MD;  Location: [facility] INVASIVE CV [facility];  Service: Cardiovascular;  Laterality: Bilateral;   CATARACT EXTRACTION W/PHACO Left 05/12/2019   Procedure: CATARACT EXTRACTION PHACO AND INTRAOCULAR LENS PLACEMENT (IOC) LEFT VISION BLUE;  Surgeon: Elliot Cousin, MD;  Location: [facility];  Service: Ophthalmology;  Laterality: Left;  12.80 1:17.1   CATARACT EXTRACTION W/PHACO Right 06/11/2019   Procedure: CATARACT EXTRACTION PHACO AND INTRAOCULAR LENS PLACEMENT (IOC) RIGHT DIABETIC VISION BLUE 12.91  01:21.2;  Surgeon: Elliot Cousin, MD;  Location: [facility];  Service: Ophthalmology;  Laterality: Right;  Diabetic - insulin and oral meds   CHOLECYSTECTOMY  1989    LEFT HEART CATH AND CORONARY ANGIOGRAPHY Left 09/15/2018   Procedure: LEFT HEART CATH AND CORONARY ANGIOGRAPHY;  Surgeon: Iran Ouch, MD;  Location: [facility] INVASIVE CV LAB;  Service: Cardiovascular;  Laterality: Left;   LUMBAR LAMINECTOMY/DECOMPRESSION MICRODISCECTOMY N/A 11/25/2014   Procedure: LUMBAR LAMINECTOMY/DECOMPRESSION MICRODISCECTOMY LUMBAR TWO-THREE, LUMBAR THREE-FOUR ;  Surgeon: Tressie Stalker, MD;  Location: [facility];  Service: Neurosurgery;  Laterality: N/A;   RIGHT/LEFT HEART CATH AND CORONARY ANGIOGRAPHY Bilateral 10/23/2021   Procedure: RIGHT/LEFT HEART CATH AND CORONARY ANGIOGRAPHY;  Surgeon: Iran Ouch, MD;  Location: [facility] INV[facility] CV LAB;  Service: Cardiovascular;  Laterality: Bilateral;   ROTATOR CUFF REPAIR Right 08/2001    Allergies: Allergies as of 08/07/2022 - Review Complete 06/26/2022  Allergen Reaction Noted   Tegaderm ag mesh [silver] Rash 12/15/2015   Amoxicillin Hives and Rash 07/14/2013   Metformin Diarrhea and Nausea And Vomiting 11/16/2014   Doxycycline Nausea Only 06/19/2022   Oxycodone Itching 09/11/2018   Tape Other (See Comments) 09/11/2018   Azithromycin Rash and Itching 11/16/2014   Clindamycin Hives and Rash 11/16/2014   Fluconazole Itching    Simvastatin Other (See Comments) 11/16/2014    Medications: No outpatient medications have been marked as taking for the 08/07/22 encounter (Appointment) with Drake Leach, PA-C.    Social History: Social History   Tobacco Use   Smoking status: Never   Smokeless tobacco: Never  Vaping Use   Vaping Use: Never used  Substance Use Topics   Alcohol use: No   Drug use: No    Family Medical History: Family History  Problem Relation Age of Onset   Cancer Mother 57       lung   Diabetes Mother    Alcohol abuse Mother    Alcohol abuse Father    Cancer Father        throat   Aneurysm Father    Diabetes Maternal Grandmother    Aneurysm Paternal Grandmother    Breast cancer  Paternal Aunt        80's   Cancer Sister     Physical Examination: There were no vitals filed for this visit.    Awake, alert, oriented to person, place, and time.  Speech is clear and fluent.   Cranial Nerves: Pupils equal round and reactive to light.  Facial tone is symmetric.  Facial sensation is symmetric.   Strength: Side Biceps Triceps Deltoid Interossei Grip Wrist Ext. Wrist Flex.  R 5 5 5 5 5 5 5   L 5 5 5 5 5 5 5    Side Iliopsoas Quads Hamstring PF DF EHL  R 5 5 5 5 5 5   L 5 5 5 5 5 5     Cervical incision is well healed.   Sensation intact to light touch in bilateral upper  and lower extremities.   Medical Decision Making  Imaging: Cervical xrays dated 08/07/22:  No complications noted.   Radiology report for above imaging not yet available.   Assessment and Plan: Ms. Bredemeier is a pleasant 77 y.o. female with cervical myelopathy s/p C4-6 anterior cervical discectomy and fusion.  She is doing very well postop .   She continues with back and buttock pain with any prolonged standing and walking. History of lumbar surgery.   She has known severe central stenosis L1-L2 with moderate/severe foraminal stenosis at L2-L4.   Treatment options reviewed with patient and following plan made:   - Continue with weight loss. She is doing well. Goal weight for lumbar surgery 210 pounds.  - Discussed getting EMG to evaluate numbness in both hands and intermittent spasms in left hand. She declines, but will let me know if these symptoms get worse.  - Will get [facility] PT notes from last year for lumbar spine.  - Follow up with Dr. Myer Haff in 3 months for 6 month postop and to discuss further options for lumbar spine. She will need xrays prior to that visit.   Drake Leach PA-C [facility]

## 2022-08-07 ENCOUNTER — Ambulatory Visit
Admission: RE | Admit: 2022-08-07 | Discharge: 2022-08-07 | Disposition: A | Discharge status: Home / Self Care | Payer: Medicare Other | Attending: Orthopedic Surgery | Admitting: Orthopedic Surgery

## 2022-08-07 ENCOUNTER — Ambulatory Visit: Payer: Medicare Other | Admitting: Orthopedic Surgery

## 2022-08-07 ENCOUNTER — Ambulatory Visit
Admission: RE | Admit: 2022-08-07 | Discharge: 2022-08-07 | Disposition: A | Discharge status: Home / Self Care | Payer: Medicare Other | Source: Ambulatory Visit | Attending: Orthopedic Surgery | Admitting: Orthopedic Surgery

## 2022-08-07 ENCOUNTER — Encounter: Payer: Self-pay | Admitting: Orthopedic Surgery

## 2022-08-07 VITALS — BP 130/72 | Ht 64.0 in | Wt 230.0 lb

## 2022-08-07 DIAGNOSIS — G959 Disease of spinal cord, unspecified: Secondary | ICD-10-CM | POA: Diagnosis present

## 2022-08-07 DIAGNOSIS — Z981 Arthrodesis status: Secondary | ICD-10-CM

## 2022-08-07 DIAGNOSIS — M48062 Spinal stenosis, lumbar region with neurogenic claudication: Secondary | ICD-10-CM

## 2022-08-07 NOTE — Patient Instructions (Signed)
It was so nice to see you today. Thank you so much for coming in.    Your neck xrays looked good.   If numbness in hands gets worse, let me know and we can order a nerve test/EMG.   Continue to work on weight loss. You are doing great! Goal weight is 210 pounds.   You have a follow up with Dr. Myer Haff in October. You will need to get repeat neck xrays prior to this visit.   Please do not hesitate to call if you have any questions or concerns. You can also message me in MyChart.   Drake Leach PA-C [phone]

## 2022-09-06 ENCOUNTER — Encounter: Payer: Self-pay | Admitting: Cardiovascular Disease

## 2022-09-06 ENCOUNTER — Ambulatory Visit: Payer: Medicare Other | Attending: Cardiovascular Disease | Admitting: Cardiovascular Disease

## 2022-09-06 VITALS — BP 140/70 | HR 75 | Ht 64.0 in | Wt 212.8 lb

## 2022-09-06 DIAGNOSIS — I10 Essential (primary) hypertension: Secondary | ICD-10-CM | POA: Diagnosis present

## 2022-09-06 DIAGNOSIS — I5032 Chronic diastolic (congestive) heart failure: Secondary | ICD-10-CM | POA: Insufficient documentation

## 2022-09-06 DIAGNOSIS — I739 Peripheral vascular disease, unspecified: Secondary | ICD-10-CM | POA: Insufficient documentation

## 2022-09-06 DIAGNOSIS — I25118 Atherosclerotic heart disease of native coronary artery with other forms of angina pectoris: Secondary | ICD-10-CM | POA: Insufficient documentation

## 2022-09-06 DIAGNOSIS — E785 Hyperlipidemia, unspecified: Secondary | ICD-10-CM | POA: Diagnosis present

## 2022-09-06 MED ORDER — FUROSEMIDE 40 MG PO TABS: 20.0000 mg | ORAL_TABLET | ORAL | 0 refills | Status: DC | PRN

## 2022-09-06 MED ORDER — DILTIAZEM HCL ER COATED BEADS 120 MG PO CP24: 120.0000 mg | ORAL_CAPSULE | Freq: Every day | ORAL | 3 refills | Status: DC

## 2022-09-06 NOTE — Progress Notes (Signed)
Cardiology Office Note   Date:  09/06/2022   ID:  Mackenzy, Twaddle 06-10-1945, MRN [id number]  PCP:  Patrice Paradise, MD  Cardiologist:   Lorine Bears, MD   Chief Complaint  Patient presents with   Follow-up    2 month follow up. Patient is doing well on today. Meds reviewed.        History of Present Illness: Monica Ray is a 77 y.o. female who presents for a follow-up visit regarding chronic diastolic heart failure and nonobstructive coronary artery disease. She has multiple chronic medical conditions that include prolonged history of type 2 diabetes , essential hypertension, sleep apnea with poor intolerance to CPAP, CKD, hyperlipidemia and obesity. She is a lifelong nonsmoker.  She had previous back surgery. Previous  right and left cardiac catheterization in November 2017 showed normal LV systolic function with mildly elevated left ventricular end-diastolic pressure and mild pulmonary hypertension. There was mild nonobstructive coronary artery disease.   Pulmonary pressure was 37/17 with a mean of 25 mmHg. LVEDP was 18 mmHg.  She had recurrent chest pain in July 2020 that was evaluated by CTA of the coronary arteries.which showed moderate LAD disease which was significant by FFR.  Coronary calcium score was 120.  Due to that, a left heart catheterization was done in August 2020 which showed 40% proximal LAD disease and 50% ostial first diagonal disease.  Ejection fraction was normal with moderately elevated left ventricular end-diastolic pressure in the setting of moderately elevated blood pressure.  She had recurrent heart failure symptoms in 2023. She underwent cardiac catheterization in September, 2023 which showed stable moderate one-vessel coronary artery disease involving the LAD.  Right heart catheterization showed moderately elevated right and left-sided filling pressures, mild pulmonary hypertension and high cardiac output.  She was placed on spironolactone. Due  to low back pain, arterial Doppler studies were done which showed normal ABI bilaterally with some abnormal waveforms in the common femoral artery.  Duplex showed borderline disease in the iliac arteries bilaterally.  She had cervical spine surgery in April by Dr. Marcell Barlow.  She reports no significant improvement in her symptoms since the surgery.  She had significant orthostatic dizziness and low blood pressure after that in spite of holding her irbesartan.  She was seen in the office and was noted to be orthostatic.  She also had gradual decline in renal function with creatinine slightly above 2.  During last visit, I discontinued irbesartan and spironolactone.  I switched Actos to Jardiance.   She felt significantly better after that and she is doing well at the present time with less shortness of breath and no chest pain.  She has been losing weight with Ozempic.  Past Medical History:  Diagnosis Date   Anemia    before hysterectomy   Anginal pain (HCC)    Aortic atherosclerosis (HCC)    Cervical myelopathy (HCC)    Cervical spinal stenosis    Chronic heart failure with preserved ejection fraction (HFpEF) (HCC)    a. 09/2021 Echo: EF 60-65%, no rwma, GrI DD, nl RV fxn, RVSP 47.39mmHg. Mildly dil LA; b. 10/2021 RHC: RA 15, RV 50/9, PA 48/21 (32), PCWP 21, LVEDP 27.   CKD (chronic kidney disease), stage III (HCC)    DDD (degenerative disc disease), lumbar    Depression    Diabetes mellitus, type 2 (HCC)    DM retinopathy (HCC)    Dyspnea on exertion    Edema    GERD (gastroesophageal  reflux disease)    History of bilateral cataract extraction 2021   Hx of phlebitis    Hyperlipidemia    Hypertension    Lumbar stenosis    Mild allergic rhinitis    MRSA (methicillin resistant staph aureus) culture positive 03/2006   Nonobstructive CAD (coronary artery disease)    a. 12/2015 Cath: LAD min irregs, otw nl cors; b. 08/2018 Cor CTA: Mid/Distal LAD and OM1 dzs w/ abnl ctFFR; c. 09/2018 Cath: LM  nl, LAD 40p, 8m, D1 50, LCX small-nl, RCA nl, EF 55-65%; c. 10/2021 Cath: LM nl, LAD 40p/39m, lat D1 60, LCX sm nl, OM1 nl, RCA large, nl, RPDA/PAV/PL1-3 nl.   OA (osteoarthritis)    hands   Obesity    OSA on CPAP    Pulmonary HTN (HCC)    a.) R/LHC 12/08/2015: mPA 25, mPCWP 15, AO sat 93.5, CO 6.69, CI 3.04; b.) TTE 09/21/2021: RVSP 47.8; c.) R/LHC 10/23/2021: mPA 32, mPCWP 21, PA sat 73, CO 11.5    Past Surgical History:  Procedure Laterality Date   ABDOMINAL HYSTERECTOMY     ADENOIDECTOMY     ANTERIOR CERVICAL DECOMP/DISCECTOMY FUSION N/A 05/16/2022   Procedure: C4-6 ANTERIOR CERVICAL DISCECTOMY AND FUSION;  Surgeon: Venetia Night, MD;  Location: [facility] ORS;  Service: Neurosurgery;  Laterality: N/A;   APPENDECTOMY  1989   CARDIAC CATHETERIZATION Bilateral 12/08/2015   Procedure: Right/Left Heart Cath and Coronary Angiography;  Surgeon: Iran Ouch, MD;  Location: [facility];  Service: Cardiovascular;  Laterality: Bilateral;   CATARACT EXTRACTION W/PHACO Left 05/12/2019   Procedure: CATARACT EXTRACTION PHACO AND INTRAOCULAR LENS PLACEMENT (IOC) LEFT VISION BLUE;  Surgeon: Elliot Cousin, MD;  Location: [facility];  Service: Ophthalmology;  Laterality: Left;  12.80 1:17.1   CATARACT EXTRACTION W/PHACO Right 06/11/2019   Procedure: CATARACT EXTRACTION PHACO AND INTRAOCULAR LENS PLACEMENT (IOC) RIGHT DIABETIC VISION BLUE 12.91  01:21.2;  Surgeon: Elliot Cousin, MD;  Location: [facility];  Service: Ophthalmology;  Laterality: Right;  Diabetic - insulin and oral meds   CHOLECYSTECTOMY  1989   LEFT HEART CATH AND CORONARY ANGIOGRAPHY Left 09/15/2018   Procedure: LEFT HEART CATH AND CORONARY ANGIOGRAPHY;  Surgeon: Iran Ouch, MD;  Location: [facility];  Service: Cardiovascular;  Laterality: Left;   LUMBAR LAMINECTOMY/DECOMPRESSION MICRODISCECTOMY N/A 11/25/2014   Procedure: LUMBAR LAMINECTOMY/DECOMPRESSION MICRODISCECTOMY LUMBAR TWO-THREE,  LUMBAR THREE-FOUR ;  Surgeon: Tressie Stalker, MD;  Location: [facility];  Service: Neurosurgery;  Laterality: N/A;   RIGHT/LEFT HEART CATH AND CORONARY ANGIOGRAPHY Bilateral 10/23/2021   Procedure: RIGHT/LEFT HEART CATH AND CORONARY ANGIOGRAPHY;  Surgeon: Iran Ouch, MD;  Location: [facility];  Service: Cardiovascular;  Laterality: Bilateral;   ROTATOR CUFF REPAIR Right 08/2001     Current Outpatient Medications  Medication Sig Dispense Refill   ascorbic acid (VITAMIN C) 500 MG tablet Take 250 mg by mouth daily.     CALCIUM PO Take 1 tablet by mouth daily in the afternoon. Gummy     Cholecalciferol (VITAMIN D3) 125 MCG (5000 UT) TABS Take 5,000 Units by mouth daily.      clotrimazole-betamethasone (LOTRISONE) cream Apply 1 application topically 2 (two) times daily as needed (rash).      DULoxetine (CYMBALTA) 60 MG capsule Take 60 mg by mouth every morning.     empagliflozin (JARDIANCE) 10 MG TABS tablet Take 1 tablet (10 mg total) by mouth daily before breakfast. 30 tablet 3   insulin lispro (HUMALOG) 100 UNIT/ML injection  Inject 8-24 Units into the skin 3 (three) times daily with meals. 24 units before breakfast and lunch and 28 units before supper - plus sliding scale (4 units per 50 over a target of 150) USES SS     nitroGLYCERIN (NITROSTAT) 0.4 MG SL tablet Place 1 tablet (0.4 mg total) under the tongue every 5 (five) minutes as needed for chest pain. 25 tablet 3   OZEMPIC, 2 MG/DOSE, 8 MG/3ML SOPN Inject 2 mg as directed once a week. Mondays     rosuvastatin (CRESTOR) 40 MG tablet TAKE 1 TABLET(40 MG) BY MOUTH DAILY (Patient taking differently: Take 40 mg by mouth every morning.) 90 tablet 0   TOUJEO MAX SOLOSTAR 300 UNIT/ML Solostar Pen Inject 50 Units into the skin at bedtime.     vitamin B-12 (CYANOCOBALAMIN) 500 MCG tablet Take 500 mcg by mouth daily.     diltiazem (CARDIZEM CD) 120 MG 24 hr capsule Take 1 capsule (120 mg total) by mouth daily. 90 capsule 3    furosemide (LASIX) 40 MG tablet Take 0.5-1 tablets (20-40 mg total) by mouth as needed. 90 tablet 0   methocarbamol (ROBAXIN) 500 MG tablet Take 1 tablet (500 mg total) by mouth every 6 (six) hours as needed for muscle spasms. 60 tablet 0   No current facility-administered medications for this visit.    Allergies:   Tegaderm ag mesh [silver], Amoxicillin, Metformin, Doxycycline, Oxycodone, Tape, Azithromycin, Clindamycin, Fluconazole, and Simvastatin    Social History:  The patient  reports that she has never smoked. She has never used smokeless tobacco. She reports that she does not drink alcohol and does not use drugs.   Family History:  The patient's family history includes Alcohol abuse in her father and mother; Aneurysm in her father and paternal grandmother; Breast cancer in her paternal aunt; Cancer in her father and sister; Cancer (age of onset: 69) in her mother; Diabetes in her maternal grandmother and mother.    ROS:  Please see the history of present illness.   Otherwise, review of systems are positive for none.   All other systems are reviewed and negative.    PHYSICAL EXAM: VS:  BP (!) 140/70 (BP Location: Left Arm, Patient Position: Sitting, Cuff Size: Large)   Pulse 75   Ht 5\' 4"  (1.626 m)   Wt 212 lb 12.8 oz (96.5 kg)   SpO2 97%   BMI 36.53 kg/m  , BMI Body mass index is 36.53 kg/m. GEN: Well nourished, well developed, in no acute distress  HEENT: normal  Neck: no JVD, carotid bruits, or masses Cardiac: RRR; no murmurs, rubs, or gallops,no edema  Respiratory:  clear to auscultation bilaterally, normal work of breathing GI: soft, nontender, nondistended, + BS MS: no deformity or atrophy  Skin: warm and dry, no rash Neuro:  Strength and sensation are intact Psych: euthymic mood, full affect Distal pulses are palpable.   EKG:  EKG is  not ordered today.    Recent Labs: 10/12/2021: Hemoglobin 12.5; Platelets 236 07/02/2022: BUN 22; Creatinine, Ser 1.35;  Potassium 4.0; Sodium 139    Lipid Panel    Component Value Date/Time   CHOL 125 10/07/2013 0812   TRIG 103.0 10/07/2013 0812   HDL 33.90 (L) 10/07/2013 0812   CHOLHDL 4 10/07/2013 0812   VLDL 20.6 10/07/2013 0812   LDLCALC 71 10/07/2013 0812      Wt Readings from Last 3 Encounters:  09/06/22 212 lb 12.8 oz (96.5 kg)  08/07/22 230 lb (104.3 kg)  06/19/22 230 lb 4 oz (104.4 kg)          11/22/2015    2:32 PM  PAD Screen  Previous PAD dx? No  Previous surgical procedure? No  Pain with walking? No  Feet/toe relief with dangling? No  Painful, non-healing ulcers? No  Extremities discolored? No      ASSESSMENT AND PLAN:  1.   Chronic diastolic heart failure: She appears to be euvolemic at the present time.  She takes furosemide as needed and has not required this in the last few weeks.  She is doing well with Jardiance 10 mg daily.   Her shortness of breath seems to be gradually improving as she loses more weight.  2.  Coronary artery disease with other forms of angina: She has moderate one-vessel coronary artery disease which should not be responsible for her symptoms.  Recommend continuing medical therapy.  3. Essential hypertension: She was orthostatic with borderline blood pressure during last visit but since then both irbesartan and spironolactone were discontinued.  Her blood pressure is borderline elevated today but I elected not to add any new medications at the present time.  Continue diltiazem.  If blood pressure remains elevated, we could consider reintroducing small dose ARB.   4. Hyperlipidemia: I reviewed most recent lipid profile done in July which showed an LDL of 56.  Continue treatment with rosuvastatin 40 mg daily.  Her LDL is at target.  5.  Peripheral arterial disease: Duplex showed borderline disease in her iliac arteries.  However, I do not think she has obstructive disease.  Her distal pulses are palpable and I reviewed the study.  The velocities in  the iliac arteries were only borderline elevated below 300.   Disposition:   FU in 6 months.  Signed,  Lorine Bears, MD  09/06/2022 12:07 PM    [facility]

## 2022-09-06 NOTE — Patient Instructions (Signed)
Medication Instructions:  No changes *If you need a refill on your cardiac medications before your next appointment, please call your pharmacy*   Lab Work: None ordered If you have labs (blood work) drawn today and your tests are completely normal, you will receive your results only by: MyChart Message (if you have MyChart) OR A paper copy in the mail If you have any lab test that is abnormal or we need to change your treatment, we will call you to review the results.   Testing/Procedures: None ordered   Follow-Up: At [facility], you and your health needs are our priority.  As part of our continuing mission to provide you with exceptional heart care, we have created designated Provider Care Teams.  These Care Teams include your primary Cardiologist (physician) and Advanced Practice Providers (APPs -  Physician Assistants and Nurse Practitioners) who all work together to provide you with the care you need, when you need it.  We recommend signing up for the patient portal called "MyChart".  Sign up information is provided on this After Visit Summary.  MyChart is used to connect with patients for Virtual Visits (Telemedicine).  Patients are able to view lab/test results, encounter notes, upcoming appointments, etc.  Non-urgent messages can be sent to your provider as well.   To learn more about what you can do with MyChart, go to [email or web address].    Your next appointment:   6 month(s)  Provider:   You may see Muhammad Arida, MD or one of the following Advanced Practice Providers on your designated Care Team:   Christopher Berge, NP Ryan Dunn, PA-C Cadence Furth, PA-C Sheri Hammock, NP    

## 2022-10-14 ENCOUNTER — Other Ambulatory Visit: Payer: Self-pay | Admitting: Cardiovascular Disease

## 2022-11-08 ENCOUNTER — Ambulatory Visit
Admission: RE | Admit: 2022-11-08 | Discharge: 2022-11-08 | Disposition: A | Discharge status: Home / Self Care | Payer: Medicare Other | Source: Ambulatory Visit | Attending: Orthopedic Surgery | Admitting: Orthopedic Surgery

## 2022-11-08 ENCOUNTER — Ambulatory Visit
Admission: RE | Admit: 2022-11-08 | Discharge: 2022-11-08 | Disposition: A | Discharge status: Home / Self Care | Payer: Medicare Other | Attending: Orthopedic Surgery | Admitting: Orthopedic Surgery

## 2022-11-08 DIAGNOSIS — G959 Disease of spinal cord, unspecified: Secondary | ICD-10-CM | POA: Insufficient documentation

## 2022-11-08 DIAGNOSIS — Z981 Arthrodesis status: Secondary | ICD-10-CM | POA: Diagnosis present

## 2022-11-13 ENCOUNTER — Ambulatory Visit (INDEPENDENT_AMBULATORY_CARE_PROVIDER_SITE_OTHER): Payer: Medicare Other | Admitting: Neurosurgery

## 2022-11-13 ENCOUNTER — Encounter: Payer: Self-pay | Admitting: Neurosurgery

## 2022-11-13 VITALS — BP 128/68 | Ht 64.0 in | Wt 202.0 lb

## 2022-11-13 DIAGNOSIS — M419 Scoliosis, unspecified: Secondary | ICD-10-CM | POA: Diagnosis not present

## 2022-11-13 DIAGNOSIS — M5442 Lumbago with sciatica, left side: Secondary | ICD-10-CM | POA: Diagnosis not present

## 2022-11-13 DIAGNOSIS — M48062 Spinal stenosis, lumbar region with neurogenic claudication: Secondary | ICD-10-CM

## 2022-11-13 DIAGNOSIS — G8929 Other chronic pain: Secondary | ICD-10-CM

## 2022-11-13 DIAGNOSIS — M4316 Spondylolisthesis, lumbar region: Secondary | ICD-10-CM

## 2022-11-13 DIAGNOSIS — M5441 Lumbago with sciatica, right side: Secondary | ICD-10-CM

## 2022-11-13 NOTE — Progress Notes (Signed)
Referring Physician:  Patrice Paradise, MD [address] [facility] [place],  [place] [address]  Primary Physician:  Patrice Paradise, MD   DOS: 05/16/2022 (C4-6 ACDF)  History of Present Illness: 11/13/2022 Ms. Woodmansee is doing very well from her neck surgery.  She has lost 50 pounds over the past 6 months.  She continues to have life limiting back pain.   06/26/2022 She is doing well from her neck surgery.  She is back at work.  Her back continues to bother her.  She is having dizziness.  05/31/2022 Ms. Kayson Bullis is status post anterior cervical disc fusion.  Her strength has returned.  She continues to have back issues.  Her swallowing has returned to normal.  She still having some voice related changes..  04/19/2022 Ms. Kandra Graven is here today with a chief complaint of back and leg pain.  She has been having pain for approximately 2 years that are now limiting her ability to walk.  She has significant back and posterior buttock pain that occurs when she stands and tries to walk.  Standing is also problematic for her.  Her pain improves when she sits down.  Most of her pain is between her hip and her knees.  This is now limiting her day-to-day activities by substantial amount.  She also reports some problems with shortness of breath that has been worked up to identify whether there is a proximate cause.  She has some heart failure that is being managed currently.  She does report some issues with balance.  She she has some hand numbness that has been impacting her.  Bowel/Bladder Dysfunction: none  Conservative measures:  Physical therapy: denies has not participated Multimodal medical therapy including regular antiinflammatories:  meloxicam, CBD cream,tramadol  Injections:  has not received cervical epidural steroid injections 10/13/2014: Left L4-5 transforaminal ESI 09/22/2014: Left L4-5 transforaminal ESI    Past Surgery:  lumbar decompression  in 2016 by Dr Trixie Rude has symptoms of cervical myelopathy.  The symptoms are causing a significant impact on the patient's life.   I have utilized the care everywhere function in epic to review the outside records available from external health systems.  Review of Systems:  A 10 point review of systems is negative, except for the pertinent positives and negatives detailed in the HPI.  Past Medical History: Past Medical History:  Diagnosis Date   Anemia    before hysterectomy   Anginal pain (HCC)    Aortic atherosclerosis (HCC)    Cervical myelopathy (HCC)    Cervical spinal stenosis    Chronic heart failure with preserved ejection fraction (HFpEF) (HCC)    a. 09/2021 Echo: EF 60-65%, no rwma, GrI DD, nl RV fxn, RVSP 47.58mmHg. Mildly dil LA; b. 10/2021 RHC: RA 15, RV 50/9, PA 48/21 (32), PCWP 21, LVEDP 27.   CKD (chronic kidney disease), stage III (HCC)    DDD (degenerative disc disease), lumbar    Depression    Diabetes mellitus, type 2 (HCC)    DM retinopathy (HCC)    Dyspnea on exertion    Edema    GERD (gastroesophageal reflux disease)    History of bilateral cataract extraction 2021   Hx of phlebitis    Hyperlipidemia    Hypertension    Lumbar stenosis    Mild allergic rhinitis    MRSA (methicillin resistant staph aureus) culture positive 03/2006   Nonobstructive CAD (coronary artery disease)  a. 12/2015 Cath: LAD min irregs, otw nl cors; b. 08/2018 Cor CTA: Mid/Distal LAD and OM1 dzs w/ abnl ctFFR; c. 09/2018 Cath: LM nl, LAD 40p, 70m, D1 50, LCX small-nl, RCA nl, EF 55-65%; c. 10/2021 Cath: LM nl, LAD 40p/68m, lat D1 60, LCX sm nl, OM1 nl, RCA large, nl, RPDA/PAV/PL1-3 nl.   OA (osteoarthritis)    hands   Obesity    OSA on CPAP    Pulmonary HTN (HCC)    a.) R/LHC 12/08/2015: mPA 25, mPCWP 15, AO sat 93.5, CO 6.69, CI 3.04; b.) TTE 09/21/2021: RVSP 47.8; c.) R/LHC 10/23/2021: mPA 32, mPCWP 21, PA sat 73, CO 11.5    Past Surgical History: Past Surgical  History:  Procedure Laterality Date   ABDOMINAL HYSTERECTOMY     ADENOIDECTOMY     ANTERIOR CERVICAL DECOMP/DISCECTOMY FUSION N/A 05/16/2022   Procedure: C4-6 ANTERIOR CERVICAL DISCECTOMY AND FUSION;  Surgeon: Venetia Night, MD;  Location: [facility] ORS;  Service: Neurosurgery;  Laterality: N/A;   APPENDECTOMY  1989   CARDIAC CATHETERIZATION Bilateral 12/08/2015   Procedure: Right/Left Heart Cath and Coronary Angiography;  Surgeon: Iran Ouch, MD;  Location: [facility] INVASIVE CV LAB;  Service: Cardiovascular;  Laterality: Bilateral;   CATARACT EXTRACTION W/PHACO Left 05/12/2019   Procedure: CATARACT EXTRACTION PHACO AND INTRAOCULAR LENS PLACEMENT (IOC) LEFT VISION BLUE;  Surgeon: Elliot Cousin, MD;  Location: [facility];  Service: Ophthalmology;  Laterality: Left;  12.80 1:17.1   CATARACT EXTRACTION W/PHACO Right 06/11/2019   Procedure: CATARACT EXTRACTION PHACO AND INTRAOCULAR LENS PLACEMENT (IOC) RIGHT DIABETIC VISION BLUE 12.91  01:21.2;  Surgeon: Elliot Cousin, MD;  Location: [facility];  Service: Ophthalmology;  Laterality: Right;  Diabetic - insulin and oral meds   CHOLECYSTECTOMY  1989   LEFT HEART CATH AND CORONARY ANGIOGRAPHY Left 09/15/2018   Procedure: LEFT HEART CATH AND CORONARY ANGIOGRAPHY;  Surgeon: Iran Ouch, MD;  Location: [facility] INVASIVE CV LAB;  Service: Cardiovascular;  Laterality: Left;   LUMBAR LAMINECTOMY/DECOMPRESSION MICRODISCECTOMY N/A 11/25/2014   Procedure: LUMBAR LAMINECTOMY/DECOMPRESSION MICRODISCECTOMY LUMBAR TWO-THREE, LUMBAR THREE-FOUR ;  Surgeon: Tressie Stalker, MD;  Location: [facility];  Service: Neurosurgery;  Laterality: N/A;   RIGHT/LEFT HEART CATH AND CORONARY ANGIOGRAPHY Bilateral 10/23/2021   Procedure: RIGHT/LEFT HEART CATH AND CORONARY ANGIOGRAPHY;  Surgeon: Iran Ouch, MD;  Location: [facility] INVASIVE CV LAB;  Service: Cardiovascular;  Laterality: Bilateral;   ROTATOR CUFF REPAIR Right 08/2001    Allergies: Allergies  as of 11/13/2022 - Review Complete 09/06/2022  Allergen Reaction Noted   Tegaderm ag mesh [silver] Rash 12/15/2015   Amoxicillin Hives and Rash 07/14/2013   Metformin Diarrhea and Nausea And Vomiting 11/16/2014   Doxycycline Nausea Only 06/19/2022   Oxycodone Itching 09/11/2018   Tape Other (See Comments) 09/11/2018   Azithromycin Rash and Itching 11/16/2014   Clindamycin Hives and Rash 11/16/2014   Fluconazole Itching    Simvastatin Other (See Comments) 11/16/2014    Medications: No outpatient medications have been marked as taking for the 11/13/22 encounter (Appointment) with Venetia Night, MD.    Social History: Social History   Tobacco Use   Smoking status: Never   Smokeless tobacco: Never  Vaping Use   Vaping status: Never Used  Substance Use Topics   Alcohol use: No   Drug use: No    Family Medical History: Family History  Problem Relation Age of Onset   Cancer Mother 63       lung   Diabetes Mother  Alcohol abuse Mother    Alcohol abuse Father    Cancer Father        throat   Aneurysm Father    Diabetes Maternal Grandmother    Aneurysm Paternal Grandmother    Breast cancer Paternal Aunt        88's   Cancer Sister     Physical Examination: There were no vitals filed for this visit.   General: Patient is well developed, well nourished, calm, collected, and in no apparent distress. Attention to examination is appropriate.  Neck:   Supple.  Full range of motion.  Respiratory: Patient is breathing without any difficulty.   NEUROLOGICAL:     Awake, alert, oriented to person, place, and time.  Speech is clear and fluent.   Cranial Nerves: Pupils equal round and reactive to light.  Facial tone is symmetric.  Facial sensation is symmetric. Shoulder shrug is symmetric. Tongue protrusion is midline.  There is no pronator drift.  ROM of spine: full.    Strength: Side Biceps Triceps Deltoid Interossei Grip Wrist Ext. Wrist Flex.  R 5 5 5 5 5 5 5    L 5 5 5 5 5 5 5    Side Iliopsoas Quads Hamstring PF DF EHL  R 5 5 5 5 5 5   L 5 5 5 5 5 5       Medical Decision Making  Imaging: MRI CL spine 03/19/2022 IMPRESSION: 1. Progressive multilevel cervical spondylosis, most pronounced at the C4-5 and C5-6 levels where there is severe canal stenosis. Mild cord compression at C4-5. 2. Severe right and moderate left foraminal stenosis at C5-6.     Electronically Signed   By: Duanne Guess D.O.   On: 03/20/2022 09:54  IMPRESSION: 1. Transitional anatomy, as above. 2. Multilevel degenerative changes of the lumbar spine, progressed at L1-L2 where there is severe canal stenosis with severe left and moderate right foraminal stenosis. 3. Severe right and moderate-severe left foraminal stenosis at L2-L3. 4. Moderate-severe bilateral foraminal stenosis at L3-L4.     Electronically Signed   By: Duanne Guess D.O.   On: 03/20/2022 10:06   I have personally reviewed the images and agree with the above interpretation.  Assessment and Plan: Ms. Purnell is a pleasant 77 y.o. female with cervical myelopathy s/p C4-6 anterior cervical discectomy and fusion.  She is doing well from the surgery.   Her back pain is a more complex issue.    We have determined that L1-4 lateral interbody fusion with percutaneous fixation is an appropriate midrange step to try to address her back pain.  She has achieved her goal weight.  We will move forward with surgical planning.  I discussed the planned procedure at length with the patient, including the risks, benefits, alternatives, and indications. The risks discussed include but are not limited to bleeding, infection, need for reoperation, spinal fluid leak, stroke, vision loss, anesthetic complication, coma, paralysis, and even death. I also described the possibility of psoas weakness and paresthesias. I described in detail that improvement was not guaranteed.  The patient expressed understanding of  these risks, and asked that we proceed with surgery. I described the surgery in layman's terms, and gave ample opportunity for questions, which were answered to the best of my ability.  I spent a total of 30 minutes in this patient's care today. This time was spent reviewing pertinent records including imaging studies, obtaining and confirming history, performing a directed evaluation, formulating and discussing my recommendations, and documenting the visit  within the medical record.       Jadalyn Oliveri K. Myer Haff MD, [facility] Neurosurgery

## 2022-11-13 NOTE — Patient Instructions (Signed)
Please see below for information in regards to your upcoming surgery:   Planned surgery: L1-4 lateral lumbar interbody fusion and posterior spinal fusion   Surgery date: 12/24/22 at [facility] (Medical Mall: [address] [place], [place] [address]) - you will find out your arrival time the business day before your surgery.   Pre-op appointment at [facility] Pre-admit Testing: we will call you with a date/time for this. If you are scheduled for an in person appointment, Pre-admit Testing is located on the first floor of the [facility] building, [address], [address]. Please bring all prescriptions in the original prescription bottles to your appointment. During this appointment, they will advise you which medications you can take the morning of surgery, and which medications you will need to hold for surgery. Labs (such as blood work, EKG) may be done at your pre-op appointment. You are not required to fast for these labs. Should you need to change your pre-op appointment, please call Pre-admit testing at [phone].      Diabetes/weight loss medications:  Ozempic injection: hold 7 days prior to surgery Empagliflozin (Jardiance): hold 3 days prior to surgery     Surgical clearance: we will send a clearance form to Maurine Minister, PA     Brace: You will need to bring the brace to the hospital on the day of surgery. [facility] will contact you regarding an appointment for the brace you will use after surgery. If it is getting close to your surgery date and you have not received an appointment with [facility], please reach out to [place].  Their number is [phone] should you miss their call or have an issue with your brace after surgery.     NSAIDS (Non-steroidal anti-inflammatory drugs): because you are having a fusion, please avoid taking any NSAIDS (examples: ibuprofen, motrin, aleve, naproxen, meloxicam, diclofenac) for 3 months after  surgery. Celebrex is an exception and is OK to take, if prescribed. Tylenol is not an NSAID.    Common restrictions after surgery: No bending, lifting, or twisting ("BLT"). Avoid lifting objects heavier than 10 pounds for the first 6 weeks after surgery. Where possible, avoid household activities that involve lifting, bending, reaching, pushing, or pulling such as laundry, vacuuming, grocery shopping, and childcare. Try to arrange for help from friends and family for these activities while you heal. Do not drive while taking prescription pain medication. Weeks 6 through 12 after surgery: avoid lifting more than 25 pounds.    X-rays after surgery: Because you are having a fusion: for appointments after your 2 week follow-up: please arrive at the [facility] outpatient imaging center ([address], [address], [place]) or [facility] one hour prior to your appointment for x-rays. This applies to every appointment after your 2 week follow-up. Failure to do so may result in your appointment being rescheduled.   How to contact us:  If you have any questions/concerns before or after surgery, you can reach [place] at [phone], or you can send a mychart message. We can be reached by phone or mychart 8am-4pm, Monday-Friday.  *Please note: Calls after 4pm are forwarded to a third party answering service. Mychart messages are not routinely monitored during evenings, weekends, and holidays. Please call our office to contact the answering service for urgent concerns during non-business hours.    Appointments/FMLA & disability paperwork: Joycelyn Rua, & Flonnie Hailstone Registered Nurse/Surgery scheduler: Royston Cowper Medical Assistants: Nash Mantis Physician Assistants: Manning Charity, PA-C &  Drake Leach, PA-C Surgeons: Venetia Night, MD & Ernestine Mcmurray, MD

## 2022-11-13 NOTE — Addendum Note (Signed)
Addended by: Sharlot Gowda on: 11/13/2022 03:33 PM   Modules accepted: Orders

## 2022-11-28 ENCOUNTER — Other Ambulatory Visit: Payer: Self-pay

## 2022-11-28 DIAGNOSIS — Z01818 Encounter for other preprocedural examination: Secondary | ICD-10-CM

## 2022-12-18 ENCOUNTER — Emergency Department: Payer: Medicare Other

## 2022-12-18 ENCOUNTER — Inpatient Hospital Stay
Admission: EM | Admit: 2022-12-18 | Discharge: 2022-12-21 | DRG: 312 | Disposition: A | Discharge status: Home Health | Payer: Medicare Other | Attending: Internal Medicine | Admitting: Internal Medicine

## 2022-12-18 ENCOUNTER — Telehealth: Payer: Self-pay

## 2022-12-18 ENCOUNTER — Other Ambulatory Visit: Payer: Self-pay

## 2022-12-18 ENCOUNTER — Inpatient Hospital Stay: Payer: Medicare Other

## 2022-12-18 ENCOUNTER — Encounter: Payer: Self-pay | Admitting: Emergency Medicine

## 2022-12-18 ENCOUNTER — Inpatient Hospital Stay: Admission: RE | Admit: 2022-12-18 | Payer: Medicare Other | Source: Ambulatory Visit

## 2022-12-18 ENCOUNTER — Telehealth: Payer: Self-pay | Admitting: Neurosurgery

## 2022-12-18 DIAGNOSIS — Z79899 Other long term (current) drug therapy: Secondary | ICD-10-CM | POA: Diagnosis not present

## 2022-12-18 DIAGNOSIS — G4733 Obstructive sleep apnea (adult) (pediatric): Secondary | ICD-10-CM | POA: Diagnosis present

## 2022-12-18 DIAGNOSIS — I130 Hypertensive heart and chronic kidney disease with heart failure and stage 1 through stage 4 chronic kidney disease, or unspecified chronic kidney disease: Secondary | ICD-10-CM | POA: Diagnosis present

## 2022-12-18 DIAGNOSIS — Z91048 Other nonmedicinal substance allergy status: Secondary | ICD-10-CM

## 2022-12-18 DIAGNOSIS — F419 Anxiety disorder, unspecified: Secondary | ICD-10-CM | POA: Diagnosis present

## 2022-12-18 DIAGNOSIS — Z794 Long term (current) use of insulin: Secondary | ICD-10-CM

## 2022-12-18 DIAGNOSIS — K219 Gastro-esophageal reflux disease without esophagitis: Secondary | ICD-10-CM | POA: Diagnosis present

## 2022-12-18 DIAGNOSIS — Z9842 Cataract extraction status, left eye: Secondary | ICD-10-CM | POA: Diagnosis not present

## 2022-12-18 DIAGNOSIS — Z6832 Body mass index (BMI) 32.0-32.9, adult: Secondary | ICD-10-CM

## 2022-12-18 DIAGNOSIS — I5032 Chronic diastolic (congestive) heart failure: Secondary | ICD-10-CM | POA: Diagnosis present

## 2022-12-18 DIAGNOSIS — I2510 Atherosclerotic heart disease of native coronary artery without angina pectoris: Secondary | ICD-10-CM | POA: Diagnosis present

## 2022-12-18 DIAGNOSIS — E1122 Type 2 diabetes mellitus with diabetic chronic kidney disease: Secondary | ICD-10-CM | POA: Diagnosis present

## 2022-12-18 DIAGNOSIS — I272 Pulmonary hypertension, unspecified: Secondary | ICD-10-CM | POA: Diagnosis present

## 2022-12-18 DIAGNOSIS — Z9841 Cataract extraction status, right eye: Secondary | ICD-10-CM | POA: Diagnosis not present

## 2022-12-18 DIAGNOSIS — I951 Orthostatic hypotension: Secondary | ICD-10-CM | POA: Diagnosis present

## 2022-12-18 DIAGNOSIS — E119 Type 2 diabetes mellitus without complications: Secondary | ICD-10-CM | POA: Diagnosis not present

## 2022-12-18 DIAGNOSIS — I10 Essential (primary) hypertension: Secondary | ICD-10-CM | POA: Diagnosis present

## 2022-12-18 DIAGNOSIS — M549 Dorsalgia, unspecified: Secondary | ICD-10-CM | POA: Insufficient documentation

## 2022-12-18 DIAGNOSIS — Z961 Presence of intraocular lens: Secondary | ICD-10-CM | POA: Diagnosis present

## 2022-12-18 DIAGNOSIS — Z880 Allergy status to penicillin: Secondary | ICD-10-CM

## 2022-12-18 DIAGNOSIS — E11319 Type 2 diabetes mellitus with unspecified diabetic retinopathy without macular edema: Secondary | ICD-10-CM | POA: Diagnosis present

## 2022-12-18 DIAGNOSIS — N1832 Chronic kidney disease, stage 3b: Secondary | ICD-10-CM | POA: Diagnosis present

## 2022-12-18 DIAGNOSIS — Z7984 Long term (current) use of oral hypoglycemic drugs: Secondary | ICD-10-CM

## 2022-12-18 DIAGNOSIS — F32A Depression, unspecified: Secondary | ICD-10-CM | POA: Diagnosis present

## 2022-12-18 DIAGNOSIS — Z888 Allergy status to other drugs, medicaments and biological substances status: Secondary | ICD-10-CM

## 2022-12-18 DIAGNOSIS — M4854XA Collapsed vertebra, not elsewhere classified, thoracic region, initial encounter for fracture: Secondary | ICD-10-CM | POA: Diagnosis present

## 2022-12-18 DIAGNOSIS — E876 Hypokalemia: Secondary | ICD-10-CM | POA: Diagnosis present

## 2022-12-18 DIAGNOSIS — R55 Syncope and collapse: Secondary | ICD-10-CM | POA: Diagnosis present

## 2022-12-18 DIAGNOSIS — Z885 Allergy status to narcotic agent status: Secondary | ICD-10-CM

## 2022-12-18 DIAGNOSIS — E7800 Pure hypercholesterolemia, unspecified: Secondary | ICD-10-CM | POA: Diagnosis present

## 2022-12-18 DIAGNOSIS — Z833 Family history of diabetes mellitus: Secondary | ICD-10-CM | POA: Diagnosis not present

## 2022-12-18 DIAGNOSIS — I700 Atherosclerosis of aorta: Secondary | ICD-10-CM | POA: Diagnosis present

## 2022-12-18 DIAGNOSIS — E669 Obesity, unspecified: Secondary | ICD-10-CM | POA: Diagnosis present

## 2022-12-18 DIAGNOSIS — Z7985 Long-term (current) use of injectable non-insulin antidiabetic drugs: Secondary | ICD-10-CM

## 2022-12-18 DIAGNOSIS — Z981 Arthrodesis status: Secondary | ICD-10-CM

## 2022-12-18 LAB — BASIC METABOLIC PANEL
Anion gap: 11 (ref 5–15)
BUN: 15 mg/dL (ref 8–23)
CO2: 23 mmol/L (ref 22–32)
Calcium: 9.1 mg/dL (ref 8.9–10.3)
Chloride: 103 mmol/L (ref 98–111)
Creatinine, Ser: 1.23 mg/dL — ABNORMAL HIGH (ref 0.44–1.00)
GFR, Estimated: 46 mL/min — ABNORMAL LOW (ref >60)
Glucose, Bld: 162 mg/dL — ABNORMAL HIGH (ref 70–99)
Potassium: 3.1 mmol/L — ABNORMAL LOW (ref 3.5–5.1)
Sodium: 137 mmol/L (ref 135–145)

## 2022-12-18 LAB — URINALYSIS, ROUTINE W REFLEX MICROSCOPIC
Bilirubin Urine: NEGATIVE
Glucose, UA: >=500 mg/dL — AB
Hgb urine dipstick: NEGATIVE
Ketones, ur: NEGATIVE mg/dL
Nitrite: NEGATIVE
Protein, ur: 30 mg/dL — AB
Specific Gravity, Urine: 1.023 (ref 1.005–1.030)
pH: 6 (ref 5.0–8.0)

## 2022-12-18 LAB — CBC
HCT: 45.4 % (ref 36.0–46.0)
Hemoglobin: 14.5 g/dL (ref 12.0–15.0)
MCH: 30 pg (ref 26.0–34.0)
MCHC: 31.9 g/dL (ref 30.0–36.0)
MCV: 93.8 fL (ref 80.0–100.0)
Platelets: 280 10*3/uL (ref 150–400)
RBC: 4.84 MIL/uL (ref 3.87–5.11)
RDW: 14.2 % (ref 11.5–15.5)
WBC: 7.6 10*3/uL (ref 4.0–10.5)
nRBC: 0 % (ref 0.0–0.2)

## 2022-12-18 LAB — TROPONIN I (HIGH SENSITIVITY): Troponin I (High Sensitivity): 9 ng/L (ref <18)

## 2022-12-18 LAB — MAGNESIUM: Magnesium: 2.1 mg/dL (ref 1.7–2.4)

## 2022-12-18 LAB — CBG MONITORING, ED
Glucose-Capillary: 94 mg/dL (ref 70–99)
Glucose-Capillary: 100 mg/dL — ABNORMAL HIGH (ref 70–99)

## 2022-12-18 MED ORDER — LIDOCAINE 5 % EX PTCH
2.0000 | MEDICATED_PATCH | CUTANEOUS | Status: DC
  Administered 2022-12-18 – 2022-12-20 (×3): 2 via TRANSDERMAL
  Filled 2022-12-18 (×5): qty 2

## 2022-12-18 MED ORDER — MORPHINE SULFATE (PF) 2 MG/ML IV SOLN: 2.0000 mg | INTRAVENOUS | Status: DC | PRN

## 2022-12-18 MED ORDER — NITROGLYCERIN 0.4 MG SL SUBL: 0.4000 mg | SUBLINGUAL_TABLET | SUBLINGUAL | Status: DC | PRN

## 2022-12-18 MED ORDER — SENNOSIDES-DOCUSATE SODIUM 8.6-50 MG PO TABS: 1.0000 | ORAL_TABLET | Freq: Every evening | ORAL | Status: DC | PRN

## 2022-12-18 MED ORDER — ENOXAPARIN SODIUM 60 MG/0.6ML IJ SOSY
45.0000 mg | PREFILLED_SYRINGE | INTRAMUSCULAR | Status: DC
  Administered 2022-12-18 – 2022-12-20 (×3): 45 mg via SUBCUTANEOUS
  Filled 2022-12-18 (×3): qty 0.6

## 2022-12-18 MED ORDER — VITAMIN B-12 1000 MCG PO TABS
1000.0000 ug | ORAL_TABLET | Freq: Every day | ORAL | Status: DC
  Administered 2022-12-19 – 2022-12-21 (×3): 1000 ug via ORAL
  Filled 2022-12-18 (×3): qty 1

## 2022-12-18 MED ORDER — LORAZEPAM 2 MG/ML IJ SOLN
0.5000 mg | Freq: Once | INTRAMUSCULAR | Status: AC | PRN
  Administered 2022-12-18: 0.5 mg via INTRAVENOUS
  Filled 2022-12-18: qty 1

## 2022-12-18 MED ORDER — ROSUVASTATIN CALCIUM 10 MG PO TABS
40.0000 mg | ORAL_TABLET | Freq: Every day | ORAL | Status: DC
  Administered 2022-12-19 – 2022-12-20 (×2): 40 mg via ORAL
  Filled 2022-12-18 (×2): qty 4

## 2022-12-18 MED ORDER — VITAMIN C 500 MG PO TABS
250.0000 mg | ORAL_TABLET | Freq: Every day | ORAL | Status: DC
  Administered 2022-12-19 – 2022-12-21 (×3): 250 mg via ORAL
  Filled 2022-12-18 (×3): qty 1

## 2022-12-18 MED ORDER — ACETAMINOPHEN 325 MG PO TABS
650.0000 mg | ORAL_TABLET | Freq: Once | ORAL | Status: AC
  Administered 2022-12-18: 650 mg via ORAL
  Filled 2022-12-18: qty 2

## 2022-12-18 MED ORDER — POTASSIUM CHLORIDE 20 MEQ PO PACK
40.0000 meq | PACK | Freq: Two times a day (BID) | ORAL | Status: DC
  Administered 2022-12-18: 40 meq via ORAL
  Filled 2022-12-18: qty 2

## 2022-12-18 MED ORDER — SODIUM CHLORIDE 0.9% FLUSH
3.0000 mL | Freq: Two times a day (BID) | INTRAVENOUS | Status: DC
  Administered 2022-12-18 – 2022-12-21 (×6): 3 mL via INTRAVENOUS

## 2022-12-18 MED ORDER — INSULIN ASPART 100 UNIT/ML IJ SOLN
0.0000 [IU] | Freq: Every day | INTRAMUSCULAR | Status: DC
  Administered 2022-12-18: 0 [IU] via SUBCUTANEOUS

## 2022-12-18 MED ORDER — HYDRALAZINE HCL 20 MG/ML IJ SOLN: 5.0000 mg | Freq: Four times a day (QID) | INTRAMUSCULAR | Status: DC | PRN

## 2022-12-18 MED ORDER — DULOXETINE HCL 30 MG PO CPEP
60.0000 mg | ORAL_CAPSULE | ORAL | Status: DC
  Administered 2022-12-19 – 2022-12-21 (×3): 60 mg via ORAL
  Filled 2022-12-18 (×3): qty 2

## 2022-12-18 MED ORDER — SODIUM CHLORIDE 0.9 % IV BOLUS
1000.0000 mL | Freq: Once | INTRAVENOUS | Status: AC
  Administered 2022-12-18: 1000 mL via INTRAVENOUS

## 2022-12-18 MED ORDER — ONDANSETRON HCL 4 MG PO TABS: 4.0000 mg | ORAL_TABLET | Freq: Four times a day (QID) | ORAL | Status: DC | PRN

## 2022-12-18 MED ORDER — ENOXAPARIN SODIUM 40 MG/0.4ML IJ SOSY: 40.0000 mg | PREFILLED_SYRINGE | INTRAMUSCULAR | Status: DC

## 2022-12-18 MED ORDER — ONDANSETRON HCL 4 MG/2ML IJ SOLN: 4.0000 mg | Freq: Four times a day (QID) | INTRAMUSCULAR | Status: DC | PRN

## 2022-12-18 MED ORDER — VITAMIN D 25 MCG (1000 UNIT) PO TABS
2000.0000 [IU] | ORAL_TABLET | Freq: Every day | ORAL | Status: DC
  Administered 2022-12-19 – 2022-12-21 (×3): 2000 [IU] via ORAL
  Filled 2022-12-18 (×3): qty 2

## 2022-12-18 MED ORDER — INSULIN ASPART 100 UNIT/ML IJ SOLN
0.0000 [IU] | Freq: Three times a day (TID) | INTRAMUSCULAR | Status: DC
  Administered 2022-12-19: 1 [IU] via SUBCUTANEOUS
  Administered 2022-12-20 (×2): 2 [IU] via SUBCUTANEOUS
  Administered 2022-12-21: 1 [IU] via SUBCUTANEOUS
  Filled 2022-12-18 (×4): qty 1

## 2022-12-18 MED ORDER — ACETAMINOPHEN 650 MG RE SUPP: 650.0000 mg | Freq: Four times a day (QID) | RECTAL | Status: DC | PRN

## 2022-12-18 MED ORDER — POTASSIUM CHLORIDE 20 MEQ PO PACK
40.0000 meq | PACK | Freq: Two times a day (BID) | ORAL | Status: AC
  Administered 2022-12-18: 40 meq via ORAL
  Filled 2022-12-18: qty 2

## 2022-12-18 MED ORDER — ACETAMINOPHEN 325 MG PO TABS
650.0000 mg | ORAL_TABLET | Freq: Four times a day (QID) | ORAL | Status: DC | PRN
  Administered 2022-12-19 – 2022-12-20 (×4): 650 mg via ORAL
  Filled 2022-12-18 (×4): qty 2

## 2022-12-18 NOTE — ED Notes (Signed)
Pt moved to cpod room 31.  Report off to cpod nurse matt rn .

## 2022-12-18 NOTE — Assessment & Plan Note (Addendum)
With recent increase of weight loss of approximately 53 pounds over the last year This complicates overall care and prognosis.  On extensive chart review, it appears patient was prescribed Ozempic in August 2023, she was unable to take for a long time due to the medication being too expensive.  Telemedicine visit on 02/01/23: Patient was then prescribed Ozempic again in January 31, 2022- September 05, 2022.

## 2022-12-18 NOTE — H&P (Addendum)
History and Physical   Monica Ray NGE:[id number] DOB: [birth year] DOA: 12/18/2022  PCP: Patrice Paradise, MD  Patient coming from: Home  I have personally briefly reviewed patient's old medical records in [facility] EMR.  Chief Concern: Syncope  HPI: Monica Ray is a 77 year old female with history of insulin-dependent diabetes mellitus type 2, hypertension, depression, anxiety, hyperlipidemia, who presents to the emergency department for chief concerns of syncopal events.  Vitals in the ED showed temperature 98.2, respiration rate 16, heart rate 64, blood pressure 152/64, and upon standing it was 99/50, SpO2 100% on room air.  Serum sodium is 137, potassium 3.1, chloride 103, bicarb 23, BUN of 15, serum creatinine 1.23, EGFR 46, nonfasting blood glucose 162, WBC 7.6, hemoglobin 14.5, platelets of 280.  High sensitive troponin was 9.  ED treatment: Potassium chloride 40 mill equivalent p.o. one-time dose, acetaminophen 650 mg p.o. one-time dose. --------------------------- At bedside, patient is able to tell me her name, age, location, current calendar year.  She reports that she woke up at around 6 AM to go urinate.  After urinating, she went back to bed to close to 7 AM.  At 7 AM she got up went to the restroom to weigh herself on the scale in the next thing she knew, she felt like she was passing out, she fell back and hit her head against the wall.  She woke up on the floor.  Upon waking up on the floor, she got up and went to the kitchen to get yogurt, Activia.  After eating the activity yogurt, she then called EMS to come to the emergency department to be evaluated.  At bedside, she denies chest pain, shortness of breath, dysuria, hematuria, diarrhea, swelling of her lower extremities, fever, chills, cough, nausea, vomiting.  She reports that she has been having periods of dizziness.  Of note, she was seen by her nephrologist for blood work in March and when she got  on the scale, she felt dizzy.  On November 27, 2022, she was at home and had a syncopal event.  She refused to go to the emergency department at that time and her family monitored her throughout the day.  She reports that she thinks she started on Ozempic in March 2024, and since then she has lost approximately 53 pounds.  2 of her blood pressure medications were stopped over the last year due to dizziness outpatient.  She reports that her diet is not as balanced as it should, she frequently eats 2 eggs for breakfast and frozen TV dinners.  There are minimal fresh vegetables and fruit in her diet due to her back hurting her so much that she is not able to get into the kitchen to prepare fresh meals.  Social history: She lives at home with her husband. She denies tobacco, etoh, and recreational drug use. She currently works at the [employer].  ROS: Constitutional: + weight change (lost), no fever ENT/Mouth: no sore throat, no rhinorrhea Eyes: no eye pain, no vision changes Cardiovascular: no chest pain, no dyspnea,  no edema, no palpitations Respiratory: no cough, no sputum, no wheezing Gastrointestinal: no nausea, no vomiting, no diarrhea, no constipation Genitourinary: no urinary incontinence, no dysuria, no hematuria Musculoskeletal: no arthralgias, no myalgias Skin: no skin lesions, no pruritus, Neuro: + weakness, no loss of consciousness, + syncope Psych: no anxiety, no depression, + decrease appetite Heme/Lymph: no bruising, no bleeding  ED Course: Discussed with EDP, patient requiring hospitalization for chief concerns of  syncope.  Assessment/Plan  Principal Problem:   Syncope Active Problems:   Type II diabetes mellitus (HCC)   HYPERCHOLESTEROLEMIA   Essential hypertension   Obesity, unspecified   Pulmonary hypertension (HCC)   Hypokalemia   Back pain   CKD stage 3b, GFR 30-44 ml/min (HCC)   Assessment and Plan:  * Syncope Multiple episodes of dizziness and syncope over  the last year etiology workup in progress Differentials include pneumonia, valvular disease, thyroid disorder, neoplastic growth in the brain, hypoglycemia complicated by aggressive weight loss with Ozempic use Check procalcitonin, complete echo, TSH, MRI of the brain without contrast ordered Last A1c was 6% in 09/03/2022, check A1c in a.m. Fall precaution, aspiration precaution Telemetry cardiac, inpatient  CKD stage 3b, GFR 30-44 ml/min (HCC) At baseline  Back pain At approximately the level of T11 New compression fracture-treat with TLSO brace per neurosurgery Lidocaine 2 patches every 24 hours administer over 12 hours ordered Morphine 2 mg IV every 4 hours as needed for severe pain, 20 hours of coverage ordered  Hypokalemia Serum magnesium level is 2.1 Status post potassium chloride 40 mill equivalent p.o. twice daily, 2 doses ordered Recheck BMP in a.m.  Obesity, unspecified With recent increase of weight loss of approximately 53 pounds over the last year This complicates overall care and prognosis.  On extensive chart review, it appears patient was prescribed Ozempic in August 2023, she was unable to take for a long time due to the medication being too expensive.  Telemedicine visit on 02/01/23: Patient was then prescribed Ozempic again in January 31, 2022- September 05, 2022.  Essential hypertension Home diltiazem 120 mg daily not resumed on admission as patient has hypotension and low-normotension Hydralazine 5 mg IV every 6 hours as needed for SBP greater 165, 5 days ordered  HYPERCHOLESTEROLEMIA Home rosuvastatin 40 mg nightly resumed  Type II diabetes mellitus (HCC) Insulin-dependent diabetes mellitus Insulin SSI with at bedtime coverage ordered  Chart reviewed.   DVT prophylaxis: Enoxaparin Code Status: Full code Diet: Heart healthy Family Communication: Discussed with daughter, Monica Ray at bedside with patient's permission Disposition Plan: Pending clinical  course Consults called: none at this time Admission status: Telemetry cardiac, inpatient  Past Medical History:  Diagnosis Date   Anemia    before hysterectomy   Anginal pain (HCC)    Aortic atherosclerosis (HCC)    Cervical myelopathy (HCC)    Cervical spinal stenosis    Chronic heart failure with preserved ejection fraction (HFpEF) (HCC)    a. 09/2021 Echo: EF 60-65%, no rwma, GrI DD, nl RV fxn, RVSP 47.69mmHg. Mildly dil LA; b. 10/2021 RHC: RA 15, RV 50/9, PA 48/21 (32), PCWP 21, LVEDP 27.   CKD (chronic kidney disease), stage III (HCC)    DDD (degenerative disc disease), lumbar    Depression    Diabetes mellitus, type 2 (HCC)    DM retinopathy (HCC)    Dyspnea on exertion    Edema    GERD (gastroesophageal reflux disease)    History of bilateral cataract extraction 2021   Hx of phlebitis    Hyperlipidemia    Hypertension    Lumbar stenosis    Mild allergic rhinitis    MRSA (methicillin resistant staph aureus) culture positive 03/2006   Nonobstructive CAD (coronary artery disease)    a. 12/2015 Cath: LAD min irregs, otw nl cors; b. 08/2018 Cor CTA: Mid/Distal LAD and OM1 dzs w/ abnl ctFFR; c. 09/2018 Cath: LM nl, LAD 40p, 69m, D1 50, LCX small-nl, RCA nl, EF  55-65%; c. 10/2021 Cath: LM nl, LAD 40p/73m, lat D1 60, LCX sm nl, OM1 nl, RCA large, nl, RPDA/PAV/PL1-3 nl.   OA (osteoarthritis)    hands   Obesity    OSA on CPAP    Pulmonary HTN (HCC)    a.) R/LHC 12/08/2015: mPA 25, mPCWP 15, AO sat 93.5, CO 6.69, CI 3.04; b.) TTE 09/21/2021: RVSP 47.8; c.) R/LHC 10/23/2021: mPA 32, mPCWP 21, PA sat 73, CO 11.5   Past Surgical History:  Procedure Laterality Date   ABDOMINAL HYSTERECTOMY     ADENOIDECTOMY     ANTERIOR CERVICAL DECOMP/DISCECTOMY FUSION N/A 05/16/2022   Procedure: C4-6 ANTERIOR CERVICAL DISCECTOMY AND FUSION;  Surgeon: Venetia Night, MD;  Location: [facility] ORS;  Service: Neurosurgery;  Laterality: N/A;   APPENDECTOMY  1989   CARDIAC CATHETERIZATION Bilateral  12/08/2015   Procedure: Right/Left Heart Cath and Coronary Angiography;  Surgeon: Iran Ouch, MD;  Location: [facility] INVASIVE CV LAB;  Service: Cardiovascular;  Laterality: Bilateral;   CATARACT EXTRACTION W/PHACO Left 05/12/2019   Procedure: CATARACT EXTRACTION PHACO AND INTRAOCULAR LENS PLACEMENT (IOC) LEFT VISION BLUE;  Surgeon: Elliot Cousin, MD;  Location: [facility];  Service: Ophthalmology;  Laterality: Left;  12.80 1:17.1   CATARACT EXTRACTION W/PHACO Right 06/11/2019   Procedure: CATARACT EXTRACTION PHACO AND INTRAOCULAR LENS PLACEMENT (IOC) RIGHT DIABETIC VISION BLUE 12.91  01:21.2;  Surgeon: Elliot Cousin, MD;  Location: [facility];  Service: Ophthalmology;  Laterality: Right;  Diabetic - insulin and oral meds   CHOLECYSTECTOMY  1989   LEFT HEART CATH AND CORONARY ANGIOGRAPHY Left 09/15/2018   Procedure: LEFT HEART CATH AND CORONARY ANGIOGRAPHY;  Surgeon: Iran Ouch, MD;  Location: [facility] INVASIVE CV LAB;  Service: Cardiovascular;  Laterality: Left;   LUMBAR LAMINECTOMY/DECOMPRESSION MICRODISCECTOMY N/A 11/25/2014   Procedure: LUMBAR LAMINECTOMY/DECOMPRESSION MICRODISCECTOMY LUMBAR TWO-THREE, LUMBAR THREE-FOUR ;  Surgeon: Tressie Stalker, MD;  Location: [facility];  Service: Neurosurgery;  Laterality: N/A;   RIGHT/LEFT HEART CATH AND CORONARY ANGIOGRAPHY Bilateral 10/23/2021   Procedure: RIGHT/LEFT HEART CATH AND CORONARY ANGIOGRAPHY;  Surgeon: Iran Ouch, MD;  Location: [facility] INVASIVE CV LAB;  Service: Cardiovascular;  Laterality: Bilateral;   ROTATOR CUFF REPAIR Right 08/2001   Social History:  reports that she has never smoked. She has never used smokeless tobacco. She reports that she does not drink alcohol and does not use drugs.  Allergies  Allergen Reactions   Tegaderm Ag Mesh [Silver] Rash   Amoxicillin Hives and Rash    Did it involve swelling of the face/tongue/throat, SOB, or low BP? No Did it involve sudden or severe rash/hives, skin  peeling, or any reaction on the inside of your mouth or nose? No Did you need to seek medical attention at a hospital or doctor's office? No When did it last happen?  ~2015  If all above answers are "NO", may proceed with cephalosporin use.    Metformin Diarrhea and Nausea And Vomiting   Doxycycline Nausea Only   Oxycodone Itching   Tape Other (See Comments)    Caused burning sensation, Tagaderm    Azithromycin Rash and Itching   Clindamycin Hives and Rash   Fluconazole Itching   Simvastatin Other (See Comments)    Back pain   Family History  Problem Relation Age of Onset   Cancer Mother 72       lung   Diabetes Mother    Alcohol abuse Mother    Alcohol abuse Father    Cancer Father  throat   Aneurysm Father    Diabetes Maternal Grandmother    Aneurysm Paternal Grandmother    Breast cancer Paternal Aunt        31's   Cancer Sister    Family history: Family history reviewed and not pertinent.  Prior to Admission medications   Medication Sig Start Date End Date Taking? Authorizing Provider  ascorbic acid (VITAMIN C) 500 MG tablet Take 250 mg by mouth daily. Gummy    [provider]  Cholecalciferol (VITAMIN D3) 50 MCG (2000 UT) TABS Take 2,000 Units by mouth daily.    [provider]  clotrimazole-betamethasone (LOTRISONE) cream Apply 1 application topically 2 (two) times daily as needed (rash).  12/21/15   [provider]  cyanocobalamin (VITAMIN B12) 1000 MCG tablet Take 1,000 mcg by mouth daily.    [provider]  diltiazem (CARDIZEM CD) 120 MG 24 hr capsule Take 1 capsule (120 mg total) by mouth daily. 09/06/22 08/21/32  Iran Ouch, MD  DULoxetine (CYMBALTA) 60 MG capsule Take 60 mg by mouth every morning.    [provider]  furosemide (LASIX) 40 MG tablet Take 0.5-1 tablets (20-40 mg total) by mouth as needed. 09/06/22   Iran Ouch, MD  insulin lispro (HUMALOG) 100 UNIT/ML injection Inject 8-24 Units into the  skin 3 (three) times daily with meals. plus sliding scale (4 units per 50 over a target of 150) USES SS    [provider]  JARDIANCE 10 MG TABS tablet TAKE 1 TABLET(10 MG) BY MOUTH DAILY BEFORE BREAKFAST 10/15/22   Iran Ouch, MD  nitroGLYCERIN (NITROSTAT) 0.4 MG SL tablet Place 1 tablet (0.4 mg total) under the tongue every 5 (five) minutes as needed for chest pain. 10/31/21   Creig Hines, NP  OZEMPIC, 2 MG/DOSE, 8 MG/3ML SOPN Inject 2 mg as directed once a week. Mondays 01/31/22   [provider]  rosuvastatin (CRESTOR) 40 MG tablet TAKE 1 TABLET(40 MG) BY MOUTH DAILY 07/25/20   Dunn, Ryan M, PA-C  TOUJEO MAX SOLOSTAR 300 UNIT/ML Solostar Pen Inject 40 Units into the skin at bedtime. 01/31/22   [provider]   Physical Exam: Vitals:   12/18/22 1242 12/18/22 1244 12/18/22 1247 12/18/22 1553  BP: (S) (!) 146/63 (S) 139/66 (S) (!) 99/50 (!) 102/52  Pulse: 68 67 60 62  Resp:    20  Temp:    98.4 F (36.9 C)  TempSrc:    Oral  SpO2:    94%  Weight:      Height:       Constitutional: appears age-appropriate, NAD, calm Eyes: PERRL, lids and conjunctivae normal ENMT: Mucous membranes are moist. Posterior pharynx clear of any exudate or lesions. Age-appropriate dentition. Hearing appropriate Neck: normal, supple, no masses, no thyromegaly Respiratory: clear to auscultation bilaterally, no wheezing, no crackles. Normal respiratory effort. No accessory muscle use.  Cardiovascular: Regular rate and rhythm, rubs / gallops. + murmur. No extremity edema. 2+ pedal pulses. No carotid bruits.  Abdomen: no tenderness, no masses palpated, no hepatosplenomegaly. Bowel sounds positive.  Musculoskeletal: no clubbing / cyanosis. No joint deformity upper and lower extremities. Good ROM, no contractures, no atrophy. Normal muscle tone.  Skin: no rashes, lesions, ulcers. No induration Neurologic: Sensation intact. Strength 5/5 in all 4.  Psychiatric: Normal  judgment and insight. Alert and oriented x 3. Normal mood.   EKG: independently reviewed, showing sinus rhythm with rate 65, QTc 426  Chest x-ray on Admission: Ordered and  pending completion  CT Lumbar Spine Wo Contrast  Result Date: 12/18/2022 CLINICAL DATA:  Fall.  Low back pain. EXAM: CT LUMBAR SPINE WITHOUT CONTRAST TECHNIQUE: Multidetector CT imaging of the lumbar spine was performed without intravenous contrast administration. Multiplanar CT image reconstructions were also generated. RADIATION DOSE REDUCTION: This exam was performed according to the departmental dose-optimization program which includes automated exposure control, adjustment of the mA and/or kV according to patient size and/or use of iterative reconstruction technique. COMPARISON:  Lumbar spine MRI 03/19/2022. CT lumbar spine myelogram 10/28/2015. FINDINGS: Segmentation: Transitional lumbosacral anatomy. To maintain consistency with prior reporting, there is left hemi sacralization of the L5 vertebral segment and absent ribs at T12. Alignment: Unchanged trace retrolisthesis of L1 on L2. Vertebrae: Mild compression deformity of the T11 vertebral body with cortical disruption laterally (coronal image 37 series 7), suggesting acute compression fracture. Severe degenerative endplate changes at L1-2. Postoperative changes of prior L3-4 laminectomy and L3-S1 posterior spinal fusion. Paraspinal and other soft tissues: Atherosclerotic calcifications of the abdominal aorta and its branches. Disc levels: Unchanged multilevel lumbar spondylosis, worst at L1-2, where there is severe spinal canal stenosis and severe bilateral neural foraminal narrowing. IMPRESSION: 1. Mild compression deformity of the T11 vertebral body with cortical disruption laterally, suggesting acute compression fracture. 2. Unchanged multilevel lumbar spondylosis, worst at L1-2, where there is severe spinal canal stenosis and severe bilateral neural foraminal narrowing. 3.  Postoperative changes of prior L3-4 laminectomy and L3-S1 posterior spinal fusion. Aortic Atherosclerosis (ICD10-I70.0). Electronically Signed   By: Orvan Falconer M.D.   On: 12/18/2022 14:08   CT Cervical Spine Wo Contrast  Result Date: 12/18/2022 CLINICAL DATA:  Syncopal episode with fall. Trauma to the head and neck. EXAM: CT CERVICAL SPINE WITHOUT CONTRAST TECHNIQUE: Multidetector CT imaging of the cervical spine was performed without intravenous contrast. Multiplanar CT image reconstructions were also generated. RADIATION DOSE REDUCTION: This exam was performed according to the departmental dose-optimization program which includes automated exposure control, adjustment of the mA and/or kV according to patient size and/or use of iterative reconstruction technique. COMPARISON:  Radiography 11/08/2022.  MRI 03/19/2022. FINDINGS: Alignment: No malalignment. Skull base and vertebrae: No regional fracture. Chronic fusion from C4 through C7. No evidence of hardware complication. Soft tissues and spinal canal: No traumatic soft tissue finding. Disc levels: The foramen magnum is widely patent. There is ordinary mild osteoarthritis of the C1-2 articulation but no encroachment upon the neural structures. C2-3: Bilateral facet osteoarthritis.  No stenosis. C3-4: Bilateral facet osteoarthritis.  No stenosis. C4 through C7: Chronic fusion. Canal and foramina sufficiently patent. C7-T1: Chronic facet osteoarthritis.  No stenosis. Upper chest: Negative Other: None IMPRESSION: No acute or traumatic finding. Chronic fusion from C4 through C7. No evidence of hardware complication. Chronic facet osteoarthritis at C2-3, C3-4 and C7-T1. Electronically Signed   By: Paulina Fusi M.D.   On: 12/18/2022 13:40   CT Head Wo Contrast  Result Date: 12/18/2022 CLINICAL DATA:  Syncopal episode and fall. EXAM: CT HEAD WITHOUT CONTRAST TECHNIQUE: Contiguous axial images were obtained from the base of the skull through the vertex  without intravenous contrast. RADIATION DOSE REDUCTION: This exam was performed according to the departmental dose-optimization program which includes automated exposure control, adjustment of the mA and/or kV according to patient size and/or use of iterative reconstruction technique. COMPARISON:  None Available. FINDINGS: Brain: Age related volume loss without subjective lobar predominance. No evidence of old or acute focal infarction, mass lesion, hemorrhage, hydrocephalus or extra-axial collection. Vascular: There is atherosclerotic  calcification of the major vessels at the base of the brain. Skull: Chronic benign calvarial thickening. No focal lesion. No traumatic finding. Sinuses/Orbits: Clear/normal Other: None IMPRESSION: No acute or traumatic finding. Age related volume loss. Atherosclerotic calcification of the major vessels at the base of the brain. Electronically Signed   By: Paulina Fusi M.D.   On: 12/18/2022 13:38    Labs on Admission: I have personally reviewed following labs  CBC: Recent Labs  Lab 12/18/22 0933  WBC 7.6  HGB 14.5  HCT 45.4  MCV 93.8  PLT 280   Basic Metabolic Panel: Recent Labs  Lab 12/18/22 0933 12/18/22 0934  NA 137  --   K 3.1*  --   CL 103  --   CO2 23  --   GLUCOSE 162*  --   BUN 15  --   CREATININE 1.23*  --   CALCIUM 9.1  --   MG  --  2.1   GFR: Estimated Creatinine Clearance: 42.5 mL/min (A) (by C-G formula based on SCr of 1.23 mg/dL (H)).  This document was prepared using Dragon Voice Recognition software and may include unintentional dictation errors.  Dr. Sedalia Muta [facility]  If 7PM-7AM, please contact overnight-coverage provider If 7AM-7PM, please contact day attending provider [email or web address]  12/18/2022, 4:24 PM

## 2022-12-18 NOTE — ED Triage Notes (Signed)
Patient to ED via [facility] from home after a syncopal episode. Pt reports this his her second syncopal episode in the last 3 weeks. States she has been having dizzy spells. C/o  lower back- suppose to have surgery on Monday. Did hit head- does not take blood thinners. AOx4

## 2022-12-18 NOTE — ED Notes (Signed)
TLSO  BRACE  CALLED FOR  PER  DR  [name]  MD

## 2022-12-18 NOTE — Telephone Encounter (Signed)
Dr Myer Haff has notified Ms Cervin that surgery for 12/24/22 will be postponed. New tentative surgery date is 02/18/23. Post op appointments have been moved accordingly. She will need a new appointment with pre-admit testing and an updated copy of instructions from our office. I will contact her to further discuss once she is discharged from the hospital.

## 2022-12-18 NOTE — Assessment & Plan Note (Signed)
Serum magnesium level is 2.1 Status post potassium chloride 40 mill equivalent p.o. twice daily, 2 doses ordered Recheck BMP in a.m.

## 2022-12-18 NOTE — Progress Notes (Signed)
PHARMACIST - PHYSICIAN COMMUNICATION  CONCERNING:  Enoxaparin (Lovenox) for DVT Prophylaxis    RECOMMENDATION: Patient was prescribed enoxaprin 40mg  q24 hours for VTE prophylaxis.   Filed Weights   12/18/22 0931  Weight: 88.5 kg (195 lb)    Body mass index is 33.47 kg/m.  Estimated Creatinine Clearance: 42.5 mL/min (A) (by C-G formula based on SCr of 1.23 mg/dL (H)).   Based on [facility] policy patient is candidate for enoxaparin 0.5mg /kg TBW SQ every 24 hours based on BMI being >30.  DESCRIPTION: Pharmacy has adjusted enoxaparin dose per [facility] policy.  Patient is now receiving enoxaparin 0.5 mg/kg every 24 hours    Lowella Bandy, PharmD Clinical Pharmacist  12/18/2022 2:44 PM

## 2022-12-18 NOTE — Consult Note (Signed)
T11 compression fracture  Will manage with TLSO for now.  Please wear TLSO when OOB.  Surgery for 11/18 cancelled - will review with medicine and PCP regarding clearance for surgery given recent falls and syncope

## 2022-12-18 NOTE — Assessment & Plan Note (Addendum)
Multiple episodes of dizziness and syncope over the last year etiology workup in progress Differentials include pneumonia, valvular disease, thyroid disorder, neoplastic growth in the brain, hypoglycemia complicated by aggressive weight loss with Ozempic use Check procalcitonin, complete echo, TSH, MRI of the brain without contrast ordered Last A1c was 6% in 09/03/2022, check A1c in a.m. Fall precaution, aspiration precaution Telemetry cardiac, inpatient

## 2022-12-18 NOTE — Assessment & Plan Note (Signed)
Home rosuvastatin 40 mg nightly resumed 

## 2022-12-18 NOTE — Assessment & Plan Note (Addendum)
At approximately the level of T11 New compression fracture-treat with TLSO brace per neurosurgery Lidocaine 2 patches every 24 hours administer over 12 hours ordered Morphine 2 mg IV every 4 hours as needed for severe pain, 20 hours of coverage ordered

## 2022-12-18 NOTE — Assessment & Plan Note (Signed)
At baseline 

## 2022-12-18 NOTE — Telephone Encounter (Addendum)
Dr Myer Haff reviewed Monica Ray's message. I spoke with Monica Ray. I advised her to contact pt's PCP to schedule a follow up appointment, as many things can cause dizziness and syncope. She has already left a message for pre-admit testing regarding rescheduling the appointment.  She is aware that surgery may need to be postponed depending on what happens with the ER visit.  I have asked her to keep [place] updated.

## 2022-12-18 NOTE — ED Notes (Signed)
Dr cox in with pt and family  iv fluids infusing.

## 2022-12-18 NOTE — Hospital Course (Addendum)
Monica Ray is a 77 year old female with history of insulin-dependent diabetes mellitus type 2, hypertension, depression, anxiety, hyperlipidemia, who presents to the emergency department for chief concerns of syncopal events.  This is the third syncope episode since May 2024.  At each time, patient was sitting on the weigh scale, looking down.  The last episode was witnessed without seizure activity.  Patient does not have confusion after the event.  Upon arriving the hospital, patient had a profound orthostatic hypotension.  Patient was placed on of hydrocortisone on 11/13, giving fluids on 11/14. Orthostatic hypotension much improved today, medically stable for discharge.

## 2022-12-18 NOTE — Telephone Encounter (Signed)
Patient's sister, Amil Amen is calling to let our office know that the patient fell this morning and hit her head. She was taken by EMS and is in the waiting room at the ER. She states that she left pre-op a message this morning that her sister would not be making it to the appointment for her upcoming surgery on 12/24/2022. She is concerned that this is the second time her sister has fallen in the last three weeks and hit her head. Please advise.

## 2022-12-18 NOTE — Assessment & Plan Note (Signed)
Insulin-dependent diabetes mellitus Insulin SSI with at bedtime coverage ordered

## 2022-12-18 NOTE — ED Provider Notes (Signed)
[facility] Provider Note    Event Date/Time   First MD Initiated Contact with Patient 12/18/22 1057     (approximate)   History   Loss of Consciousness   HPI {Remember to add pertinent medical, surgical, social, and/or OB history to HPI:1} Monica Ray is a 77 y.o. female  ***       Physical Exam   Triage Vital Signs: ED Triage Vitals  Encounter Vitals Group     BP 12/18/22 0931 (!) 158/72     Systolic BP Percentile --      Diastolic BP Percentile --      Pulse Rate 12/18/22 0931 64     Resp 12/18/22 0931 19     Temp 12/18/22 0931 98.2 F (36.8 C)     Temp Source 12/18/22 0931 Oral     SpO2 12/18/22 0931 100 %     Weight 12/18/22 0931 195 lb (88.5 kg)     Height 12/18/22 0931 5\' 4"  (1.626 m)     Head Circumference --      Peak Flow --      Pain Score 12/18/22 0931 10     Pain Loc --      Pain Education --      Exclude from Growth Chart --     Most recent vital signs: Vitals:   12/18/22 0931  BP: (!) 158/72  Pulse: 64  Resp: 19  Temp: 98.2 F (36.8 C)  SpO2: 100%    {Only need to document appropriate and relevant physical exam:1} General: Awake, no distress. *** CV:  Good peripheral perfusion. *** Resp:  Normal effort. *** Abd:  No distention. *** Other:  ***   ED Results / Procedures / Treatments   Labs (all labs ordered are listed, but only abnormal results are displayed) Labs Reviewed  BASIC METABOLIC PANEL - Abnormal; Notable for the following components:      Result Value   Potassium 3.1 (*)    Glucose, Bld 162 (*)    Creatinine, Ser 1.23 (*)    GFR, Estimated 46 (*)    All other components within normal limits  CBC  URINALYSIS, ROUTINE W REFLEX MICROSCOPIC  CBG MONITORING, ED  TROPONIN I (HIGH SENSITIVITY)     EKG  I, Phineas Semen, attending physician, personally viewed and interpreted this EKG  EKG Time: 0933 Rate: 65 Rhythm: normal sinus rhythm Axis: left axis deviation Intervals: qtc  426 QRS: narrow, q waves v1 ST changes: no st elevation Impression: abnormal ekg   RADIOLOGY *** {USE THE WORD "INTERPRETED"!! You MUST document your own interpretation of imaging, as well as the fact that you reviewed the radiologist's report!:1}   PROCEDURES:  Critical Care performed: Yes  CRITICAL CARE Performed by: Phineas Semen   Total critical care time: *** minutes  Critical care time was exclusive of separately billable procedures and treating other patients.  Critical care was necessary to treat or prevent imminent or life-threatening deterioration.  Critical care was time spent personally by me on the following activities: development of treatment plan with patient and/or surrogate as well as nursing, discussions with consultants, evaluation of patient's response to treatment, examination of patient, obtaining history from patient or surrogate, ordering and performing treatments and interventions, ordering and review of laboratory studies, ordering and review of radiographic studies, pulse oximetry and re-evaluation of patient's condition.   Procedures    MEDICATIONS ORDERED IN ED: Medications - No data to display   IMPRESSION /  MDM / ASSESSMENT AND PLAN / ED COURSE  I reviewed the triage vital signs and the nursing notes.                              Differential diagnosis includes, but is not limited to, ***  Patient's presentation is most consistent with {EM COPA:27473}   ***The patient is on the cardiac monitor to evaluate for evidence of arrhythmia and/or significant heart rate changes.  ***      FINAL CLINICAL IMPRESSION(S) / ED DIAGNOSES   Final diagnoses:  None     Rx / DC Orders   ED Discharge Orders     None        Note:  This document was prepared using Dragon voice recognition software and may include unintentional dictation errors.

## 2022-12-18 NOTE — Assessment & Plan Note (Signed)
Home diltiazem 120 mg daily not resumed on admission as patient has hypotension and low-normotension Hydralazine 5 mg IV every 6 hours as needed for SBP greater 165, 5 days ordered

## 2022-12-19 ENCOUNTER — Encounter: Payer: Self-pay | Admitting: Internal Medicine

## 2022-12-19 ENCOUNTER — Inpatient Hospital Stay (HOSPITAL_COMMUNITY)
Admit: 2022-12-19 | Discharge: 2022-12-19 | Disposition: A | Discharge status: Home / Self Care | Payer: Medicare Other | Attending: Internal Medicine | Admitting: Internal Medicine

## 2022-12-19 ENCOUNTER — Other Ambulatory Visit: Payer: Medicare Other

## 2022-12-19 DIAGNOSIS — R55 Syncope and collapse: Secondary | ICD-10-CM | POA: Diagnosis not present

## 2022-12-19 DIAGNOSIS — N1832 Chronic kidney disease, stage 3b: Secondary | ICD-10-CM

## 2022-12-19 DIAGNOSIS — Z794 Long term (current) use of insulin: Secondary | ICD-10-CM

## 2022-12-19 DIAGNOSIS — E119 Type 2 diabetes mellitus without complications: Secondary | ICD-10-CM | POA: Diagnosis not present

## 2022-12-19 LAB — ECHOCARDIOGRAM COMPLETE
AR max vel: 2.01 cm2
AV Area VTI: 2.29 cm2
AV Area mean vel: 2.27 cm2
AV Mean grad: 5 mm[Hg]
AV Peak grad: 10.9 mm[Hg]
Ao pk vel: 1.65 m/s
Area-P 1/2: 2.54 cm2
Calc EF: 67.1 %
Height: 64 in
MV VTI: 1.82 cm2
S' Lateral: 2.75 cm
Single Plane A2C EF: 73.2 %
Single Plane A4C EF: 63.3 %
Weight: 3030.4 [oz_av]

## 2022-12-19 LAB — PROCALCITONIN: Procalcitonin: <0.1 ng/mL

## 2022-12-19 LAB — CBC
HCT: 44.5 % (ref 36.0–46.0)
Hemoglobin: 14.3 g/dL (ref 12.0–15.0)
MCH: 29.7 pg (ref 26.0–34.0)
MCHC: 32.1 g/dL (ref 30.0–36.0)
MCV: 92.3 fL (ref 80.0–100.0)
Platelets: 263 10*3/uL (ref 150–400)
RBC: 4.82 MIL/uL (ref 3.87–5.11)
RDW: 14.4 % (ref 11.5–15.5)
WBC: 7.4 10*3/uL (ref 4.0–10.5)
nRBC: 0 % (ref 0.0–0.2)

## 2022-12-19 LAB — BASIC METABOLIC PANEL
Anion gap: 9 (ref 5–15)
BUN: 15 mg/dL (ref 8–23)
CO2: 22 mmol/L (ref 22–32)
Calcium: 8.7 mg/dL — ABNORMAL LOW (ref 8.9–10.3)
Chloride: 109 mmol/L (ref 98–111)
Creatinine, Ser: 1.25 mg/dL — ABNORMAL HIGH (ref 0.44–1.00)
GFR, Estimated: 45 mL/min — ABNORMAL LOW (ref >60)
Glucose, Bld: 109 mg/dL — ABNORMAL HIGH (ref 70–99)
Potassium: 3.7 mmol/L (ref 3.5–5.1)
Sodium: 140 mmol/L (ref 135–145)

## 2022-12-19 LAB — HEMOGLOBIN A1C
Hgb A1c MFr Bld: 6.8 % — ABNORMAL HIGH (ref 4.8–5.6)
Mean Plasma Glucose: 148.46 mg/dL

## 2022-12-19 LAB — TSH: TSH: 4.211 u[IU]/mL (ref 0.350–4.500)

## 2022-12-19 LAB — GLUCOSE, CAPILLARY
Glucose-Capillary: 120 mg/dL — ABNORMAL HIGH (ref 70–99)
Glucose-Capillary: 102 mg/dL — ABNORMAL HIGH (ref 70–99)
Glucose-Capillary: 132 mg/dL — ABNORMAL HIGH (ref 70–99)
Glucose-Capillary: 140 mg/dL — ABNORMAL HIGH (ref 70–99)

## 2022-12-19 MED ORDER — DIPHENHYDRAMINE HCL 25 MG PO CAPS
25.0000 mg | ORAL_CAPSULE | Freq: Four times a day (QID) | ORAL | Status: DC | PRN
  Administered 2022-12-19: 25 mg via ORAL
  Filled 2022-12-19: qty 1

## 2022-12-19 MED ORDER — ENSURE ENLIVE PO LIQD: 237.0000 mL | Freq: Two times a day (BID) | ORAL | Status: DC

## 2022-12-19 MED ORDER — FLUDROCORTISONE ACETATE 0.1 MG PO TABS
0.1000 mg | ORAL_TABLET | Freq: Every day | ORAL | Status: DC
  Administered 2022-12-19 – 2022-12-21 (×3): 0.1 mg via ORAL
  Filled 2022-12-19 (×3): qty 1

## 2022-12-19 NOTE — Progress Notes (Signed)
  Progress Note   Patient: Monica Ray LKG:[id number] DOB: 1945-06-14 DOA: 12/18/2022     1 DOS: the patient was seen and examined on 12/19/2022   Brief hospital course: [name] is a 77 year old female with history of insulin-dependent diabetes mellitus type 2, hypertension, depression, anxiety, hyperlipidemia, who presents to the emergency department for chief concerns of syncopal events.  This is the third syncope episode since May 2024.  At each time, patient was sitting on the weigh scale, looking down.  While the episode was witnessed without seizure activity.  Patient does not have confusion after the event.  Upon arriving the hospital, patient had a profound orthostatic hypotension.   Principal Problem:   Syncope Active Problems:   Type II diabetes mellitus (HCC)   HYPERCHOLESTEROLEMIA   Essential hypertension   Obesity, unspecified   Pulmonary hypertension (HCC)   Hypokalemia   Back pain   CKD stage 3b, GFR 30-44 ml/min (HCC)   Assessment and Plan:  * Syncope Orthostatic hypotension. Based on history, patient has recurrent syncope.  I reviewed the patient telemetry strips, no arrhythmia.  No significant hypoglycemia since admission.  Prior echocardiogram showed a moderate pulm hypertension, ejection fraction normal. Patient had a severe orthostatic hypotension which appear to be the cause of syncope.  We have discontinued all blood pressure medicines at this point.  Also added Florinef.  Continue to follow orthostatic vital signs.  PT/OT eval and treat.   CKD stage 3b, GFR 30-44 ml/min (HCC) Hypokalemia. Renal function at baseline, potassium normalized.  Back pain T11 compression fracture. Patient was initially scheduled for surgery by neurosurgery, which will be delayed due to syncope.  Obesity, BMI of 32.51. Exercise advised.  Essential hypertension Blood pressure medicines on hold.  HYPERCHOLESTEROLEMIA Home rosuvastatin 40 mg nightly  resumed  Type II diabetes mellitus (HCC) Patient was only on short acting insulin at home, will continue sliding scale insulin for now.      Subjective: Patient still feels dizzy when standing.  No short of breath or chest pain.  Physical Exam: Vitals:   12/19/22 0800 12/19/22 0830 12/19/22 0900 12/19/22 0934  BP: 135/61 (!) 81/45 111/60 (!) 149/68  Pulse: 69 70    Resp: (!) 23 17    Temp:      TempSrc:      SpO2: 98% 97%    Weight:      Height:       General exam: Appears calm and comfortable  Respiratory system: Clear to auscultation. Respiratory effort normal. Cardiovascular system: S1 & S2 heard, RRR. No JVD, murmurs, rubs, gallops or clicks. No pedal edema. Gastrointestinal system: Abdomen is nondistended, soft and nontender. No organomegaly or masses felt. Normal bowel sounds heard. Central nervous system: Alert and oriented. No focal neurological deficits. Extremities: Symmetric 5 x 5 power. Skin: No rashes, lesions or ulcers Psychiatry: Judgement and insight appear normal. Mood & affect appropriate.    Data Reviewed:  Lab results, prior echocardiogram.  MRI of the brain.  Family Communication: Monica Ray updated over the phone.  Disposition: Status is: Inpatient Remains inpatient appropriate because: Severity of disease,     Time spent: 50 minutes  Author: Marrion Coy, MD 12/19/2022 11:05 AM  For on call review [email or web address].

## 2022-12-19 NOTE — Progress Notes (Signed)
Transition of Care [facility]) - Inpatient Brief Assessment   Patient Details  Name: Monica Ray MRN: [id number] Date of Birth: [birth year]  Transition of Care [facility]) CM/SW Contact:    Truddie Hidden, RN Phone Number: 12/19/2022, 11:03 AM   Clinical Narrative: TOC continuing to follow patient's progress throughout discharge planning.   Transition of Care Asessment: Insurance and Status: Insurance coverage has been reviewed Patient has primary care physician: Yes Home environment has been reviewed: home Prior level of function:: independent Prior/Current Home Services: No current home services Social Determinants of Health Reivew: SDOH reviewed no interventions necessary Readmission risk has been reviewed: Yes Transition of care needs: transition of care needs identified, TOC will continue to follow

## 2022-12-19 NOTE — Progress Notes (Signed)
OT Cancellation Note  Patient Details Name: ROENA POSTLEWAITE MRN: [id number] DOB: 1945/12/01   Cancelled Treatment:    Reason Eval/Treat Not Completed: Other (comment) (OT eval- imminent discharge orders received, pt in with MD on attempt. OT will follow up as able.Oleta Mouse, OTD OTR/L  12/19/22, 10:48 AM

## 2022-12-19 NOTE — Plan of Care (Signed)
  Problem: Education: Goal: Ability to describe self-care measures that may prevent or decrease complications (Diabetes Survival Skills Education) will improve Outcome: Progressing Goal: Individualized Educational Video(s) Outcome: Progressing   Problem: Education: Goal: Ability to describe self-care measures that may prevent or decrease complications (Diabetes Survival Skills Education) will improve Outcome: Progressing   Problem: Education: Goal: Individualized Educational Video(s) Outcome: Progressing   Problem: Coping: Goal: Ability to adjust to condition or change in health will improve Outcome: Progressing

## 2022-12-19 NOTE — Evaluation (Signed)
Physical Therapy Evaluation Patient Details Name: Monica Ray MRN: [id number] DOB: 02/09/1945 Today's Date: 12/19/2022  History of Present Illness  Pt is a 77 yo female presenting to the ED with chief concerns of syncopal events. Also presenting with new T11 compression fracture to treat with TLSO brace per neurosurgery. PMH inlcudes insulin-dependent diabetes mellitus type 2, hypertension, depression, anxiety, hyperlipidemia, cervical myelopathy (ACDF in April 2024), aortic athersclerosis, chronic heart failure, CKD, and CAD.   Clinical Impression  Pt A,Ox4, reports 0/10 pain at rest with 10/10 mid back pain with all mobility (RN in room and administering pain meds), but receptive to attempting therapy today. Co-eval with OT per imminent d/c order and decreased pt tolerance to activity 2/2 orthostatics. Pt lives with husband who is unable to care for her and has been performing all mobility independently, but with chronic dizziness for over 6 months. Pt reports several falls and is independent with ADLs, except donning socks. During eval, orthostatic vitals assessed in supine (135/59), sitting EOB (125/62), and standing (100/48), pt requested to sit down 2/2 increased fatigue. Pt required mod A for bed mobility and cueing for proper log rolling technique, assistance to don TLSO brace, CGAx2 (for safety) for STS with RW, and minAx2 (for safety) for step pivot to/from [facility] without RW. Pt unable to take lateral steps to [facility]. 2/2 dizziness, pain, and fatigue. Pt left with all needs met and pain subsided. Pt would continue to benefit from skilled acute PT to progress toward mobility goals.      If plan is discharge home, recommend the following: A lot of help with bathing/dressing/bathroom;A lot of help with walking and/or transfers;Direct supervision/assist for financial management;Help with stairs or ramp for entrance;Assistance with cooking/housework;Assist for transportation   Can travel by private  vehicle   No    Equipment Recommendations Other (comment) (defer to next level of care)  Recommendations for Other Services       Functional Status Assessment Patient has had a recent decline in their functional status and demonstrates the ability to make significant improvements in function in a reasonable and predictable amount of time.     Precautions / Restrictions Precautions Precautions: Fall Required Braces or Orthoses: Other Brace Other Brace: TLSO- wear when OOB, may remove brace when showering Restrictions Weight Bearing Restrictions: No      Mobility  Bed Mobility Overal bed mobility: Needs Assistance Bed Mobility: Supine to Sit, Sit to Supine     Supine to sit: Mod assist, Used rails Sit to supine: Mod assist   General bed mobility comments: educated pt on log rolling for proper body mechanics; mod assist at trunk and BLE    Transfers Overall transfer level: Needs assistance Equipment used: Rolling walker (2 wheels) Transfers: Sit to/from Stand, Bed to chair/wheelchair/BSC Sit to Stand: Contact guard assist, +2 safety/equipment   Step pivot transfers: Min assist, +2 safety/equipment       General transfer comment: CGA to stand; minA and sequencing cues for step pivot to [facility] without AD; trialed lateral steps along HOB but limited 2/2 pain, dizziness, and fatigue    Ambulation/Gait                  Stairs            Wheelchair Mobility     Tilt Bed    Modified Rankin (Stroke Patients Only)       Balance Overall balance assessment: Needs assistance, History of Falls Sitting-balance support: No upper extremity supported, Feet  supported Sitting balance-Leahy Scale: Fair Sitting balance - Comments: limited 2/2 pain; pt reports constant dizziness   Standing balance support: During functional activity, Bilateral upper extremity supported, No upper extremity supported Standing balance-Leahy Scale: Poor Standing balance comment: CGA  with RW; minA without RW; very limited by dizziness and orthostatic hypotension                             Pertinent Vitals/Pain Pain Assessment Pain Assessment: 0-10 Pain Score: 10-Worst pain ever (0/10 at rest) Pain Location: mid back Pain Descriptors / Indicators: Aching, Discomfort Pain Intervention(s): Limited activity within patient's tolerance, Repositioned, Monitored during session, Premedicated before session (RN notified)    Home Living Family/patient expects to be discharged to:: Private residence Living Arrangements: Spouse/significant other Available Help at Discharge: Family Type of Home: House Home Access: Level entry (steep hill to go up driveway to enter home)       Home Layout: One level Home Equipment: Rollator (4 wheels);Grab bars - tub/shower;Shower seat;BSC/3in1;Other (comment) Additional Comments: Pt states her husband also uses a rollator at baseline    Prior Function Prior Level of Function : Needs assist;Working/employed;Driving;History of Falls (last six months)       Physical Assist : Mobility (physical);ADLs (physical) Mobility (physical):  (to get up after each fall)   Mobility Comments: Reports multiple falls due to chronic dizziness, 4-5 falls in the last month, but walks without AD ADLs Comments: Pt is (I) ADLexcpet for socks and IADL, drives, works full time at the [employer] in [place]. Her husband assists her in donning her socks and sometimes pants at baseline. She has multiple reachers. Has CPAP at home. Notes that her BIL hands begin to tingle later in the day; she is able to type at work without issue. States her hands and neck often feel "tired" at the end of the day.     Extremity/Trunk Assessment   Upper Extremity Assessment Upper Extremity Assessment: Generalized weakness    Lower Extremity Assessment Lower Extremity Assessment: Generalized weakness;Defer to PT evaluation    Cervical / Trunk Assessment Cervical / Trunk  Assessment: Normal;Neck Surgery  Communication   Communication Communication: No apparent difficulties Cueing Techniques: Verbal cues;Tactile cues  Cognition Arousal: Alert Behavior During Therapy: WFL for tasks assessed/performed Overall Cognitive Status: Within Functional Limits for tasks assessed                                          General Comments      Exercises     Assessment/Plan    PT Assessment Patient needs continued PT services  PT Problem List Decreased strength;Decreased mobility;Decreased safety awareness;Decreased range of motion;Decreased activity tolerance;Decreased balance;Decreased knowledge of use of DME;Pain       PT Treatment Interventions DME instruction;Therapeutic exercise;Gait training;Balance training;Functional mobility training;Therapeutic activities;Patient/family education;Neuromuscular re-education    PT Goals (Current goals can be found in the Care Plan section)  Acute Rehab PT Goals Patient Stated Goal: to decrease pain PT Goal Formulation: With patient Time For Goal Achievement: 01/02/23 Potential to Achieve Goals: Fair    Frequency Min 1X/week     Co-evaluation PT/OT/SLP Co-Evaluation/Treatment: Yes Reason for Co-Treatment: For patient/therapist safety;To address functional/ADL transfers;Complexity of the patient's impairments (multi-system involvement) PT goals addressed during session: Mobility/safety with mobility;Proper use of DME OT goals addressed during session: ADL's and self-care;Proper use of Adaptive  equipment and DME       AM-PAC PT "6 Clicks" Mobility  Outcome Measure Help needed turning from your back to your side while in a flat bed without using bedrails?: A Little Help needed moving from lying on your back to sitting on the side of a flat bed without using bedrails?: A Lot Help needed moving to and from a bed to a chair (including a wheelchair)?: A Little Help needed standing up from a chair  using your arms (e.g., wheelchair or bedside chair)?: A Little Help needed to walk in hospital room?: A Lot Help needed climbing 3-5 steps with a railing? : A Lot 6 Click Score: 15    End of Session Equipment Utilized During Treatment: Gait belt;Back brace Activity Tolerance: Patient limited by pain;Patient limited by fatigue (limited by dizzines) Patient left: in bed;with call bell/phone within reach;with bed alarm set;with family/visitor present (Echocardiogram nurse left in room) Nurse Communication: Mobility status (orthostatics, pain) PT Visit Diagnosis: Unsteadiness on feet (R26.81);Repeated falls (R29.6);Muscle weakness (generalized) (M62.81);History of falling (Z91.81);Dizziness and giddiness (R42);Pain Pain - Right/Left:  (B/L) Pain - part of body:  (back)    Time: 6213-0865 PT Time Calculation (min) (ACUTE ONLY): 33 min   Charges:   PT Evaluation $PT Eval Moderate Complexity: 1 Mod PT Treatments $Therapeutic Activity: 8-22 mins PT General Charges $$ ACUTE PT VISIT: 1 Visit          Shauna Hugh, SPT 12/19/2022, 4:13 PM

## 2022-12-19 NOTE — Progress Notes (Signed)
*  PRELIMINARY RESULTS* Echocardiogram 2D Echocardiogram has been performed.  Carolyne Fiscal 12/19/2022, 4:28 PM

## 2022-12-19 NOTE — Plan of Care (Signed)
  Problem: Education: Goal: Ability to describe self-care measures that may prevent or decrease complications (Diabetes Survival Skills Education) will improve Outcome: Progressing Goal: Individualized Educational Video(s) Outcome: Progressing   

## 2022-12-19 NOTE — Progress Notes (Signed)
PT Cancellation Note  Patient Details Name: KACI BEERMANN MRN: [id number] DOB: 01-18-1946   Cancelled Treatment:    Reason Eval/Treat Not Completed: Other (comment) Imminent d/c orders received, pt refused PT session this morning 2/2 just starting her breakfast. PT will follow up at a later time/date.    Shauna Hugh, SPT 12/19/2022, 11:34 AM

## 2022-12-19 NOTE — Evaluation (Signed)
Occupational Therapy Evaluation Patient Details Name: Monica Ray MRN: [id number] DOB: 1945/05/28 Today's Date: 12/19/2022   History of Present Illness Pt is a 77 yo female presenting to the ED with chief concerns of syncopal events. Also presenting with new T11 compression fracture to treat with TLSO brace per neurosurgery. PMH inlcudes insulin-dependent diabetes mellitus type 2, hypertension, depression, anxiety, hyperlipidemia, cervical myelopathy (ACDF in April 2024), aortic athersclerosis, chronic heart failure, CKD, and CAD.   Clinical Impression   Pt was seen for OT/PT evaluation this date. Pt alert and oriented x4, with nurse in room admistering meds with daughter in law at beside. Prior to hospital admission, pt was independent in ADLs/IADL with the exception of requiring assistance fro her husband for LB dressing. Pt lives with her husband in one level home with significant hill to enter the home. Pt states she doesn't use DME at home but has a DME to use. Pt states chronic dizziness during all mobility. Pt presents to acute OT demonstrating impaired ADL performance and functional mobility 2/2 (See OT problem list for additional functional deficits). Pt currently requires CGA to STS from EOB + RW. Pt required MINA for step pivot to [facility] with no DME used. Pt reports feeing dizzy throughout session, states this is her normal. Vitals taken during session: Supine BP: 135/59 (MAP 59), HR 72bpm, Spo2 93%. Sitting BP: 125/62, HR 76bpm, Spo2 98%. Pt would benefit from skilled OT services to address noted impairments and functional limitations (see below for any additional details) in order to maximize safety and independence while minimizing falls risk and caregiver burden. OT will follow acutely.      If plan is discharge home, recommend the following: A little help with walking and/or transfers;A little help with bathing/dressing/bathroom;Help with stairs or ramp for entrance;Assist for  transportation;Assistance with cooking/housework    Functional Status Assessment  Patient has had a recent decline in their functional status and demonstrates the ability to make significant improvements in function in a reasonable and predictable amount of time.  Equipment Recommendations  None recommended by OT    Recommendations for Other Services       Precautions / Restrictions Precautions Precautions: Fall Required Braces or Orthoses: Other Brace Other Brace: TLSO- wear when OOB, may remove brace when showering Restrictions Weight Bearing Restrictions: No      Mobility Bed Mobility Overal bed mobility: Needs Assistance Bed Mobility: Supine to Sit, Sit to Supine     Supine to sit: Mod assist, Used rails Sit to supine: Mod assist   General bed mobility comments: Log rolling technique, MODA trunk and LE management    Transfers Overall transfer level: Needs assistance Equipment used: Rolling walker (2 wheels) Transfers: Sit to/from Stand, Bed to chair/wheelchair/BSC Sit to Stand: Contact guard assist     Step pivot transfers: Min assist            Balance Overall balance assessment: Needs assistance, History of Falls Sitting-balance support: No upper extremity supported, Feet supported Sitting balance-Leahy Scale: Fair     Standing balance support: During functional activity, Bilateral upper extremity supported, No upper extremity supported Standing balance-Leahy Scale: Poor                             ADL either performed or assessed with clinical judgement   ADL Overall ADL's : Needs assistance/impaired                 Upper  Body Dressing : Maximal assistance;Sitting Upper Body Dressing Details (indicate cue type and reason): Donning/doffing TLSO     Toilet Transfer: Minimal assistance;BSC/3in1;Ambulation;Rolling walker (2 wheels)   Toileting- Clothing Manipulation and Hygiene: Contact guard assist;Sit to/from stand        Functional mobility during ADLs: Minimal assistance;Rolling walker (2 wheels);Cueing for safety       Vision Baseline Vision/History: 1 Wears glasses       Perception         Praxis         Pertinent Vitals/Pain Pain Assessment Pain Score: 10-Worst pain ever Pain Location: mid back Pain Descriptors / Indicators: Aching, Discomfort Pain Intervention(s): Limited activity within patient's tolerance, Monitored during session, Repositioned, Premedicated before session     Extremity/Trunk Assessment Upper Extremity Assessment Upper Extremity Assessment: Generalized weakness   Lower Extremity Assessment Lower Extremity Assessment: Generalized weakness;Defer to PT evaluation   Cervical / Trunk Assessment Cervical / Trunk Assessment: Normal;Neck Surgery   Communication Communication Communication: No apparent difficulties Cueing Techniques: Verbal cues;Tactile cues   Cognition Arousal: Alert Behavior During Therapy: WFL for tasks assessed/performed Overall Cognitive Status: Within Functional Limits for tasks assessed                                 General Comments: Alert and oriented x4     General Comments  Pt reports dizziness throughout session, states this is baseline    Exercises Other Exercises Other Exercises: GNF:AOZH of OT, log rolling technique, Safe ADL completion with DME, TLSO donning/doffing technique   Shoulder Instructions      Home Living Family/patient expects to be discharged to:: Private residence Living Arrangements: Spouse/significant other Available Help at Discharge: Family Type of Home: House Home Access: Level entry (steep hill to go up driveway to enter home)     Home Layout: One level     Bathroom Shower/Tub: Producer, television/film/video: Handicapped height Bathroom Accessibility: Yes How Accessible: Accessible via walker Home Equipment: Rollator (4 wheels);Grab bars - tub/shower;Shower seat;BSC/3in1;Other  (comment)   Additional Comments: Pt states her husband also uses a rollator at baseline      Prior Functioning/Environment Prior Level of Function : Needs assist;Working/employed;Driving;History of Falls (last six months)       Physical Assist : Mobility (physical);ADLs (physical) Mobility (physical):  (to get up after each fall)   Mobility Comments: Reports multiple falls due to chronic dizziness, 4-5 falls in the last month, but walks without AD ADLs Comments: Pt is (I) ADLexcpet for socks and IADL, drives, works full time at the [employer] in [place]. Her husband assists her in donning her socks and sometimes pants at baseline. She has multiple reachers. Has CPAP at home. Notes that her BIL hands begin to tingle later in the day; she is able to type at work without issue. States her hands and neck often feel "tired" at the end of the day.        OT Problem List: Decreased strength;Impaired balance (sitting and/or standing);Decreased knowledge of use of DME or AE;Decreased coordination;Decreased activity tolerance      OT Treatment/Interventions: Self-care/ADL training;Therapeutic exercise;Energy conservation;Patient/family education;DME and/or AE instruction;Therapeutic activities;Balance training    OT Goals(Current goals can be found in the care plan section) Acute Rehab OT Goals Patient Stated Goal: No more pain OT Goal Formulation: With patient Time For Goal Achievement: 01/02/23 Potential to Achieve Goals: Good ADL Goals Pt Will Perform Grooming: with  modified independence;standing Pt Will Perform Lower Body Dressing: sit to/from stand;with modified independence Pt Will Transfer to Toilet: with modified independence;ambulating Pt Will Perform Toileting - Clothing Manipulation and hygiene: with modified independence;sit to/from stand  OT Frequency: Min 1X/week    Co-evaluation PT/OT/SLP Co-Evaluation/Treatment: Yes Reason for Co-Treatment: For patient/therapist safety;To  address functional/ADL transfers;Complexity of the patient's impairments (multi-system involvement) PT goals addressed during session: Mobility/safety with mobility;Proper use of DME OT goals addressed during session: ADL's and self-care;Proper use of Adaptive equipment and DME      AM-PAC OT "6 Clicks" Daily Activity     Outcome Measure Help from another person eating meals?: None Help from another person taking care of personal grooming?: A Little Help from another person toileting, which includes using toliet, bedpan, or urinal?: A Little Help from another person bathing (including washing, rinsing, drying)?: A Little Help from another person to put on and taking off regular upper body clothing?: A Little Help from another person to put on and taking off regular lower body clothing?: A Lot 6 Click Score: 18   End of Session Equipment Utilized During Treatment: Gait belt;Rolling walker (2 wheels);Back brace Nurse Communication: Mobility status;Other (comment) (Vitals)  Activity Tolerance: Patient tolerated treatment well Patient left: in bed;with bed alarm set (Echo in room)  OT Visit Diagnosis: Other abnormalities of gait and mobility (R26.89);Dizziness and giddiness (R42);Unsteadiness on feet (R26.81);Muscle weakness (generalized) (M62.81)                Time: 7425-9563 OT Time Calculation (min): 33 min Charges:  OT General Charges $OT Visit: 1 Visit OT Evaluation $OT Eval Moderate Complexity: 1 Mod  [employer], OTS

## 2022-12-20 DIAGNOSIS — N1832 Chronic kidney disease, stage 3b: Secondary | ICD-10-CM | POA: Diagnosis not present

## 2022-12-20 DIAGNOSIS — R55 Syncope and collapse: Secondary | ICD-10-CM | POA: Diagnosis not present

## 2022-12-20 DIAGNOSIS — I272 Pulmonary hypertension, unspecified: Secondary | ICD-10-CM

## 2022-12-20 DIAGNOSIS — I951 Orthostatic hypotension: Secondary | ICD-10-CM | POA: Insufficient documentation

## 2022-12-20 LAB — GLUCOSE, CAPILLARY
Glucose-Capillary: 122 mg/dL — ABNORMAL HIGH (ref 70–99)
Glucose-Capillary: 110 mg/dL — ABNORMAL HIGH (ref 70–99)
Glucose-Capillary: 184 mg/dL — ABNORMAL HIGH (ref 70–99)
Glucose-Capillary: 200 mg/dL — ABNORMAL HIGH (ref 70–99)
Glucose-Capillary: 160 mg/dL — ABNORMAL HIGH (ref 70–99)

## 2022-12-20 MED ORDER — LACTULOSE 10 GM/15ML PO SOLN
20.0000 g | Freq: Once | ORAL | Status: AC
  Administered 2022-12-20: 20 g via ORAL
  Filled 2022-12-20: qty 30

## 2022-12-20 MED ORDER — LACTATED RINGERS IV SOLN: INTRAVENOUS | Status: AC

## 2022-12-20 MED ORDER — ENSURE ENLIVE PO LIQD
237.0000 mL | Freq: Three times a day (TID) | ORAL | Status: DC
  Administered 2022-12-20 – 2022-12-21 (×2): 237 mL via ORAL

## 2022-12-20 MED ORDER — ADULT MULTIVITAMIN W/MINERALS CH
1.0000 | ORAL_TABLET | Freq: Every day | ORAL | Status: DC
  Administered 2022-12-20 – 2022-12-21 (×2): 1 via ORAL
  Filled 2022-12-20 (×2): qty 1

## 2022-12-20 NOTE — TOC Progression Note (Signed)
Transition of Care [facility]) - Progression Note    Patient Details  Name: Monica Ray MRN: [id number] Date of Birth: July 18, 1945  [facility] CM/SW Contact  Truddie Hidden, RN Phone Number: 12/20/2022, 10:13 AM  Clinical Narrative:    Glennon Mac obtained FL2 completed           Expected Discharge Plan and Services                                               Social Determinants of Health (SDOH) Interventions SDOH Screenings   Food Insecurity: No Food Insecurity (12/19/2022)  Housing: Low Risk  (12/19/2022)  Transportation Needs: No Transportation Needs (12/19/2022)  Utilities: Not At Risk (12/19/2022)  Financial Resource Strain: Low Risk  (09/03/2022)   Received from [facility]  Tobacco Use: Low Risk  (12/19/2022)    Readmission Risk Interventions     No data to display

## 2022-12-20 NOTE — Progress Notes (Signed)
Initial Nutrition Assessment  DOCUMENTATION CODES:   Obesity unspecified  INTERVENTION:   -Ensure Enlive po TID, each supplement provides 350 kcal and 20 grams of protein -Magic cup TID with meals, each supplement provides 290 kcal and 9 grams of protein   -MVI with minerals daily -Liberalize diet to regular for widest variety of meal selections  NUTRITION DIAGNOSIS:   Inadequate oral intake related to poor appetite as evidenced by per patient/family report.  GOAL:   Patient will meet greater than or equal to 90% of their needs  MONITOR:   PO intake, Supplement acceptance  REASON FOR ASSESSMENT:   Malnutrition Screening Tool    ASSESSMENT:   Pt with history of insulin-dependent diabetes mellitus type 2, hypertension, depression, anxiety, hyperlipidemia, who presents for chief concerns of syncopal events.  Pt admitted with syncope and orthostatic hypertension.   Reviewed I/O's: +469 ml x 24 hours and +1.5 L since admission  UOP: 1 ml x 24 hours  Spoke with pt and daughter at bedside. Pt reports feeling sad because she is unable to discharge today. Pt still having weakness with standing and dropping blood pressures. Per MD, plan to add fluids today.   Pt is currently on a  heart healthy diet. Noted meal completions 20-100%. Per pt and daughter, pt with very poor appetite secondary to to being unable to taste or smell foods. She shares she lacks the desire to eat because "nothing tastes right". Daughter reports she ate two bites of a hashbrown this morning. Pt shares she can only taste really sweet things, such as chocolate, and has been drinking a lot fo water. Taste changes also started in March, which she thought was related to her C-Pap.   Reviewed wt hx; pt has experienced a 7.9% wt loss over the past 3 months, which is significant for time frame. Pt attributes weight loss to using Ozempic, which she started in March 2024.   Discussed importance of good meal and  supplement intake to promote healing.   Medications reviewed and include vitamin C, vitamin D3, vitamin B-12, lovenox, and lactated ringers infusion @ 75 ml/hr.   Lab Results  Component Value Date   HGBA1C 6.8 (H) 12/19/2022   PTA DM medications are 2 mg ozempic weekly, 40 units tuojeo BID, 10 mg jardiance daily, and 8-24 units humalog daily.   Labs reviewed: CBGS: 102-140 (inpatient orders for glycemic control are 0-5 units insulin aspart daily at bedtime and 0-9 units insulin aspart TID with meals).    Diet Order:   Diet Order             Diet regular Room service appropriate? Yes; Fluid consistency: Thin  Diet effective now                   EDUCATION NEEDS:   Education needs have been addressed  Skin:  Skin Assessment: Reviewed RN Assessment  Last BM:  Unknown  Height:   Ht Readings from Last 1 Encounters:  12/19/22 5\' 4"  (1.626 m)    Weight:   Wt Readings from Last 1 Encounters:  12/20/22 88.9 kg    Ideal Body Weight:  54.5 kg  BMI:  Body mass index is 33.63 kg/m.  Estimated Nutritional Needs:   Kcal:  1700-1900  Protein:  90-105 grams  Fluid:  > 1.7 L    Levada Schilling, RD, LDN, CDCES Registered Dietitian III Certified Diabetes Care and Education Specialist Please refer to [facility] for RD and/or RD on-call/weekend/after hours pager

## 2022-12-20 NOTE — Progress Notes (Addendum)
Occupational Therapy Treatment Patient Details Name: Monica Ray MRN: [id number] DOB: Feb 17, 1945 Today's Date: 12/20/2022   History of present illness Pt is a 77 yo female presenting to the ED with chief concerns of syncopal events. Also presenting with new T11 compression fracture to treat with TLSO brace per neurosurgery. PMH inlcudes insulin-dependent diabetes mellitus type 2, hypertension, depression, anxiety, hyperlipidemia, cervical myelopathy (ACDF in April 2024), aortic athersclerosis, chronic heart failure, CKD, and CAD.   OT comments  Chart reviewed to date, nurse cleared pt for participation in OT tx session targeting improved activity tolerance/ADL performance. Improvements noted in bed mobility, performing with supervision, STS with supervision, amb in room approx 25' with RW with supervision-CGA. Pt performs standing grooming tasks with supervision with RW. Intermittent vcs for RW technique. TLSO donned throughout. BP WNL after mobility. Pt is making progress towards goals, discharge has been updated to reflect progress. OT will follow acutely.       If plan is discharge home, recommend the following:  A little help with walking and/or transfers;A little help with bathing/dressing/bathroom;Help with stairs or ramp for entrance;Assist for transportation;Assistance with cooking/housework   Equipment Recommendations  None recommended by OT (reacher/sock aid)    Recommendations for Other Services      Precautions / Restrictions Precautions Precautions: Fall Precaution Comments: watch BP Required Braces or Orthoses: Other Brace Other Brace: TLSO- wear when OOB, may remove brace when showering Restrictions Weight Bearing Restrictions: No       Mobility Bed Mobility Overal bed mobility: Needs Assistance Bed Mobility: Rolling, Sit to Sidelying Rolling: Supervision       Sit to sidelying: Supervision      Transfers Overall transfer level: Needs assistance Equipment  used: Rolling walker (2 wheels) Transfers: Sit to/from Stand Sit to Stand: Supervision                 Balance Overall balance assessment: Needs assistance, History of Falls Sitting-balance support: No upper extremity supported, Feet supported Sitting balance-Leahy Scale: Good     Standing balance support: During functional activity, Bilateral upper extremity supported Standing balance-Leahy Scale: Fair                             ADL either performed or assessed with clinical judgement   ADL Overall ADL's : Needs assistance/impaired Eating/Feeding: Set up;Sitting   Grooming: Wash/dry hands;Oral care;Standing;Supervision/safety;Contact guard assist Grooming Details (indicate cue type and reason): sink level with RW, intermittent vcs for technique, energy conservation         Upper Body Dressing : Supervision/safety;Sitting Upper Body Dressing Details (indicate cue type and reason): doff TLSO sitting on edge of bed, good carry over of education     Toilet Transfer: Supervision/safety;Rolling walker (2 wheels) Toilet Transfer Details (indicate cue type and reason): simulated         Functional mobility during ADLs: Supervision/safety;Contact guard assist;Rolling walker (2 wheels) (approx 25' in room)      Extremity/Trunk Assessment              Vision       Perception     Praxis      Cognition Arousal: Alert Behavior During Therapy: WFL for tasks assessed/performed Overall Cognitive Status: Within Functional Limits for tasks assessed  Exercises Other Exercises Other Exercises: edu re: ADL modification for safe completion, use of DME for energy conservation techniques    Shoulder Instructions       General Comments BP 143/69 sitting EOB, 150/71 sitting in recliner after mobility, 101/60 in standing (pt requesting to take BP at end of session in standing)- pt asymptomatic     Pertinent Vitals/ Pain       Pain Assessment Pain Assessment: No/denies pain  Home Living                                          Prior Functioning/Environment              Frequency  Min 1X/week        Progress Toward Goals  OT Goals(current goals can now be found in the care plan section)  Progress towards OT goals: Progressing toward goals  Acute Rehab OT Goals Time For Goal Achievement: 01/02/23  Plan      Co-evaluation                 AM-PAC OT "6 Clicks" Daily Activity     Outcome Measure   Help from another person eating meals?: None Help from another person taking care of personal grooming?: None Help from another person toileting, which includes using toliet, bedpan, or urinal?: A Little Help from another person bathing (including washing, rinsing, drying)?: A Little Help from another person to put on and taking off regular upper body clothing?: None Help from another person to put on and taking off regular lower body clothing?: A Little 6 Click Score: 21    End of Session Equipment Utilized During Treatment: Gait belt;Rolling walker (2 wheels);Back brace  OT Visit Diagnosis: Other abnormalities of gait and mobility (R26.89);Dizziness and giddiness (R42);Unsteadiness on feet (R26.81);Muscle weakness (generalized) (M62.81)   Activity Tolerance Patient tolerated treatment well   Patient Left in bed;with bed alarm set;with call bell/phone within reach   Nurse Communication Mobility status        Time: 4742-5956 OT Time Calculation (min): 16 min  Charges: OT General Charges $OT Visit: 1 Visit OT Treatments $Self Care/Home Management : 8-22 mins  Oleta Mouse, OTD OTR/L  12/20/22, 4:08 PM

## 2022-12-20 NOTE — Progress Notes (Signed)
Progress Note   Patient: Monica Ray ION:[id number] DOB: 11-26-1945 DOA: 12/18/2022     2 DOS: the patient was seen and examined on 12/20/2022   Brief hospital course: [name] is a 77 year old female with history of insulin-dependent diabetes mellitus type 2, hypertension, depression, anxiety, hyperlipidemia, who presents to the emergency department for chief concerns of syncopal events.  This is the third syncope episode since May 2024.  At each time, patient was sitting on the weigh scale, looking down.  The last episode was witnessed without seizure activity.  Patient does not have confusion after the event.  Upon arriving the hospital, patient had a profound orthostatic hypotension.  Patient was placed on of hydrocortisone on 11/13, giving fluids on 11/14.   Principal Problem:   Syncope Active Problems:   Type II diabetes mellitus (HCC)   HYPERCHOLESTEROLEMIA   Essential hypertension   Obesity, unspecified   Pulmonary hypertension (HCC)   Hypokalemia   Back pain   CKD stage 3b, GFR 30-44 ml/min (HCC)   Assessment and Plan: * Syncope Orthostatic hypotension. Based on history, patient has recurrent syncope.  I reviewed the patient telemetry strips, no arrhythmia.  No significant hypoglycemia since admission.  Prior echocardiogram showed a moderate pulm hypertension, ejection fraction normal.  Repeat echocardiogram could not assess pulmonary pressure, ejection fraction still 60 to 65% with diastolic dysfunction. Patient had a severe orthostatic hypotension which appear to be the cause of syncope.  We have discontinued all blood pressure medicines at this point.  Also added Florinef 11/13.  Patient still profoundly orthostatic, will give gentle rehydration today. Patient has been seen by PT/OT, recommending nursing home placement.  CKD stage 3b, GFR 30-44 ml/min (HCC) Hypokalemia. Renal function at baseline, potassium normalized.   Back pain T11 compression  fracture. Patient was initially scheduled for surgery by neurosurgery, which will be delayed due to syncope.   Obesity, BMI of 32.51. Exercise advised.   Essential hypertension Blood pressure medicines on hold.   HYPERCHOLESTEROLEMIA Home rosuvastatin 40 mg nightly resumed   Type II diabetes mellitus (HCC) Patient was only on short acting insulin at home, will continue sliding scale insulin for now.      Subjective:  Patient still feels dizzy when standing up.  Otherwise no other complaints.  Physical Exam: Vitals:   12/19/22 2307 12/20/22 0602 12/20/22 0756 12/20/22 0800  BP: 136/62 (!) 145/61    Pulse:      Resp:    18  Temp: 98.4 F (36.9 C) 98.2 F (36.8 C) 97.9 F (36.6 C)   TempSrc: Oral Oral Oral   SpO2:      Weight:  88.9 kg    Height:       General exam: Appears calm and comfortable  Respiratory system: Clear to auscultation. Respiratory effort normal. Cardiovascular system: S1 & S2 heard, RRR. No JVD, murmurs, rubs, gallops or clicks. No pedal edema. Gastrointestinal system: Abdomen is nondistended, soft and nontender. No organomegaly or masses felt. Normal bowel sounds heard. Central nervous system: Alert and oriented. No focal neurological deficits. Extremities: Symmetric 5 x 5 power. Skin: No rashes, lesions or ulcers Psychiatry: Judgement and insight appear normal. Mood & affect appropriate.    Data Reviewed:  Lab results reviewed.  Family Communication: Daughter updated at the bedside.  Disposition: Status is: Inpatient Remains inpatient appropriate because: Severity of disease, IV treatment.     Time spent: 35 minutes  Author: Marrion Coy, MD 12/20/2022 12:20 PM  For on call review [email or web address].

## 2022-12-20 NOTE — [place] FL2 (Signed)
MEDICAID [id number] LEVEL OF CARE FORM     IDENTIFICATION  Patient Name: Monica Ray Birthdate: [birth year] Sex: female Admission Date (Current Location): 12/18/2022  [facility] and [place] Number:  Chiropodist and Address:  [facility], [address], [place], [place] [address]      Provider Number: [id number]  Attending Physician Name and Address:  Marrion Coy, MD  Relative Name and Phone Number:  Sylvester Harder (Daughter)  [phone] [facility])    Current Level of Care: Hospital Recommended Level of Care: Skilled Nursing Facility Prior Approval Number:    Date Approved/Denied:   PASRR Number: [id number]  Discharge Plan: SNF    Current Diagnoses: Patient Active Problem List   Diagnosis Date Noted   Syncope 12/18/2022   Hypokalemia 12/18/2022   Back pain 12/18/2022   CKD stage 3b, GFR 30-44 ml/min (HCC) 12/18/2022   Cervical myelopathy (HCC) 05/16/2022   Cervical spinal stenosis 05/16/2022   Chronic heart failure with preserved ejection fraction (HFpEF) (HCC)    Aortic atherosclerosis (HCC) 10/05/2021   Abnormal screening cardiac CT    Accelerating angina (HCC) 09/05/2018   Dyspnea on exertion 09/05/2018   Effort angina (HCC)    Pulmonary hypertension (HCC)    Neurogenic 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 11/25/2014   Spinal stenosis of lumbar region 11/25/2014   DDD (degenerative disc disease), lumbar 08/30/2014   Lumbar radiculitis 08/30/2014   Type 2 diabetes mellitus, uncontrolled 05/10/2014   Family history of abdominal aortic aneurysm 04/29/2012   Obesity, unspecified 04/29/2012   Family history of ischemic heart disease and other diseases of the circulatory system 04/29/2012   Other screening mammogram 02/20/2011   Type II diabetes mellitus (HCC) 03/01/2008   Essential hypertension 03/01/2008   Allergic rhinitis 03/01/2008   HYPERCHOLESTEROLEMIA 06/12/2007    Orientation RESPIRATION BLADDER Height &  Weight     Self, Time, Situation, Place  Normal Continent Weight: 88.9 kg Height:  5\' 4"  (162.6 cm)  BEHAVIORAL SYMPTOMS/MOOD NEUROLOGICAL BOWEL NUTRITION STATUS  Other (Comment) (n/a)  (n/a) Continent Diet  AMBULATORY STATUS COMMUNICATION OF NEEDS Skin   Limited Assist Verbally Normal                       Personal Care Assistance Level of Assistance  Bathing, Dressing Bathing Assistance: Limited assistance   Dressing Assistance: Limited assistance     Functional Limitations Info  Sight, Hearing Sight Info: Adequate Hearing Info: Adequate      SPECIAL CARE FACTORS FREQUENCY  PT (By licensed PT), OT (By licensed OT)     PT Frequency: Min 2x weekly OT Frequency: Min 2x weekly            Contractures Contractures Info: Not present    Additional Factors Info  Code Status, Allergies Code Status Info: FULL Allergies Info: Tegaderm Ag Mesh (Silver), Amoxicillin, Metformin, Doxycycline, Oxycodone, Tape, Azithromycin, Clindamycin, Fluconazole, Simvastatin           Current Medications (12/20/2022):  This is the current hospital active medication list Current Facility-Administered Medications  Medication Dose Route Frequency Provider Last Rate Last Admin   acetaminophen (TYLENOL) tablet 650 mg  650 mg Oral Q6H PRN Cox, Amy N, DO   650 mg at 12/20/22 1020   Or   acetaminophen (TYLENOL) suppository 650 mg  650 mg Rectal Q6H PRN Cox, Amy N, DO       ascorbic acid (VITAMIN C) tablet 250 mg  250 mg Oral Daily Cox, Amy  N, DO   250 mg at 12/20/22 1005   cholecalciferol (VITAMIN D3) 25 MCG (1000 UNIT) tablet 2,000 Units  2,000 Units Oral Daily Cox, Amy N, DO   2,000 Units at 12/20/22 1005   cyanocobalamin (VITAMIN B12) tablet 1,000 mcg  1,000 mcg Oral Daily Cox, Amy N, DO   1,000 mcg at 12/20/22 1005   diphenhydrAMINE (BENADRYL) capsule 25 mg  25 mg Oral Q6H PRN Marrion Coy, MD   25 mg at 12/19/22 1852   DULoxetine (CYMBALTA) DR capsule 60 mg  60 mg Oral BH-q7a Cox, Amy  N, DO   60 mg at 12/20/22 1005   enoxaparin (LOVENOX) injection 45 mg  45 mg Subcutaneous Q24H Cox, Amy N, DO   45 mg at 12/19/22 2201   feeding supplement (ENSURE ENLIVE / ENSURE PLUS) liquid 237 mL  237 mL Oral TID BM Marrion Coy, MD   237 mL at 12/20/22 1023   fludrocortisone (FLORINEF) tablet 0.1 mg  0.1 mg Oral Daily Marrion Coy, MD   0.1 mg at 12/20/22 1005   hydrALAZINE (APRESOLINE) injection 5 mg  5 mg Intravenous Q6H PRN Cox, Amy N, DO       insulin aspart (novoLOG) injection 0-5 Units  0-5 Units Subcutaneous QHS Cox, Amy N, DO   0 Units at 12/18/22 2239   insulin aspart (novoLOG) injection 0-9 Units  0-9 Units Subcutaneous TID WC Cox, Amy N, DO   1 Units at 12/19/22 1715   lactated ringers infusion   Intravenous Continuous Marrion Coy, MD 75 mL/hr at 12/20/22 0845 New Bag at 12/20/22 0845   lidocaine (LIDODERM) 5 % 2 patch  2 patch Transdermal Q24H Cox, Amy N, DO   2 patch at 12/19/22 1715   multivitamin with minerals tablet 1 tablet  1 tablet Oral Daily Marrion Coy, MD   1 tablet at 12/20/22 1020   nitroGLYCERIN (NITROSTAT) SL tablet 0.4 mg  0.4 mg Sublingual Q5 min PRN Cox, Amy N, DO       ondansetron (ZOFRAN) tablet 4 mg  4 mg Oral Q6H PRN Cox, Amy N, DO       Or   ondansetron (ZOFRAN) injection 4 mg  4 mg Intravenous Q6H PRN Cox, Amy N, DO       rosuvastatin (CRESTOR) tablet 40 mg  40 mg Oral QHS Cox, Amy N, DO   40 mg at 12/19/22 2159   senna-docusate (Senokot-S) tablet 1 tablet  1 tablet Oral QHS PRN Cox, Amy N, DO       sodium chloride flush (NS) 0.9 % injection 3 mL  3 mL Intravenous Q12H Cox, Amy N, DO   3 mL at 12/20/22 0845     Discharge Medications: Please see discharge summary for a list of discharge medications.  Relevant Imaging Results:  Relevant Lab Results:   Additional Information SSN# [id number]  Truddie Hidden, RN

## 2022-12-20 NOTE — TOC Progression Note (Signed)
Transition of Care [facility]) - Progression Note    Patient Details  Name: Monica Ray MRN: [id number] Date of Birth: 08-24-1945  Transition of Care [facility]) CM/SW Contact  Truddie Hidden, RN Phone Number: 12/20/2022, 12:25 PM  Clinical Narrative:    RNCM spoke with patient at bedside regarding therapy's recommendation for SNF. She is not agreeable to SNF. "I'm going home." RNCM advised HH could be arranged. She is requesting [facility] and stated she has all needed DME. Patient advised the accepting agency will contact her directly to scheduled SOC within 48 post discharge.  Referral sent to Coralee North from [place].          Expected Discharge Plan and Services                                               Social Determinants of Health (SDOH) Interventions SDOH Screenings   Food Insecurity: No Food Insecurity (12/19/2022)  Housing: Low Risk  (12/19/2022)  Transportation Needs: No Transportation Needs (12/19/2022)  Utilities: Not At Risk (12/19/2022)  Financial Resource Strain: Low Risk  (09/03/2022)   Received from [facility]  Tobacco Use: Low Risk  (12/19/2022)    Readmission Risk Interventions     No data to display

## 2022-12-20 NOTE — Progress Notes (Signed)
Physical Therapy Treatment Patient Details Name: Monica Ray MRN: [id number] DOB: Jun 24, 1945 Today's Date: 12/20/2022   History of Present Illness Pt is a 77 yo female presenting to the ED with chief concerns of syncopal events. Also presenting with new T11 compression fracture to treat with TLSO brace per neurosurgery. PMH inlcudes insulin-dependent diabetes mellitus type 2, hypertension, depression, anxiety, hyperlipidemia, cervical myelopathy (ACDF in April 2024), aortic athersclerosis, chronic heart failure, CKD, and CAD.    PT Comments  Pt alert and oriented, reporting tolerable 5/10 back pain (pre-medicated before session), and receptive to therapy this session. Pt required minA to don TLSO sitting EOB. Pt asymptomatic with all mobility (BP 143/69 sitting EOB, 150/71 sitting in recliner after mobility, 101/60 in standing per pt requesting to take BP at end of session), and is progressing well towards her mobility goals. Pt required supervision and cueing for log rolling technique to sit EOB, supervision for STS from various surfaces with RW, and CGA for amb with RW. Pt tolerated increased activity this session without an increase in pain or dizziness. Pt would continue to benefit from skilled PT to address balance, strength, and endurance deficits.    If plan is discharge home, recommend the following: Direct supervision/assist for financial management;Help with stairs or ramp for entrance;Assistance with cooking/housework;Assist for transportation;A little help with bathing/dressing/bathroom;A little help with walking and/or transfers   Can travel by private vehicle     Yes  Equipment Recommendations  None recommended by PT    Recommendations for Other Services       Precautions / Restrictions Precautions Precautions: Fall Precaution Comments: watch BP Required Braces or Orthoses: Other Brace Other Brace: TLSO- wear when OOB, may remove brace when showering Restrictions Weight  Bearing Restrictions: No     Mobility  Bed Mobility Overal bed mobility: Needs Assistance Bed Mobility: Supine to Sit     Supine to sit: Used rails, Supervision     General bed mobility comments: verbal cues for log rolling technique    Transfers Overall transfer level: Needs assistance Equipment used: Rolling walker (2 wheels) Transfers: Sit to/from Stand Sit to Stand: Supervision           General transfer comment: supervision to stand from EOB, toilet, and recliner with RW; pt denies dizziness    Ambulation/Gait Ambulation/Gait assistance: Contact guard assist Gait Distance (Feet): 40 Feet Assistive device: Rolling walker (2 wheels) Gait Pattern/deviations: WFL(Within Functional Limits) Gait velocity: decreased     General Gait Details: pt walked to toilet, then around room and back to recliner without any reports of dizziness or increased pain   Stairs             Wheelchair Mobility     Tilt Bed    Modified Rankin (Stroke Patients Only)       Balance Overall balance assessment: Needs assistance, History of Falls Sitting-balance support: No upper extremity supported, Feet supported Sitting balance-Leahy Scale: Good Sitting balance - Comments: suprvision for sitting EOB   Standing balance support: During functional activity, Bilateral upper extremity supported Standing balance-Leahy Scale: Good Standing balance comment: able to stand and don underwear without UE and superivision                            Cognition Arousal: Alert Behavior During Therapy: WFL for tasks assessed/performed Overall Cognitive Status: Within Functional Limits for tasks assessed  Exercises      General Comments General comments (skin integrity, edema, etc.): BP 143/69 sitting EOB, 150/71 sitting in recliner after mobility, 101/60 in standing (pt requesting to take BP at end of session in  standing)- pt asymptomatic      Pertinent Vitals/Pain Pain Assessment Pain Assessment: 0-10 Pain Score: 5  Pain Location: mid back Pain Descriptors / Indicators: Aching, Discomfort Pain Intervention(s): Limited activity within patient's tolerance, Repositioned, Monitored during session, Premedicated before session    Home Living                          Prior Function            PT Goals (current goals can now be found in the care plan section) Acute Rehab PT Goals Patient Stated Goal: to decrease pain PT Goal Formulation: With patient Time For Goal Achievement: 01/02/23 Potential to Achieve Goals: Fair Progress towards PT goals: Progressing toward goals    Frequency    Min 1X/week      PT Plan      Co-evaluation              AM-PAC PT "6 Clicks" Mobility   Outcome Measure  Help needed turning from your back to your side while in a flat bed without using bedrails?: A Little Help needed moving from lying on your back to sitting on the side of a flat bed without using bedrails?: A Little Help needed moving to and from a bed to a chair (including a wheelchair)?: A Little Help needed standing up from a chair using your arms (e.g., wheelchair or bedside chair)?: A Little Help needed to walk in hospital room?: A Little Help needed climbing 3-5 steps with a railing? : A Lot 6 Click Score: 17    End of Session Equipment Utilized During Treatment: Gait belt;Back brace Activity Tolerance: Patient tolerated treatment well;No increased pain Patient left: with chair alarm set;in chair;with call bell/phone within reach Nurse Communication: Mobility status PT Visit Diagnosis: Unsteadiness on feet (R26.81);Repeated falls (R29.6);Muscle weakness (generalized) (M62.81);History of falling (Z91.81);Dizziness and giddiness (R42);Pain Pain - Right/Left:  (B/L) Pain - part of body:  (back)     Time: 1610-9604 PT Time Calculation (min) (ACUTE ONLY): 25  min  Charges:    $Gait Training: 8-22 mins $Therapeutic Activity: 8-22 mins PT General Charges $$ ACUTE PT VISIT: 1 Visit                       Shauna Hugh 12/20/2022, 4:07 PM

## 2022-12-21 DIAGNOSIS — E119 Type 2 diabetes mellitus without complications: Secondary | ICD-10-CM | POA: Diagnosis not present

## 2022-12-21 DIAGNOSIS — R55 Syncope and collapse: Secondary | ICD-10-CM | POA: Diagnosis not present

## 2022-12-21 DIAGNOSIS — Z794 Long term (current) use of insulin: Secondary | ICD-10-CM | POA: Diagnosis not present

## 2022-12-21 LAB — BASIC METABOLIC PANEL
Anion gap: 9 (ref 5–15)
BUN: 17 mg/dL (ref 8–23)
CO2: 24 mmol/L (ref 22–32)
Calcium: 8.7 mg/dL — ABNORMAL LOW (ref 8.9–10.3)
Chloride: 106 mmol/L (ref 98–111)
Creatinine, Ser: 0.98 mg/dL (ref 0.44–1.00)
GFR, Estimated: >60 mL/min (ref >60)
Glucose, Bld: 154 mg/dL — ABNORMAL HIGH (ref 70–99)
Potassium: 3.3 mmol/L — ABNORMAL LOW (ref 3.5–5.1)
Sodium: 139 mmol/L (ref 135–145)

## 2022-12-21 LAB — GLUCOSE, CAPILLARY: Glucose-Capillary: 149 mg/dL — ABNORMAL HIGH (ref 70–99)

## 2022-12-21 LAB — MAGNESIUM: Magnesium: 2.1 mg/dL (ref 1.7–2.4)

## 2022-12-21 MED ORDER — FLUDROCORTISONE ACETATE 0.1 MG PO TABS: 0.1000 mg | ORAL_TABLET | Freq: Every day | ORAL | 0 refills | Status: DC

## 2022-12-21 MED ORDER — POTASSIUM CHLORIDE CRYS ER 20 MEQ PO TBCR: 20.0000 meq | EXTENDED_RELEASE_TABLET | Freq: Every day | ORAL | 0 refills | Status: DC

## 2022-12-21 MED ORDER — TOUJEO MAX SOLOSTAR 300 UNIT/ML ~~LOC~~ SOPN: 10.0000 [IU] | PEN_INJECTOR | Freq: Every evening | SUBCUTANEOUS | Status: DC

## 2022-12-21 MED ORDER — POTASSIUM CHLORIDE CRYS ER 20 MEQ PO TBCR
40.0000 meq | EXTENDED_RELEASE_TABLET | ORAL | Status: AC
Start: 2022-12-21 — End: 2022-12-21
  Administered 2022-12-21 (×2): 40 meq via ORAL
  Filled 2022-12-21 (×2): qty 2

## 2022-12-21 NOTE — Progress Notes (Signed)
Patient discharging home. All DC paperwork reviewed and sent with pt. Pt left with all personal belongings with daughter via private vehicle

## 2022-12-21 NOTE — TOC Transition Note (Signed)
Transition of Care [facility]) - CM/SW Discharge Note   Patient Details  Name: Monica Ray MRN: [id number] Date of Birth: 1945/10/22  Transition of Care [facility]) CM/SW Contact:  Truddie Hidden, RN Phone Number: 12/21/2022, 11:15 AM   Clinical Narrative:    Coralee North from [place] notified of discharge home.   TOC signing off     Final next level of care: Home w Home Health Services Barriers to Discharge: Barriers Resolved   Patient Goals and CMS Choice      Discharge Placement                      Patient and family notified of of transfer: 12/21/22  Discharge Plan and Services Additional resources added to the After Visit Summary for                            [facility] Arranged: PT, OT [facility] Agency: [facility] Date [facility] Agency Contacted: 12/21/22 Time HH Agency Contacted: [phone] Representative spoke with at [facility] Agency: Coralee North  Social Determinants of Health (SDOH) Interventions SDOH Screenings   Food Insecurity: No Food Insecurity (12/19/2022)  Housing: Low Risk  (12/19/2022)  Transportation Needs: No Transportation Needs (12/19/2022)  Utilities: Not At Risk (12/19/2022)  Financial Resource Strain: Low Risk  (09/03/2022)   Received from [facility]  Tobacco Use: Low Risk  (12/19/2022)     Readmission Risk Interventions     No data to display

## 2022-12-21 NOTE — Discharge Summary (Signed)
Physician Discharge Summary   Patient: Monica Ray MRN: [id number] DOB: 10/15/1945  Admit date:     12/18/2022  Discharge date: 12/21/22  Discharge Physician: Marrion Coy   PCP: Patrice Paradise, MD   Recommendations at discharge:   Follow-up with PCP in 1 week.  Discharge Diagnoses: Principal Problem:   Syncope Active Problems:   Type II diabetes mellitus (HCC)   HYPERCHOLESTEROLEMIA   Essential hypertension   Obesity, unspecified   Pulmonary hypertension (HCC)   Hypokalemia   Back pain   CKD stage 3b, GFR 30-44 ml/min (HCC)   Orthostatic hypotension  Resolved Problems:   * No resolved hospital problems. *  Hospital Course: Monica Ray is a 77 year old female with history of insulin-dependent diabetes mellitus type 2, hypertension, depression, anxiety, hyperlipidemia, who presents to the emergency department for chief concerns of syncopal events.  This is the third syncope episode since May 2024.  At each time, patient was sitting on the weigh scale, looking down.  The last episode was witnessed without seizure activity.  Patient does not have confusion after the event.  Upon arriving the hospital, patient had a profound orthostatic hypotension.  Patient was placed on of hydrocortisone on 11/13, giving fluids on 11/14. Orthostatic hypotension much improved today, medically stable for discharge.  Assessment and Plan: * Syncope Orthostatic hypotension. Based on history, patient has recurrent syncope.  I reviewed the patient telemetry strips, no arrhythmia.  No significant hypoglycemia since admission.  Prior echocardiogram showed a moderate pulm hypertension, ejection fraction normal.  Repeat echocardiogram could not assess pulmonary pressure, ejection fraction still 60 to 65% with diastolic dysfunction. Patient had a severe orthostatic hypotension which appear to be the cause of syncope.  We have discontinued all blood pressure medicines at this point.  Also added  Florinef 11/13.   Patient also received gentle rehydration, orthostasis much improved today. I will hold off all blood pressure medicines, continue Florinef.  Patient was also advised to stand up slowly to avoid a fall.  Follow-up with PCP as outpatient.   CKD stage 3b, GFR 30-44 ml/min (HCC) Hypokalemia. Renal function had improved, potassium still low today after fluids.  Give 80 mEq of KCl orally for potassium 3.3.  I will also give additional 20 mEq daily x 4 days.   Back pain T11 compression fracture. Patient was initially scheduled for surgery by neurosurgery, which will be delayed due to syncope.   Obesity, BMI of 32.51. Exercise advised.   Essential hypertension Blood pressure medicines on hold.   HYPERCHOLESTEROLEMIA Home rosuvastatin 40 mg nightly resumed   Type II diabetes mellitus (HCC) Patient glucose not significantly elevated with sliding scale insulin in the hospital.  Resume all oral diabetic medicines, but reduced the dose of insulin.  Follow-up with PCP for additional adjustment.      Consultants: None Procedures performed: None  Disposition: Home health Diet recommendation:  Discharge Diet Orders (From admission, onward)     Start     Ordered   12/21/22 0000  Diet - low sodium heart healthy        12/21/22 0959           Cardiac diet DISCHARGE MEDICATION: Allergies as of 12/21/2022       Reactions   Tegaderm Ag Mesh [silver] Rash   Amoxicillin Hives, Rash   Did it involve swelling of the face/tongue/throat, SOB, or low BP? No Did it involve sudden or severe rash/hives, skin peeling, or any reaction on the inside of your  mouth or nose? No Did you need to seek medical attention at a hospital or doctor's office? No When did it last happen?  ~2015  If all above answers are "NO", may proceed with cephalosporin use.   Metformin Diarrhea, Nausea And Vomiting   Doxycycline Nausea Only   Oxycodone Itching   Tape Other (See Comments)   Caused  burning sensation, Tagaderm    Azithromycin Rash, Itching   Clindamycin Hives, Rash   Fluconazole Itching   Simvastatin Other (See Comments)   Back pain        Medication List     STOP taking these medications    ascorbic acid 500 MG tablet Commonly known as: VITAMIN C   clotrimazole-betamethasone cream Commonly known as: LOTRISONE   diltiazem 120 MG 24 hr capsule Commonly known as: CARDIZEM CD   furosemide 40 MG tablet Commonly known as: LASIX   insulin lispro 100 UNIT/ML injection Commonly known as: HUMALOG       TAKE these medications    cyanocobalamin 1000 MCG tablet Commonly known as: VITAMIN B12 Take 1,000 mcg by mouth daily.   DULoxetine 60 MG capsule Commonly known as: CYMBALTA Take 60 mg by mouth every morning.   fludrocortisone 0.1 MG tablet Commonly known as: FLORINEF Take 1 tablet (0.1 mg total) by mouth daily. Start taking on: December 22, 2022   Jardiance 10 MG Tabs tablet Generic drug: empagliflozin TAKE 1 TABLET(10 MG) BY MOUTH DAILY BEFORE BREAKFAST   nitroGLYCERIN 0.4 MG SL tablet Commonly known as: Nitrostat Place 1 tablet (0.4 mg total) under the tongue every 5 (five) minutes as needed for chest pain.   Ozempic (2 MG/DOSE) 8 MG/3ML Sopn Generic drug: Semaglutide (2 MG/DOSE) Inject 2 mg as directed once a week. Mondays   potassium chloride SA 20 MEQ tablet Commonly known as: KLOR-CON M Take 1 tablet (20 mEq total) by mouth daily for 4 days.   rosuvastatin 40 MG tablet Commonly known as: CRESTOR TAKE 1 TABLET(40 MG) BY MOUTH DAILY   Toujeo Max SoloStar 300 UNIT/ML Solostar Pen Generic drug: insulin glargine (2 Unit Dial) Inject 10 Units into the skin at bedtime. What changed: how much to take   Vitamin D3 50 MCG (2000 UT) Tabs Take 2,000 Units by mouth daily.        Follow-up Information     Patrice Paradise, MD Follow up in 1 week(s).   Specialty: Physician Assistant Contact information: [address] [facility] [place] [address] [phone]                Discharge Exam: Filed Weights   12/19/22 0336 12/20/22 0602 12/21/22 0444  Weight: 85.9 kg 88.9 kg 88.3 kg   General exam: Appears calm and comfortable  Respiratory system: Clear to auscultation. Respiratory effort normal. Cardiovascular system: S1 & S2 heard, RRR. No JVD, murmurs, rubs, gallops or clicks. No pedal edema. Gastrointestinal system: Abdomen is nondistended, soft and nontender. No organomegaly or masses felt. Normal bowel sounds heard. Central nervous system: Alert and oriented. No focal neurological deficits. Extremities: Symmetric 5 x 5 power. Skin: No rashes, lesions or ulcers Psychiatry: Judgement and insight appear normal. Mood & affect appropriate.    Condition at discharge: good  The results of significant diagnostics from this hospitalization (including imaging, microbiology, ancillary and laboratory) are listed below for reference.   Imaging Studies: ECHOCARDIOGRAM COMPLETE  Result Date: 12/19/2022    ECHOCARDIOGRAM REPORT   Patient Name:   SHARLIE PICKREN [facility] Date  of Exam: 12/19/2022 Medical Rec #:  [id number]      Height:       64.0 in Accession #:    [id number]     Weight:       189.4 lb Date of Birth:  1945-11-21       BSA:          1.912 m Patient Age:    77 years       BP:           149/68 mmHg Patient Gender: F              HR:           73 bpm. Exam Location:  [facility] Procedure: 2D Echo, Cardiac Doppler and Color Doppler Indications:     Syncope  History:         Patient has prior history of Echocardiogram examinations, most                  recent 09/21/2021. CHF, Pulmonary HTN, Signs/Symptoms:Syncope,                  Dizziness/Lightheadedness, Chest Pain and Dyspnea; Risk                  Factors:Hypertension, Diabetes, Dyslipidemia and Non-Smoker.  Sonographer:     Mikki Harbor Referring Phys:  [id number] AMY N COX Diagnosing Phys: Julien Nordmann MD  Sonographer Comments: Technically  difficult study due to poor echo windows and suboptimal parasternal window. IMPRESSIONS  1. Left ventricular ejection fraction, by estimation, is 60 to 65%. The left ventricle has normal function. The left ventricle has no regional wall motion abnormalities. Left ventricular diastolic parameters are consistent with Grade I diastolic dysfunction (impaired relaxation).  2. Right ventricular systolic function is normal. The right ventricular size is normal. Tricuspid regurgitation signal is inadequate for assessing PA pressure.  3. The mitral valve is normal in structure. No evidence of mitral valve regurgitation. No evidence of mitral stenosis. Moderate mitral annular calcification.  4. The aortic valve is normal in structure. There is moderate calcification of the aortic valve. Aortic valve regurgitation is not visualized. Aortic valve sclerosis/calcification is present, without any evidence of aortic stenosis.  5. The inferior vena cava is normal in size with greater than 50% respiratory variability, suggesting right atrial pressure of 3 mmHg. FINDINGS  Left Ventricle: Left ventricular ejection fraction, by estimation, is 60 to 65%. The left ventricle has normal function. The left ventricle has no regional wall motion abnormalities. The left ventricular internal cavity size was normal in size. There is  no left ventricular hypertrophy. Left ventricular diastolic parameters are consistent with Grade I diastolic dysfunction (impaired relaxation). Right Ventricle: The right ventricular size is normal. No increase in right ventricular wall thickness. Right ventricular systolic function is normal. Tricuspid regurgitation signal is inadequate for assessing PA pressure. Left Atrium: Left atrial size was normal in size. Right Atrium: Right atrial size was normal in size. Pericardium: There is no evidence of pericardial effusion. Mitral Valve: The mitral valve is normal in structure. There is mild calcification of the mitral  valve leaflet(s). Moderate mitral annular calcification. No evidence of mitral valve regurgitation. No evidence of mitral valve stenosis. MV peak gradient, 9.1 mmHg. The mean mitral valve gradient is 3.0 mmHg. Tricuspid Valve: The tricuspid valve is normal in structure. Tricuspid valve regurgitation is not demonstrated. No evidence of tricuspid stenosis. Aortic Valve: The aortic valve is normal in structure. There is moderate calcification  of the aortic valve. Aortic valve regurgitation is not visualized. Aortic valve sclerosis/calcification is present, without any evidence of aortic stenosis. Aortic valve mean gradient measures 5.0 mmHg. Aortic valve peak gradient measures 10.9 mmHg. Aortic valve area, by VTI measures 2.29 cm. Pulmonic Valve: The pulmonic valve was normal in structure. Pulmonic valve regurgitation is not visualized. No evidence of pulmonic stenosis. Aorta: The aortic root is normal in size and structure. Venous: The inferior vena cava is normal in size with greater than 50% respiratory variability, suggesting right atrial pressure of 3 mmHg. IAS/Shunts: No atrial level shunt detected by color flow Doppler.  LEFT VENTRICLE PLAX 2D LVIDd:         3.60 cm     Diastology LVIDs:         2.75 cm     LV e' medial:    8.16 cm/s LV PW:         1.00 cm     LV E/e' medial:  10.7 LV IVS:        1.30 cm     LV e' lateral:   6.96 cm/s LVOT diam:     1.90 cm     LV E/e' lateral: 12.5 LV SV:         74 LV SV Index:   39 LVOT Area:     2.84 cm  LV Volumes (MOD) LV vol d, MOD A2C: 58.2 ml LV vol d, MOD A4C: 58.9 ml LV vol s, MOD A2C: 15.6 ml LV vol s, MOD A4C: 21.6 ml LV SV MOD A2C:     42.6 ml LV SV MOD A4C:     58.9 ml LV SV MOD BP:      43.1 ml RIGHT VENTRICLE RV Basal diam:  3.70 cm RV Mid diam:    3.70 cm RV S prime:     12.50 cm/s LEFT ATRIUM             Index        RIGHT ATRIUM           Index LA diam:        4.00 cm 2.09 cm/m   RA Area:     15.00 cm LA Vol (A2C):   38.1 ml 19.93 ml/m  RA Volume:    39.10 ml  20.45 ml/m LA Vol (A4C):   40.8 ml 21.34 ml/m LA Biplane Vol: 41.2 ml 21.55 ml/m  AORTIC VALVE AV Area (Vmax):    2.01 cm AV Area (Vmean):   2.27 cm AV Area (VTI):     2.29 cm AV Vmax:           165.00 cm/s AV Vmean:          97.600 cm/s AV VTI:            0.325 m AV Peak Grad:      10.9 mmHg AV Mean Grad:      5.0 mmHg LVOT Vmax:         117.00 cm/s LVOT Vmean:        78.000 cm/s LVOT VTI:          0.262 m LVOT/AV VTI ratio: 0.81  AORTA Ao Root diam: 3.00 cm MITRAL VALVE MV Area (PHT): 2.54 cm     SHUNTS MV Area VTI:   1.82 cm     Systemic VTI:  0.26 m MV Peak grad:  9.1 mmHg     Systemic Diam: 1.90 cm MV Mean grad:  3.0 mmHg MV  Vmax:       1.51 m/s MV Vmean:      81.1 cm/s MV Decel Time: 299 msec MV E velocity: 87.00 cm/s MV A velocity: 126.00 cm/s MV E/A ratio:  0.69 Julien Nordmann MD Electronically signed by Julien Nordmann MD Signature Date/Time: 12/19/2022/4:59:01 PM    Final    MR BRAIN WO CONTRAST  Result Date: 12/18/2022 CLINICAL DATA:  Provided history: Mental status change, unknown cause. Multiple syncopal episodes. EXAM: MRI HEAD WITHOUT CONTRAST TECHNIQUE: Multiplanar, multiecho pulse sequences of the brain and surrounding structures were obtained without intravenous contrast. COMPARISON:  Head CT 12/18/2022. FINDINGS: Mild intermittent motion degradation. Brain: No age advanced or lobar predominant parenchymal atrophy. Multifocal T2 FLAIR hyperintense signal abnormality within the cerebral white matter and pons, nonspecific but compatible with mild chronic small vessel ischemic disease. There is no acute infarct. No evidence of an intracranial mass. No chronic intracranial blood products. No extra-axial fluid collection. No midline shift. Vascular: Maintained flow voids within the proximal large arterial vessels. Skull and upper cervical spine: No focal worrisome marrow lesion. Sinuses/Orbits: No mass or acute finding within the imaged orbits. Prior bilateral ocular lens  replacement. No significant paranasal sinus disease. Other: Mild left parietal scalp edema. IMPRESSION: 1. Motion degraded exam. 2. No evidence of an acute intracranial abnormality. 3. Mild chronic small vessel ischemic changes within the cerebral white matter and pons 4. Mild left parietal scalp edema, possibly posttraumatic in etiology. Electronically Signed   By: Jackey Loge D.O.   On: 12/18/2022 19:51   DG Chest 1 View  Result Date: 12/18/2022 CLINICAL DATA:  Syncope EXAM: CHEST  1 VIEW COMPARISON:  X-ray 12/01/2015 FINDINGS: Hyperinflation. No consolidation, pneumothorax or effusion. Normal cardiopericardial silhouette without edema. Films are under penetrated. Fixation hardware along the lower cervical spine. IMPRESSION: No acute cardiopulmonary disease. Electronically Signed   By: Karen Kays M.D.   On: 12/18/2022 18:05   CT Lumbar Spine Wo Contrast  Result Date: 12/18/2022 CLINICAL DATA:  Fall.  Low back pain. EXAM: CT LUMBAR SPINE WITHOUT CONTRAST TECHNIQUE: Multidetector CT imaging of the lumbar spine was performed without intravenous contrast administration. Multiplanar CT image reconstructions were also generated. RADIATION DOSE REDUCTION: This exam was performed according to the departmental dose-optimization program which includes automated exposure control, adjustment of the mA and/or kV according to patient size and/or use of iterative reconstruction technique. COMPARISON:  Lumbar spine MRI 03/19/2022. CT lumbar spine myelogram 10/28/2015. FINDINGS: Segmentation: Transitional lumbosacral anatomy. To maintain consistency with prior reporting, there is left hemi sacralization of the L5 vertebral segment and absent ribs at T12. Alignment: Unchanged trace retrolisthesis of L1 on L2. Vertebrae: Mild compression deformity of the T11 vertebral body with cortical disruption laterally (coronal image 37 series 7), suggesting acute compression fracture. Severe degenerative endplate changes at L1-2.  Postoperative changes of prior L3-4 laminectomy and L3-S1 posterior spinal fusion. Paraspinal and other soft tissues: Atherosclerotic calcifications of the abdominal aorta and its branches. Disc levels: Unchanged multilevel lumbar spondylosis, worst at L1-2, where there is severe spinal canal stenosis and severe bilateral neural foraminal narrowing. IMPRESSION: 1. Mild compression deformity of the T11 vertebral body with cortical disruption laterally, suggesting acute compression fracture. 2. Unchanged multilevel lumbar spondylosis, worst at L1-2, where there is severe spinal canal stenosis and severe bilateral neural foraminal narrowing. 3. Postoperative changes of prior L3-4 laminectomy and L3-S1 posterior spinal fusion. Aortic Atherosclerosis (ICD10-I70.0). Electronically Signed   By: Orvan Falconer M.D.   On: 12/18/2022 14:08   CT  Cervical Spine Wo Contrast  Result Date: 12/18/2022 CLINICAL DATA:  Syncopal episode with fall. Trauma to the head and neck. EXAM: CT CERVICAL SPINE WITHOUT CONTRAST TECHNIQUE: Multidetector CT imaging of the cervical spine was performed without intravenous contrast. Multiplanar CT image reconstructions were also generated. RADIATION DOSE REDUCTION: This exam was performed according to the departmental dose-optimization program which includes automated exposure control, adjustment of the mA and/or kV according to patient size and/or use of iterative reconstruction technique. COMPARISON:  Radiography 11/08/2022.  MRI 03/19/2022. FINDINGS: Alignment: No malalignment. Skull base and vertebrae: No regional fracture. Chronic fusion from C4 through C7. No evidence of hardware complication. Soft tissues and spinal canal: No traumatic soft tissue finding. Disc levels: The foramen magnum is widely patent. There is ordinary mild osteoarthritis of the C1-2 articulation but no encroachment upon the neural structures. C2-3: Bilateral facet osteoarthritis.  No stenosis. C3-4: Bilateral facet  osteoarthritis.  No stenosis. C4 through C7: Chronic fusion. Canal and foramina sufficiently patent. C7-T1: Chronic facet osteoarthritis.  No stenosis. Upper chest: Negative Other: None IMPRESSION: No acute or traumatic finding. Chronic fusion from C4 through C7. No evidence of hardware complication. Chronic facet osteoarthritis at C2-3, C3-4 and C7-T1. Electronically Signed   By: Paulina Fusi M.D.   On: 12/18/2022 13:40   CT Head Wo Contrast  Result Date: 12/18/2022 CLINICAL DATA:  Syncopal episode and fall. EXAM: CT HEAD WITHOUT CONTRAST TECHNIQUE: Contiguous axial images were obtained from the base of the skull through the vertex without intravenous contrast. RADIATION DOSE REDUCTION: This exam was performed according to the departmental dose-optimization program which includes automated exposure control, adjustment of the mA and/or kV according to patient size and/or use of iterative reconstruction technique. COMPARISON:  None Available. FINDINGS: Brain: Age related volume loss without subjective lobar predominance. No evidence of old or acute focal infarction, mass lesion, hemorrhage, hydrocephalus or extra-axial collection. Vascular: There is atherosclerotic calcification of the major vessels at the base of the brain. Skull: Chronic benign calvarial thickening. No focal lesion. No traumatic finding. Sinuses/Orbits: Clear/normal Other: None IMPRESSION: No acute or traumatic finding. Age related volume loss. Atherosclerotic calcification of the major vessels at the base of the brain. Electronically Signed   By: Paulina Fusi M.D.   On: 12/18/2022 13:38    Microbiology: Results for orders placed or performed during the hospital encounter of 05/03/22  Surgical pcr screen     Status: Abnormal   Collection Time: 05/03/22 11:14 AM   Specimen: Nasal Mucosa; Nasal Swab  Result Value Ref Range Status   MRSA, PCR NEGATIVE NEGATIVE Final   Staphylococcus aureus POSITIVE (A) NEGATIVE Final    Comment:  (NOTE) The Xpert SA Assay (FDA approved for NASAL specimens in patients 32 years of age and older), is one component of a comprehensive surveillance program. It is not intended to diagnose infection nor to guide or monitor treatment. Performed at [facility], [address] [place], [place] [address]     Labs: CBC: Recent Labs  Lab 12/18/22 0933 12/19/22 0357  WBC 7.6 7.4  HGB 14.5 14.3  HCT 45.4 44.5  MCV 93.8 92.3  PLT 280 263   Basic Metabolic Panel: Recent Labs  Lab 12/18/22 0933 12/18/22 0934 12/19/22 0357 12/21/22 0327  NA 137  --  140 139  K 3.1*  --  3.7 3.3*  CL 103  --  109 106  CO2 23  --  22 24  GLUCOSE 162*  --  109* 154*  BUN 15  --  15 17  CREATININE 1.23*  --  1.25* 0.98  CALCIUM 9.1  --  8.7* 8.7*  MG  --  2.1  --  2.1   Liver Function Tests: No results for input(s): "AST", "ALT", "ALKPHOS", "BILITOT", "PROT", "ALBUMIN" in the last 168 hours. CBG: Recent Labs  Lab 12/20/22 0758 12/20/22 1255 12/20/22 1608 12/20/22 2104 12/21/22 0723  GLUCAP 110* 184* 200* 160* 149*    Discharge time spent: greater than 30 minutes.  Signed: Marrion Coy, MD [facility] 12/21/2022

## 2022-12-21 NOTE — Care Management Important Message (Signed)
Important Message  Patient Details  Name: Monica Ray MRN: [id number] Date of Birth: August 23, 1945   Important Message Given:  N/A - LOS <3 / Initial given by admissions     [name] 12/21/2022, 11:18 AM

## 2022-12-25 ENCOUNTER — Telehealth: Payer: Self-pay | Admitting: Neurosurgery

## 2022-12-25 NOTE — Telephone Encounter (Addendum)
I notified Irving Burton w/ [facility] of the following: She should wear the TLSO when up and out of bed. The brace for her surgery is an LSO, not a TLSO.  Irving Burton said she will discuss this with the patient and advise her to contact us with any questions.

## 2022-12-25 NOTE — Telephone Encounter (Signed)
Irving Burton from [facility] is calling. Patient was discharged from the hospital with a TLSO brace, she has not instructions. Can you just verify protocol for the brace. Will this be the same brace that the patient will need following surgery with Dr.Yarbrough?

## 2022-12-25 NOTE — Progress Notes (Unsigned)
Cardiology Office Note    Date:  12/26/2022   ID:  Monica Ray, Monica Ray [birth year], MRN [id number]  PCP:  Patrice Paradise, MD  Cardiologist:  Lorine Bears, MD  Electrophysiologist:  None   Chief Complaint: Hospital follow up  History of Present Illness:   LILAS SPAR is a 77 y.o. female with history of nonobstructive CAD, HFpEF, DM2, CKD stage III, recurrent syncope with orthostatic hypotension, HTN, HLD, prior back surgery, obesity, and sleep apnea with poor tolerance to CPAP presents for hospital follow up as outlined below.   Prior R/LHC in 12/2015 showed normal LV systolic function with mildly elevated LVEDP and mild pulm hypertension.  There was mild nonobstructive CAD.  In the setting of recurrent chest pain in 08/2018 she underwent coronary CTA that showed moderate LAD disease that was significant by CT FFR.  Calcium score was 120.  In this setting she underwent LHC 09/2018 that showed 40% proximal LAD disease and 50% ostial D1 stenosis.  EF was normal with moderately elevated LVEDP in the setting of elevated systemic blood pressure.  She had recurrent heart failure symptoms in 2023 and underwent R/LHC in 10/2021 that showed stable moderate one-vessel CAD involving the LAD with 40% proximal LAD stenosis, 30% mid LAD stenosis, and 50% D1 stenosis.  RHC showed moderately elevated right and left-sided filling pressure, mild pulmonary hypertension, and high cardiac output.  ABI in 11/2021 were normal bilaterally with some abnormal waveforms in the common femoral artery.  Duplex showed borderline disease in the iliac arteries bilaterally.  In 05/2022, she underwent cervical spine surgery and reported no significant improvement in symptoms.  Post procedures she had significant orthostatic dizziness despite holding irbesartan.  She also had gradual decline in renal function leading to the discontinuation of spironolactone and transition of Actos to [place].  She was most recently seen in  in the office in 09/2022 and was less short of breath and without symptoms of angina or cardiac decompensation.  She was started on Jardiance in 05/2022.  She experienced her first syncopal episode in 06/2022 while at her nephrologist office with lab work showing dehydration at that time.  She had another syncopal episode in 11/2022.  Most recently, she was admitted to the hospital in 12/2022 with recurrent syncope.  She reported having recently gotten up and walked to her bathroom to stand on her scale.  She looked down and felt dizzy and had blurred vision with associated syncope.  Throughout any of the episodes above she has not had symptoms of angina, palpitations, or diaphoresis.  High-sensitivity troponin negative.  Discharge summary indicates no arrhythmia on telemetry.  CT head and cervical spine without acute or traumatic finding.  Atherosclerotic calcification of the major vessels at the base of the brain noted.  CT of the lumbar spine showed mild compression deformity of T11 vertebral body suggesting acute compression fracture as well as incidental findings noted in radiology report.  Chest x-ray showed no acute cardiopulmonary process.  MRI of the brain showed no evidence of acute intracranial abnormality with mild chronic small vessel ischemic changes and left parietal scalp edema felt to possibly be posttraumatic in etiology.  Echo showed an EF of 60 to 65%, no regional wall motion abnormalities, grade 1 diastolic dysfunction, normal RV systolic function and ventricular cavity size, moderate mitral annular calcification, aortic valve sclerosis without evidence of stenosis, and an estimated right atrial pressure of 3 mmHg.  It was felt that severe orthostatic hypotension was the etiology  of her recurrent syncope.  All blood pressure medications were discontinued.  She was started on fludrocortisone and given gentle rehydration.  She followed up with endocrinology on 12/25/2022 with recommendation to  discontinue Jardiance as it was felt this was leading to ongoing orthostasis contributing to her syncope.  She has been advised to continue fludrocortisone through the end of this week and follow-up with endocrinology next week for repeat orthostatic vital signs.  She presents to our office today with her sister.  Patient indicates that she was also not eating or drinking well throughout the above timeframe.  She has had significant weight loss with Ozempic as well.  She has now increased her water intake and has an improved appetite.  No significant lower extremity swelling on fludrocortisone.  Remains off all antihypertensives with stable blood pressure.   Labs independently reviewed: 12/2022 - potassium 5.1, BUN 19, serum creatinine 1.1, albumin 3.8, AST/ALT normal, magnesium 2.1, A1c 6.8, Hgb 14.3, PLT 263, TSH normal 08/2022 - TC 128, TG 116, HDL 39, LDL 65  Past Medical History:  Diagnosis Date   Anemia    before hysterectomy   Anginal pain (HCC)    Aortic atherosclerosis (HCC)    Cervical myelopathy (HCC)    Cervical spinal stenosis    Chronic heart failure with preserved ejection fraction (HFpEF) (HCC)    a. 09/2021 Echo: EF 60-65%, no rwma, GrI DD, nl RV fxn, RVSP 47.20mmHg. Mildly dil LA; b. 10/2021 RHC: RA 15, RV 50/9, PA 48/21 (32), PCWP 21, LVEDP 27.   CKD (chronic kidney disease), stage III (HCC)    DDD (degenerative disc disease), lumbar    Depression    Diabetes mellitus, type 2 (HCC)    DM retinopathy (HCC)    Dyspnea on exertion    Edema    GERD (gastroesophageal reflux disease)    History of bilateral cataract extraction 2021   Hx of phlebitis    Hyperlipidemia    Hypertension    Lumbar stenosis    Mild allergic rhinitis    MRSA (methicillin resistant staph aureus) culture positive 03/2006   Nonobstructive CAD (coronary artery disease)    a. 12/2015 Cath: LAD min irregs, otw nl cors; b. 08/2018 Cor CTA: Mid/Distal LAD and OM1 dzs w/ abnl ctFFR; c. 09/2018 Cath: LM nl,  LAD 40p, 34m, D1 50, LCX small-nl, RCA nl, EF 55-65%; c. 10/2021 Cath: LM nl, LAD 40p/4m, lat D1 60, LCX sm nl, OM1 nl, RCA large, nl, RPDA/PAV/PL1-3 nl.   OA (osteoarthritis)    hands   Obesity    OSA on CPAP    Pulmonary HTN (HCC)    a.) R/LHC 12/08/2015: mPA 25, mPCWP 15, AO sat 93.5, CO 6.69, CI 3.04; b.) TTE 09/21/2021: RVSP 47.8; c.) R/LHC 10/23/2021: mPA 32, mPCWP 21, PA sat 73, CO 11.5    Past Surgical History:  Procedure Laterality Date   ABDOMINAL HYSTERECTOMY     ADENOIDECTOMY     ANTERIOR CERVICAL DECOMP/DISCECTOMY FUSION N/A 05/16/2022   Procedure: C4-6 ANTERIOR CERVICAL DISCECTOMY AND FUSION;  Surgeon: Venetia Night, MD;  Location: [facility];  Service: Neurosurgery;  Laterality: N/A;   APPENDECTOMY  1989   CARDIAC CATHETERIZATION Bilateral 12/08/2015   Procedure: Right/Left Heart Cath and Coronary Angiography;  Surgeon: Iran Ouch, MD;  Location: [facility];  Service: Cardiovascular;  Laterality: Bilateral;   CATARACT EXTRACTION W/PHACO Left 05/12/2019   Procedure: CATARACT EXTRACTION PHACO AND INTRAOCULAR LENS PLACEMENT (IOC) LEFT VISION BLUE;  Surgeon: Elliot Cousin,  MD;  Location: [facility];  Service: Ophthalmology;  Laterality: Left;  12.80 1:17.1   CATARACT EXTRACTION W/PHACO Right 06/11/2019   Procedure: CATARACT EXTRACTION PHACO AND INTRAOCULAR LENS PLACEMENT (IOC) RIGHT DIABETIC VISION BLUE 12.91  01:21.2;  Surgeon: Elliot Cousin, MD;  Location: [facility];  Service: Ophthalmology;  Laterality: Right;  Diabetic - insulin and oral meds   CHOLECYSTECTOMY  1989   LEFT HEART CATH AND CORONARY ANGIOGRAPHY Left 09/15/2018   Procedure: LEFT HEART CATH AND CORONARY ANGIOGRAPHY;  Surgeon: Iran Ouch, MD;  Location: [facility];  Service: Cardiovascular;  Laterality: Left;   LUMBAR LAMINECTOMY/DECOMPRESSION MICRODISCECTOMY N/A 11/25/2014   Procedure: LUMBAR LAMINECTOMY/DECOMPRESSION MICRODISCECTOMY LUMBAR TWO-THREE, LUMBAR  THREE-FOUR ;  Surgeon: Tressie Stalker, MD;  Location: [facility];  Service: Neurosurgery;  Laterality: N/A;   RIGHT/LEFT HEART CATH AND CORONARY ANGIOGRAPHY Bilateral 10/23/2021   Procedure: RIGHT/LEFT HEART CATH AND CORONARY ANGIOGRAPHY;  Surgeon: Iran Ouch, MD;  Location: [facility];  Service: Cardiovascular;  Laterality: Bilateral;   ROTATOR CUFF REPAIR Right 08/2001    Current Medications: Current Meds  Medication Sig   Cholecalciferol (VITAMIN D3) 50 MCG (2000 UT) TABS Take 2,000 Units by mouth daily.   cyanocobalamin (VITAMIN B12) 1000 MCG tablet Take 1,000 mcg by mouth daily.   DULoxetine (CYMBALTA) 60 MG capsule Take 60 mg by mouth every morning.   fludrocortisone (FLORINEF) 0.1 MG tablet Take 1 tablet (0.1 mg total) by mouth daily.   Insulin Lispro (HUMALOG IJ) Inject as directed. Sliding scale   nitroGLYCERIN (NITROSTAT) 0.4 MG SL tablet Place 1 tablet (0.4 mg total) under the tongue every 5 (five) minutes as needed for chest pain.   OZEMPIC, 2 MG/DOSE, 8 MG/3ML SOPN Inject 2 mg as directed once a week. Mondays   rosuvastatin (CRESTOR) 40 MG tablet TAKE 1 TABLET(40 MG) BY MOUTH DAILY   TOUJEO MAX SOLOSTAR 300 UNIT/ML Solostar Pen Inject 10 Units into the skin at bedtime. (Patient taking differently: Inject 10 Units into the skin at bedtime. From 10- 40 units)    Allergies:   Tegaderm ag mesh [silver], Amoxicillin, Metformin, Doxycycline, Oxycodone, Tape, Azithromycin, Clindamycin, Fluconazole, and Simvastatin   Social History   Socioeconomic History   Marital status: Married    Spouse name: Not on file   Number of children: Not on file   Years of education: Not on file   Highest education level: Not on file  Occupational History   Not on file  Tobacco Use   Smoking status: Never   Smokeless tobacco: Never  Vaping Use   Vaping status: Never Used  Substance and Sexual Activity   Alcohol use: No   Drug use: No   Sexual activity: Not on file  Other  Topics Concern   Not on file  Social History Narrative   Divorced X 23 years then remarried.   Social Determinants of Health   Financial Resource Strain: Low Risk  (12/24/2022)   Received from [facility]   Overall Financial Resource Strain (CARDIA)    Difficulty of Paying Living Expenses: Not hard at all  Food Insecurity: No Food Insecurity (12/24/2022)   Received from [facility]   Hunger Vital Sign    Worried About Running Out of Food in the Last Year: Never true    Ran Out of Food in the Last Year: Never true  Transportation Needs: No Transportation Needs (12/24/2022)   Received from [facility]  PRAPARE - Transportation    In the past 12 months, has lack of transportation kept you from medical appointments or from getting medications?: No    Lack of Transportation (Non-Medical): No  Physical Activity: Not on file  Stress: Not on file  Social Connections: Not on file     Family History:  The patient's family history includes Alcohol abuse in her father and mother; Aneurysm in her father and paternal grandmother; Breast cancer in her paternal aunt; Cancer in her father and sister; Cancer (age of onset: 62) in her mother; Diabetes in her maternal grandmother and mother.  ROS:   12-point review of systems is negative unless otherwise noted in the HPI.   EKGs/Labs/Other Studies Reviewed:    Studies reviewed were summarized above. The additional studies were reviewed today:  2D echo 12/19/2022: 1. Left ventricular ejection fraction, by estimation, is 60 to 65%. The  left ventricle has normal function. The left ventricle has no regional  wall motion abnormalities. Left ventricular diastolic parameters are  consistent with Grade I diastolic  dysfunction (impaired relaxation).   2. Right ventricular systolic function is normal. The right ventricular  size is normal. Tricuspid regurgitation signal is inadequate for  assessing  PA pressure.   3. The mitral valve is normal in structure. No evidence of mitral valve  regurgitation. No evidence of mitral stenosis. Moderate mitral annular  calcification.   4. The aortic valve is normal in structure. There is moderate  calcification of the aortic valve. Aortic valve regurgitation is not  visualized. Aortic valve sclerosis/calcification is present, without any  evidence of aortic stenosis.   5. The inferior vena cava is normal in size with greater than 50%  respiratory variability, suggesting right atrial pressure of 3 mmHg.  __________  Aortoiliac vascular ultrasound 11/14/2021: Summary:  Abdominal Aorta: Distal aorta was largely obscured by heavy calcification  and bowel gas. High velocities with triphasic pulses suggest high cardiac  output  Stenosis: +--------------------+-------------+  Location            Stenosis       +--------------------+-------------+  Right Common Iliac  >50% stenosis  +--------------------+-------------+  Left Common Iliac   >50% stenosis  +--------------------+-------------+  Right External Iliac>50% stenosis  +--------------------+-------------+  ___________  ABI 11/14/2021: Summary:  Right: Resting right ankle-brachial index is within normal range. The  right toe-brachial index is abnormal.   CFA waveforms suggestive of inflow disease.  Left: Resting left ankle-brachial index is within normal range. The left  toe-brachial index is abnormal.  ___________  [facility] 10/23/2021:   Mid LAD lesion is 30% stenosed.   Prox LAD lesion is 40% stenosed.   Lat 1st Diag lesion is 50% stenosed.   1.  Stable moderate one-vessel coronary artery disease involving the LAD.  No evidence of obstructive disease. 2.  Left ventricular angiography was not performed.  EF was normal by echo. 3.  Right heart catheterization showed moderately elevated right and left-sided filling pressures, mild pulmonary hypertension and high  cardiac output.   RA: 15 mmHg RV: 50/9 mmHg PW: 21 mmHg PA: 48/21 with a mean of 32 mmHg Pulmonary vascular resistance is less than 1 Woods unit. LVEDP: 27 mmHg PA sat was 73% with a cardiac output of 11.5.   Recommendations: Continue medical therapy for nonobstructive coronary artery disease. Pulmonary hypertension is due to chronic diastolic heart failure. I elected to add spironolactone 25 mg once daily.  Continue furosemide 20 mg once daily.  Monitor renal function closely. Consider  an SGLT2 inhibitor. __________  2D echo 09/21/2021: 1. Left ventricular ejection fraction, by estimation, is 60 to 65%. The  left ventricle has normal function. The left ventricle has no regional  wall motion abnormalities. There is mild left ventricular hypertrophy.  Left ventricular diastolic parameters  are consistent with Grade I diastolic dysfunction (impaired relaxation).   2. Right ventricular systolic function is normal. The right ventricular  size is normal. There is moderately elevated pulmonary artery systolic  pressure. The estimated right ventricular systolic pressure is 47.8 mmHg.   3. Left atrial size was mildly dilated.   4. The mitral valve is normal in structure. No evidence of mitral valve  regurgitation. No evidence of mitral stenosis.   5. The aortic valve was not well visualized. Aortic valve regurgitation  is not visualized. No aortic stenosis is present.   6. The inferior vena cava is normal in size with greater than 50%  respiratory variability, suggesting right atrial pressure of 3 mmHg.  __________  LHC 09/15/2018: The left ventricular systolic function is normal. LV end diastolic pressure is moderately elevated. The left ventricular ejection fraction is 55-65% by visual estimate. Mid LAD lesion is 30% stenosed. Lat 1st Diag lesion is 50% stenosed. Prox LAD lesion is 40% stenosed.   1.  Moderate one-vessel nonobstructive coronary artery disease. 2.  Normal LV  systolic function. 3.  Moderately elevated left ventricular end-diastolic pressure in the setting of moderately elevated systemic blood pressure.   Recommendations: Continue aggressive medical therapy. Recommend blood pressure control.  Continue furosemide. __________  Coronary CTA 08/25/2018: Calcium score: Dense calcium seen in proximal and mid RCA as well as large D1   Coronary Arteries: Right dominant with no anomalies   LM: Normal   LAD: Proximal / Mid 50-69% long calcified area of stenosis including the take off of D1 1-24% calcific disease in distal vessel   D1: Large branching vessel 1-24% calcific stenosis   Circumflex: Very small AV groove branch with no disease   RCA: Less than 25% ostial/ proximal calcific disease 25-49% calcific plaque in mid vessel   PDA: Normal   PLA: Large with multiple branches supplies most of circumflex territory normal   IMPRESSION: 1. Calcium score 929 which is 96 th percentile for age and sex involving RCA and LAD 2. CAD-RADS 3 CAD see description above with worst lesions involving the proximal and mid LAD Study will be sent for FFR CT 3.  Severe mitral annular calcification 4.  Normal aortic root 3.2 cm with calcific atherosclerosis  CT FFR: FINDINGS: FFR CT abnormal in mid LAD 0.81 and distal LAD 0.71   FFR CT abnormal in OM1 0.77   FFR CT normal in RCA   IMPRESSION: FFR CT suggests hemodynamically significant CAD in mid/distal LAD and OM1   Patient will be referred for heart catheterization __________  [facility] 12/08/2015: The left ventricular systolic function is normal. LV end diastolic pressure is mildly elevated. The left ventricular ejection fraction is 55-65% by visual estimate. Hemodynamic findings consistent with mild pulmonary hypertension.   1. Minor luminal irregularities affecting the LAD which is mildly calcified. Otherwise no evidence of obstructive disease. The left circumflex is very small but the  territory supplied by the posterolateral branches of the right coronary artery. 2. Normal LV systolic function.   3. Right heart catheterization showed only mild pulmonary hypertension with mildly elevated filling pressures.   Recommendations: Cardiac findings do not explain the patient's severe exertional dyspnea. Consider pulmonary evaluation. There might  be a component of physical deconditioning.   EKG:  EKG is not ordered today due to patient spinal brace.    Recent Labs: 12/19/2022: Hemoglobin 14.3; Platelets 263; TSH 4.211 12/21/2022: BUN 17; Creatinine, Ser 0.98; Magnesium 2.1; Potassium 3.3; Sodium 139  Recent Lipid Panel    Component Value Date/Time   CHOL 125 10/07/2013 0812   TRIG 103.0 10/07/2013 0812   HDL 33.90 (L) 10/07/2013 0812   CHOLHDL 4 10/07/2013 0812   VLDL 20.6 10/07/2013 0812   LDLCALC 71 10/07/2013 0812    PHYSICAL EXAM:    VS:  BP 124/68 (BP Location: Left Arm, Patient Position: Sitting, Cuff Size: Large)   Pulse 70   Ht 5\' 4"  (1.626 m)   Wt 196 lb 6.4 oz (89.1 kg)   SpO2 98%   BMI 33.71 kg/m   BMI: Body mass index is 33.71 kg/m.  Physical Exam Vitals reviewed.  Constitutional:      Appearance: She is well-developed.  HENT:     Head: Normocephalic and atraumatic.  Eyes:     General:        Right eye: No discharge.        Left eye: No discharge.  Neck:     Vascular: No JVD.  Cardiovascular:     Rate and Rhythm: Normal rate and regular rhythm.     Heart sounds: Normal heart sounds, S1 normal and S2 normal. Heart sounds not distant. No midsystolic click and no opening snap. No murmur heard.    No friction rub.  Pulmonary:     Effort: Pulmonary effort is normal. No respiratory distress.     Breath sounds: Normal breath sounds. No decreased breath sounds, wheezing, rhonchi or rales.  Chest:     Chest wall: No tenderness.  Abdominal:     General: There is no distension.  Musculoskeletal:     Cervical back: Normal range of motion.      Right lower leg: No edema.     Left lower leg: No edema.     Comments: Anterior and posterior thoracic spine brace noted that does limit cardiac and pulmonary auscultation.  Skin:    General: Skin is warm and dry.     Nails: There is no clubbing.  Neurological:     Mental Status: She is alert and oriented to person, place, and time.  Psychiatric:        Speech: Speech normal.        Behavior: Behavior normal.        Thought Content: Thought content normal.        Judgment: Judgment normal.     Wt Readings from Last 3 Encounters:  12/26/22 196 lb 6.4 oz (89.1 kg)  12/21/22 194 lb 10.7 oz (88.3 kg)  11/13/22 202 lb (91.6 kg)    Orthostatic vital signs: Lying: Unable to perform Sitting: 124/68, 70 bpm Standing: 104/69, 78 bpm Standing x 3 minutes: 100/63, 70  ASSESSMENT & PLAN:   Recurrent syncope: Appears to be consistent with orthostatic hypotension, possibly exacerbated by weight loss with GLP-1 therapy, diminished oral intake, dehydration, and possibly SGLT2 inhibitor usage.  Overall, symptoms are improving with adequate hydration, fludrocortisone, and now off of SGLT2 inhibitor.  She will continue with fludrocortisone through the end of this week and follow-up with endocrinology next week for follow-up orthostatic vital signs.  Would benefit from compression socks.  Placed ZIO AT.  Echo showed no significant structural abnormalities.  Continue to hold all antihypertensive therapy.  Nonobstructive CAD:  No symptoms suggestive of angina.  Continue medical therapy.  HFpEF: Euvolemic and well compensated.  Not currently on loop diuretic or SGLT2 inhibitor.  Defer rechallenge at this time.  CKD stage III: Renal function improved with hydration.  Followed by nephrology.    Disposition: F/u with Dr. Kirke Corin or an APP in 2-4 weeks.   Medication Adjustments/Labs and Tests Ordered: Current medicines are reviewed at length with the patient today.  Concerns regarding medicines are  outlined above. Medication changes, Labs and Tests ordered today are summarized above and listed in the Patient Instructions accessible in Encounters.   Signed, Eula Listen, PA-C 12/26/2022 5:42 PM     [facility] [place] [address] [place], [place] [address] [phone]

## 2022-12-26 ENCOUNTER — Encounter: Payer: Self-pay | Admitting: Physician Assistant

## 2022-12-26 ENCOUNTER — Ambulatory Visit: Payer: Medicare Other

## 2022-12-26 ENCOUNTER — Ambulatory Visit: Payer: Medicare Other | Attending: Physician Assistant | Admitting: Physician Assistant

## 2022-12-26 VITALS — BP 124/68 | HR 70 | Ht 64.0 in | Wt 196.4 lb

## 2022-12-26 DIAGNOSIS — I5032 Chronic diastolic (congestive) heart failure: Secondary | ICD-10-CM | POA: Diagnosis present

## 2022-12-26 DIAGNOSIS — N183 Chronic kidney disease, stage 3 unspecified: Secondary | ICD-10-CM | POA: Diagnosis present

## 2022-12-26 DIAGNOSIS — I2510 Atherosclerotic heart disease of native coronary artery without angina pectoris: Secondary | ICD-10-CM | POA: Diagnosis present

## 2022-12-26 DIAGNOSIS — R55 Syncope and collapse: Secondary | ICD-10-CM

## 2022-12-26 NOTE — Patient Instructions (Addendum)
Medication Instructions:  Your Physician recommend you continue on your current medication as directed.    *If you need a refill on your cardiac medications before your next appointment, please call your pharmacy*  Lab Work: None ordered at this time   Testing/Procedures: ZIO AT Long term monitor-Live Telemetry  Your physician has requested you wear a ZIO patch monitor for 14 days.  This is a single patch monitor. [employer] supplies one patch monitor per enrollment. Additional stickers are not available.  Please do not apply patch if you will be having a Nuclear Stress Test, Echocardiogram, Cardiac CT, MRI, or Chest Xray during the period you would be wearing the monitor. The patch cannot be worn during these tests. You cannot remove and re-apply the ZIO AT patch monitor.  Your ZIO patch monitor will be mailed 3 day USPS to your address on file. It may take 3-5 days to receive your monitor after you have been enrolled.  Once you have received your monitor, please review the enclosed instructions. Your monitor has already been registered assigning a specific monitor serial # to you.   Billing and Patient Assistance Program information  Meredeth Ide has been supplied with any insurance information on record for billing. [employer] offers a sliding scale Patient Assistance Program for patients without insurance, or whose insurance does not completely cover the cost of the ZIO patch monitor. You must apply for the Patient Assistance Program to qualify for the discounted rate. To apply, call [employer] at [phone], select option 4, select option 2 , ask to apply for the Patient Assistance Program, (you can request an interpreter if needed). [employer] will ask your household income and how many people are in your household. [employer] will quote your out-of-pocket cost based on this information. They will also be able to set up a 12 month interest free payment plan if needed.  Applying the monitor   Shave hair  from upper left chest.  Hold the abrader disc by orange tab. Rub the abrader in 40 strokes over left upper chest as indicated in your monitor instructions.  Clean area with 4 enclosed alcohol pads. Use all pads to ensure the area is cleaned thoroughly. Let dry.  Apply patch as indicated in monitor instructions. Patch will be placed under collarbone on left side of chest with arrow pointing upward.  Rub patch adhesive wings for 2 minutes. Remove the white label marked "1". Remove the white label marked "2". Rub patch adhesive wings for 2 additional minutes.  While looking in a mirror, press and release button in center of patch. A small green light will flash 3-4 times. This will be your only indicator that the monitor has been turned on.  Do not shower for the first 24 hours. You may shower after the first 24 hours.  Press the button if you feel a symptom. You will hear a small click. Record Date, Time and Symptom in the Patient Log.   Starting the Gateway  In your kit there is a Audiological scientist box the size of a cellphone. This is Buyer, retail. It transmits all your recorded data to [facility]. This box must always stay within 10 feet of you. Open the box and push the * button. There will be a light that blinks orange and then green a few times. When the light stops blinking, the Gateway is connected to the ZIO patch. Call [employer] at [phone] to confirm your monitor is transmitting.  Returning your monitor  Remove your patch and place  it inside the Gateway. In the lower half of the Gateway there is a white bag with prepaid postage on it. Place Gateway in bag and seal. Mail package back to [place] as soon as possible. Your physician should have your final report approximately 7 days after you have mailed back your monitor. Call [facility] Customer Care at [phone] if you have questions regarding your ZIO AT patch monitor. Call them immediately if you see an orange light blinking on  your monitor.  If your monitor falls off in less than 4 days, contact our Monitor department at [phone]. If your monitor becomes loose or falls off after 4 days call [employer] at [phone] for suggestions on securing your monitor   Follow-Up: At [facility], you and your health needs are our priority.  As part of our continuing mission to provide you with exceptional heart care, we have created designated Provider Care Teams.  These Care Teams include your primary Cardiologist (physician) and Advanced Practice Providers (APPs -  Physician Assistants and Nurse Practitioners) who all work together to provide you with the care you need, when you need it.  We recommend signing up for the patient portal called "MyChart".  Sign up information is provided on this After Visit Summary.  MyChart is used to connect with patients for Virtual Visits (Telemedicine).  Patients are able to view lab/test results, encounter notes, upcoming appointments, etc.  Non-urgent messages can be sent to your provider as well.   To learn more about what you can do with MyChart, go to [email or web address].    Your next appointment:   4 week(s)  Provider:   You may see Lorine Bears, MD or one of the following Advanced Practice Providers on your designated Care Team:   Eula Listen, [place]

## 2022-12-29 DIAGNOSIS — R55 Syncope and collapse: Secondary | ICD-10-CM

## 2023-01-08 ENCOUNTER — Encounter: Payer: Medicare Other | Admitting: Orthopedic Surgery

## 2023-01-11 ENCOUNTER — Telehealth: Payer: Self-pay

## 2023-01-11 DIAGNOSIS — S22080D Wedge compression fracture of T11-T12 vertebra, subsequent encounter for fracture with routine healing: Secondary | ICD-10-CM

## 2023-01-11 NOTE — Telephone Encounter (Signed)
Just confirming we are keeping the plan of surgery on 02/18/23 for L1-4 XLIF/PSF?  Per phone call to Mimi (PCP) on 01/08/23, she is still having issues with orthostatic hypotension.   Do you want to message Mimi to discuss if we should cancel her surgery until she is cleared? Or leave surgery on schedule for now since it is a month away?  Also, she hasn't seen [place] since she was hospitalized for syncope and T11 fracture (in mid November) and the only future appointments with [place] are the post ops scheduled for after surgery. I'm assuming she should have an appointment with one of the PA's this month for her fracture?   Saw PCP 12/24/22, goes back 03/02/22 Saw Endo 12/25/22, goes back 02/11/23 She saw Cardiology 12/08/22, goes back 01/31/23

## 2023-01-14 NOTE — Telephone Encounter (Signed)
I spoke with the patient. I scheduled her to see Stacy on 01/17/23. She would like to get the xrays on 01/16/23, so I have placed the order.   I explained that I have not canceled her surgery for 02/18/23 at this time, but that she will need to have clearance prior to surgery. She is aware that we may need to cancel her surgery until the issues with her hypotension are resolved. She is going to call Mimi's office to discuss this (and the possibility of a sooner appointment) and will let me know what Mimi says.

## 2023-01-15 ENCOUNTER — Ambulatory Visit
Admission: RE | Admit: 2023-01-15 | Discharge: 2023-01-15 | Disposition: A | Discharge status: Home / Self Care | Payer: Medicare Other | Source: Ambulatory Visit | Attending: Orthopedic Surgery | Admitting: Orthopedic Surgery

## 2023-01-15 ENCOUNTER — Ambulatory Visit
Admission: RE | Admit: 2023-01-15 | Discharge: 2023-01-15 | Disposition: A | Discharge status: Home / Self Care | Payer: Medicare Other | Attending: Orthopedic Surgery | Admitting: Orthopedic Surgery

## 2023-01-15 DIAGNOSIS — S22080D Wedge compression fracture of T11-T12 vertebra, subsequent encounter for fracture with routine healing: Secondary | ICD-10-CM

## 2023-01-15 NOTE — Progress Notes (Unsigned)
Referring Physician:  Patrice Paradise, MD [address] [facility] [place],  [place] [address]  Primary Physician:  Patrice Paradise, MD  History of Present Illness: 01/17/2023 Monica Ray has a history of DM, PVD, HTN, CAD, CHF, obesity, OSA, CKD.   She was seen for hospital consult by Dr. Myer Haff on 12/18/22 for T11 compression fracture after fall on 12/17/22. She was to wear TLSO brace when she was out of bed.   She is scheduled for XLIF L1-L4 with PSF on 02/18/23. She has f/u with cardiology on 01/31/23 and a follow up with her PCP on 02/11/23.   She complains of chronic LBP that is her baseline pain. She's not had any increased pain since above fall. She has no mid back pain. No numbness, tingling, or weakness. No leg pain.   She is being worked up by cardiology for orthostatic hypotension- did a heart monitor a few weeks ago and is waiting on results.   Bowel/Bladder Dysfunction: none  Conservative measures:  Physical therapy: denies has not participated Multimodal medical therapy including regular antiinflammatories:  meloxicam, CBD cream,tramadol   Injections:  has not received cervical epidural steroid injections 10/13/2014: Left L4-5 transforaminal ESI 09/22/2014: Left L4-5 transforaminal ESI    Past Surgery:   ACDF C4-C6 05/16/22 [place] lumbar decompression in 2016 by Dr Lovell Sheehan   The symptoms are causing a significant impact on the patient's life.   Review of Systems:  A 10 point review of systems is negative, except for the pertinent positives and negatives detailed in the HPI.  Past Medical History: Past Medical History:  Diagnosis Date   Anemia    before hysterectomy   Anginal pain (HCC)    Aortic atherosclerosis (HCC)    Cervical myelopathy (HCC)    Cervical spinal stenosis    Chronic heart failure with preserved ejection fraction (HFpEF) (HCC)    a. 09/2021 Echo: EF 60-65%, no rwma, GrI DD, nl RV fxn, RVSP 47.32mmHg.  Mildly dil LA; b. 10/2021 RHC: RA 15, RV 50/9, PA 48/21 (32), PCWP 21, LVEDP 27.   CKD (chronic kidney disease), stage III (HCC)    DDD (degenerative disc disease), lumbar    Depression    Diabetes mellitus, type 2 (HCC)    DM retinopathy (HCC)    Dyspnea on exertion    Edema    GERD (gastroesophageal reflux disease)    History of bilateral cataract extraction 2021   Hx of phlebitis    Hyperlipidemia    Hypertension    Lumbar stenosis    Mild allergic rhinitis    MRSA (methicillin resistant staph aureus) culture positive 03/2006   Nonobstructive CAD (coronary artery disease)    a. 12/2015 Cath: LAD min irregs, otw nl cors; b. 08/2018 Cor CTA: Mid/Distal LAD and OM1 dzs w/ abnl ctFFR; c. 09/2018 Cath: LM nl, LAD 40p, 59m, D1 50, LCX small-nl, RCA nl, EF 55-65%; c. 10/2021 Cath: LM nl, LAD 40p/65m, lat D1 60, LCX sm nl, OM1 nl, RCA large, nl, RPDA/PAV/PL1-3 nl.   OA (osteoarthritis)    hands   Obesity    OSA on CPAP    Pulmonary HTN (HCC)    a.) R/LHC 12/08/2015: mPA 25, mPCWP 15, AO sat 93.5, CO 6.69, CI 3.04; b.) TTE 09/21/2021: RVSP 47.8; c.) R/LHC 10/23/2021: mPA 32, mPCWP 21, PA sat 73, CO 11.5    Past Surgical History: Past Surgical History:  Procedure Laterality Date   ABDOMINAL HYSTERECTOMY  ADENOIDECTOMY     ANTERIOR CERVICAL DECOMP/DISCECTOMY FUSION N/A 05/16/2022   Procedure: C4-6 ANTERIOR CERVICAL DISCECTOMY AND FUSION;  Surgeon: Venetia Night, MD;  Location: [facility] ORS;  Service: Neurosurgery;  Laterality: N/A;   APPENDECTOMY  1989   CARDIAC CATHETERIZATION Bilateral 12/08/2015   Procedure: Right/Left Heart Cath and Coronary Angiography;  Surgeon: Iran Ouch, MD;  Location: [facility] INVASIVE CV LAB;  Service: Cardiovascular;  Laterality: Bilateral;   CATARACT EXTRACTION W/PHACO Left 05/12/2019   Procedure: CATARACT EXTRACTION PHACO AND INTRAOCULAR LENS PLACEMENT (IOC) LEFT VISION BLUE;  Surgeon: Elliot Cousin, MD;  Location: [facility];  Service:  Ophthalmology;  Laterality: Left;  12.80 1:17.1   CATARACT EXTRACTION W/PHACO Right 06/11/2019   Procedure: CATARACT EXTRACTION PHACO AND INTRAOCULAR LENS PLACEMENT (IOC) RIGHT DIABETIC VISION BLUE 12.91  01:21.2;  Surgeon: Elliot Cousin, MD;  Location: [facility];  Service: Ophthalmology;  Laterality: Right;  Diabetic - insulin and oral meds   CHOLECYSTECTOMY  1989   LEFT HEART CATH AND CORONARY ANGIOGRAPHY Left 09/15/2018   Procedure: LEFT HEART CATH AND CORONARY ANGIOGRAPHY;  Surgeon: Iran Ouch, MD;  Location: [facility] INVASIVE CV LAB;  Service: Cardiovascular;  Laterality: Left;   LUMBAR LAMINECTOMY/DECOMPRESSION MICRODISCECTOMY N/A 11/25/2014   Procedure: LUMBAR LAMINECTOMY/DECOMPRESSION MICRODISCECTOMY LUMBAR TWO-THREE, LUMBAR THREE-FOUR ;  Surgeon: Tressie Stalker, MD;  Location: [facility];  Service: Neurosurgery;  Laterality: N/A;   RIGHT/LEFT HEART CATH AND CORONARY ANGIOGRAPHY Bilateral 10/23/2021   Procedure: RIGHT/LEFT HEART CATH AND CORONARY ANGIOGRAPHY;  Surgeon: Iran Ouch, MD;  Location: [facility] INVASIVE CV LAB;  Service: Cardiovascular;  Laterality: Bilateral;   ROTATOR CUFF REPAIR Right 08/2001    Allergies: Allergies as of 01/17/2023 - Review Complete 01/17/2023  Allergen Reaction Noted   Tegaderm ag mesh [silver] Rash 12/15/2015   Amoxicillin Hives and Rash 07/14/2013   Metformin Diarrhea and Nausea And Vomiting 11/16/2014   Doxycycline Nausea Only 06/19/2022   Oxycodone Itching 09/11/2018   Tape Other (See Comments) 09/11/2018   Azithromycin Rash and Itching 11/16/2014   Clindamycin Hives and Rash 11/16/2014   Fluconazole Itching    Simvastatin Other (See Comments) 11/16/2014    Medications: Outpatient Encounter Medications as of 01/17/2023  Medication Sig   acetaminophen (TYLENOL) 650 MG CR tablet Take by mouth.   alendronate (FOSAMAX) 70 MG tablet Take 70 mg by mouth once a week.   Cholecalciferol (VITAMIN D3) 50 MCG (2000 UT) TABS Take  2,000 Units by mouth daily.   cyanocobalamin (VITAMIN B12) 1000 MCG tablet Take 1,000 mcg by mouth daily.   DULoxetine (CYMBALTA) 60 MG capsule Take 60 mg by mouth every morning.   Insulin Lispro (HUMALOG IJ) Inject as directed. Sliding scale   nitroGLYCERIN (NITROSTAT) 0.4 MG SL tablet Place 1 tablet (0.4 mg total) under the tongue every 5 (five) minutes as needed for chest pain.   OZEMPIC, 2 MG/DOSE, 8 MG/3ML SOPN Inject 2 mg as directed once a week. Mondays   TOUJEO MAX SOLOSTAR 300 UNIT/ML Solostar Pen Inject 10 Units into the skin at bedtime. (Patient taking differently: Inject 10 Units into the skin at bedtime. From 10- 40 units)   triamcinolone cream (KENALOG) 0.1 % Apply topically 2 (two) times daily.   TYRVAYA 0.03 MG/ACT SOLN SMARTSIG:1 Spray(s) Both Nares   [DISCONTINUED] fludrocortisone (FLORINEF) 0.1 MG tablet Take 1 tablet (0.1 mg total) by mouth daily.   [DISCONTINUED] JARDIANCE 10 MG TABS tablet TAKE 1 TABLET(10 MG) BY MOUTH DAILY BEFORE BREAKFAST (Patient not taking: Reported on 12/26/2022)   [  DISCONTINUED] potassium chloride SA (KLOR-CON M) 20 MEQ tablet Take 1 tablet (20 mEq total) by mouth daily for 4 days.   [DISCONTINUED] rosuvastatin (CRESTOR) 40 MG tablet TAKE 1 TABLET(40 MG) BY MOUTH DAILY   No facility-administered encounter medications on file as of 01/17/2023.    Social History: Social History   Tobacco Use   Smoking status: Never   Smokeless tobacco: Never  Vaping Use   Vaping status: Never Used  Substance Use Topics   Alcohol use: No   Drug use: No    Family Medical History: Family History  Problem Relation Age of Onset   Cancer Mother 107       lung   Diabetes Mother    Alcohol abuse Mother    Alcohol abuse Father    Cancer Father        throat   Aneurysm Father    Diabetes Maternal Grandmother    Aneurysm Paternal Grandmother    Breast cancer Paternal Aunt        29's   Cancer Sister     Physical Examination: Vitals:   01/17/23 0925   BP: 126/70      Awake, alert, oriented to person, place, and time.  Speech is clear and fluent. Fund of knowledge is appropriate.   Cranial Nerves: Pupils equal round and reactive to light.  Facial tone is symmetric.    She has diffuse lower lumbar tenderness. No specific thoracic tenderness.   No abnormal lesions on exposed skin.   Strength: Side Biceps Triceps Deltoid Interossei Grip Wrist Ext. Wrist Flex.  R 5 5 5 5 5 5 5   L 5 5 5 5 5 5 5    Side Iliopsoas Quads Hamstring PF DF EHL  R 5 5 5 5 5 5   L 5 5 5 5 5 5    Reflexes are 2+ and symmetric at the biceps, brachioradialis, patella and achilles.   Hoffman's is absent on right, positive on left.  Clonus is not present.   Bilateral upper and lower extremity sensation is intact to light touch.     Gait is normal.     Medical Decision Making  Imaging: Lumbar xrays dated 01/15/23:  T11 fracture that has progressed since below CT scan.   Xray report not available for above xrays.    CT of lumbar spine dated 12/18/22:  FINDINGS: Segmentation: Transitional lumbosacral anatomy. To maintain consistency with prior reporting, there is left hemi sacralization of the L5 vertebral segment and absent ribs at T12.   Alignment: Unchanged trace retrolisthesis of L1 on L2.   Vertebrae: Mild compression deformity of the T11 vertebral body with cortical disruption laterally (coronal image 37 series 7), suggesting acute compression fracture. Severe degenerative endplate changes at L1-2. Postoperative changes of prior L3-4 laminectomy and L3-S1 posterior spinal fusion.   Paraspinal and other soft tissues: Atherosclerotic calcifications of the abdominal aorta and its branches.   Disc levels: Unchanged multilevel lumbar spondylosis, worst at L1-2, where there is severe spinal canal stenosis and severe bilateral neural foraminal narrowing.   IMPRESSION: 1. Mild compression deformity of the T11 vertebral body with cortical  disruption laterally, suggesting acute compression fracture. 2. Unchanged multilevel lumbar spondylosis, worst at L1-2, where there is severe spinal canal stenosis and severe bilateral neural foraminal narrowing. 3. Postoperative changes of prior L3-4 laminectomy and L3-S1 posterior spinal fusion.   Aortic Atherosclerosis (ICD10-I70.0).     Electronically Signed   By: Orvan Falconer M.D.   On: 12/18/2022 14:08  I have personally reviewed the images and agree with the above interpretation.  Assessment and Plan: Monica Ray has known T11 compression fracture s/p fall on 12/17/22.   She continues with chronic LBP that is her baseline pain. She's not had any increased pain since above fall. She has no mid back pain. No numbness, tingling, or weakness. No leg pain.   Xrays from today show further progression of T11 fracture in comparison to previous lumbar CT scan.   She is scheduled for XLIF L1-L4 with PSF on 02/18/23.   Treatment options discussed with patient and following plan made:   - Reviewed with Dr. Myer Haff. Due to progression of T11 fracture, he recommends CT of thoracic spine. He also recommends cancelling her surgery for now.  - She is to wear TLSO brace when up and walking. No bending, twisting, or lifting. She has TLSO brace at home.  - She is to follow up with cardiology and PCP for clearance for surgery.  - Will see Dr. Myer Haff after her CT to review results and discuss further surgery options.   I spent a total of 30 minutes in face-to-face and non-face-to-face activities related to this patient's care today including review of outside records, review of imaging, review of symptoms, physical exam, discussion of differential diagnosis, discussion of treatment options, and documentation.   Drake Leach PA-C Dept. of Neurosurgery

## 2023-01-17 ENCOUNTER — Ambulatory Visit: Payer: Medicare Other | Admitting: Orthopedic Surgery

## 2023-01-17 ENCOUNTER — Telehealth: Payer: Self-pay

## 2023-01-17 ENCOUNTER — Encounter: Payer: Self-pay | Admitting: Orthopedic Surgery

## 2023-01-17 VITALS — BP 126/70 | Ht 64.0 in | Wt 196.0 lb

## 2023-01-17 DIAGNOSIS — S22080D Wedge compression fracture of T11-T12 vertebra, subsequent encounter for fracture with routine healing: Secondary | ICD-10-CM

## 2023-01-17 DIAGNOSIS — W19XXXD Unspecified fall, subsequent encounter: Secondary | ICD-10-CM

## 2023-01-17 NOTE — Telephone Encounter (Signed)
Monica Ray was seen in office today. Her xray shows worsening of her T11 fracture. She is scheduled to see cardiology on 01/31/23 and her PCP on 02/11/23 regarding orthostatic hypotension and surgical clearance. At this time, we will cancel surgery on 02/18/23. She will get a CT scan in the next couple of weeks and see Dr Myer Haff on 02/19/23 to discuss next steps. Monica Ray is aware of this plan.

## 2023-01-31 ENCOUNTER — Ambulatory Visit: Payer: Medicare Other | Attending: Physician Assistant | Admitting: Emergency Medicine

## 2023-01-31 ENCOUNTER — Encounter: Payer: Self-pay | Admitting: Physician Assistant

## 2023-01-31 VITALS — BP 120/72 | HR 79 | Ht 64.0 in | Wt 191.4 lb

## 2023-01-31 DIAGNOSIS — E785 Hyperlipidemia, unspecified: Secondary | ICD-10-CM | POA: Diagnosis present

## 2023-01-31 DIAGNOSIS — I951 Orthostatic hypotension: Secondary | ICD-10-CM | POA: Insufficient documentation

## 2023-01-31 DIAGNOSIS — I2510 Atherosclerotic heart disease of native coronary artery without angina pectoris: Secondary | ICD-10-CM | POA: Insufficient documentation

## 2023-01-31 DIAGNOSIS — R55 Syncope and collapse: Secondary | ICD-10-CM | POA: Diagnosis present

## 2023-01-31 DIAGNOSIS — I5032 Chronic diastolic (congestive) heart failure: Secondary | ICD-10-CM | POA: Insufficient documentation

## 2023-01-31 NOTE — Progress Notes (Signed)
Of Cardiology Office Note:    Date:  01/31/2023  ID:  Tawan, Zaccheo [birth year], MRN [id number] PCP: Patrice Paradise, MD  [facility] Providers Cardiologist:  Lorine Bears, MD       Patient Profile:      Monica Ray is a 77 year old female with visit pertinent history of nonobstructive CAD, HFpEF, T2DM, CKD stage III, recurrent syncope with orthostatic hypotension, HTN, HLD, obesity, sleep apnea with poor tolerance to CPAP.  Prior [facility] in November 2017 showed normal LV systolic function with mildly elevated LVEDP and mild pulmonary HTN.  There was mild nonobstructive CAD.  In the setting of recurrent chest pain in July 2020 she underwent coronary CTA that showed moderate LAD disease with that was significant by CT FFR.  Calcium score was 120.  She underwent LHC in August 2020 that showed 40% proximal LAD disease and 50% ostial D1 stenosis.  EF was normal with moderately elevated LVEDP in the setting of elevated systolic blood pressure.  She had recurrent heart failure symptoms in 2023 and underwent R/LHC in September 2023 that showed stable moderate one-vessel CAD involving the LAD with 40% proximal LAD stenosis, 30% mid LAD stenosis, 50% D1 stenosis.  RHC showed moderately elevated right and left sided filling pressure, mild pulmonary hypertension, high cardiac output.   In April 2024 she underwent cervical spine surgery and reported no significant treatment in her symptoms.  Postprocedure she had significant orthostatic dizziness despite holding irbesartan.  She also had gradual decline in renal function leading to discontinuation of spironolactone and transition of Actos to [place].  She had a syncopal episode and May 2024 at her nephrology office and another similar episode in October 2024.  She was recently admitted to the hospital in November 2024 with recurrent syncope.  She reported she had gotten up to walk to her bathroom just on her scale and looked down and felt  dizzy and had associated blurred vision with associated syncope.  Echocardiogram 12/2022 showed EF 60-65%, no RWMA, grade 1 DD, RV SF normal, moderate mitral annular calcification, aortic valve sclerosis.  It was felt that severe orthostatic hypotension was the etiology of her recurrent syncope and all her blood pressure medications were discontinued.  She followed up with endocrinology on 12/25/2022 with recommendation to discontinue Jardiance as it was felt this was leading to ongoing orthostasis contributing to her syncope.  Last seen in office by Eula Listen, PA and at that time she was doing well.  It was noted that her recurrent syncope does appear to be consistent with orthostatic hypotension possibly exacerbated by weight loss with GLP-1 therapy, diminished oral intake, dehydration, SGLT2 inhibitor usage.  Overall her symptoms were improving with adequate hydration, fludrocortisone.  ZIO monitor was placed and completed however results have not been published at this time.      History of Present Illness:  Monica Ray is a 77 y.o. female who returns for 1 month follow-up.  She presents to clinic today with her sister.  Patient is since the last time she was in clinic she has been doing well overall.  She notes that she has had 1 near syncopal episode since her last clinic visit.  She was bending over to put food into the oven on Thanksgiving day and became dizzy then proceeded to try and sit down and missed the chair and fell on her bottom.  She denied any major injury or head injury from event.  She noted that she did  not lose consciousness.  She does not take her blood pressure during her syncopal events.  In office today her sitting blood pressure was 156/70 and her standing blood pressure was 120/72.  She has tried to improve her hydration status and notes that she is probably dehydrated as she does not drink frequently.  She notes that she mostly sits down today and gets no exercise due to her  going lower back pain.  She notes that her back surgery that was scheduled for January 2025 has had to been canceled due to her workup for orthostatic hypotension.  She notes that her PCP has decreased her insulin dosage and she feels like her blood sugars have all been a little bit more elevated as of recent.  She denies any chest pain, shortness of breath, DOE, palpitations, tachycardia, irregular heartbeat.       Review of Systems  Constitutional: Negative for weight gain and weight loss.  Cardiovascular:  Positive for near-syncope. Negative for chest pain, claudication, cyanosis, dyspnea on exertion, irregular heartbeat, leg swelling, orthopnea, palpitations, paroxysmal nocturnal dyspnea and syncope.  Respiratory:  Negative for cough, hemoptysis and shortness of breath.   Gastrointestinal:  Negative for abdominal pain, hematochezia and melena.  Genitourinary:  Negative for hematuria.  Neurological:  Positive for dizziness and light-headedness.     See HPI    Studies Reviewed:       Echocardiogram 12/19/2022 1. Left ventricular ejection fraction, by estimation, is 60 to 65%. The  left ventricle has normal function. The left ventricle has no regional  wall motion abnormalities. Left ventricular diastolic parameters are  consistent with Grade I diastolic  dysfunction (impaired relaxation).   2. Right ventricular systolic function is normal. The right ventricular  size is normal. Tricuspid regurgitation signal is inadequate for assessing  PA pressure.   3. The mitral valve is normal in structure. No evidence of mitral valve  regurgitation. No evidence of mitral stenosis. Moderate mitral annular  calcification.   4. The aortic valve is normal in structure. There is moderate  calcification of the aortic valve. Aortic valve regurgitation is not  visualized. Aortic valve sclerosis/calcification is present, without any  evidence of aortic stenosis.   5. The inferior vena cava is normal in  size with greater than 50%  respiratory variability, suggesting right atrial pressure of 3 mmHg.   Heart Catheterization 11/14/2021   Mid LAD lesion is 30% stenosed.   Prox LAD lesion is 40% stenosed.   Lat 1st Diag lesion is 50% stenosed.   1.  Stable moderate one-vessel coronary artery disease involving the LAD.  No evidence of obstructive disease. 2.  Left ventricular angiography was not performed.  EF was normal by echo. 3.  Right heart catheterization showed moderately elevated right and left-sided filling pressures, mild pulmonary hypertension and high cardiac output. Diagnostic Dominance: Right   Risk Assessment/Calculations:             Physical Exam:   VS:  BP 120/72 Comment: standing bp  Pulse 79   Ht 5\' 4"  (1.626 m)   Wt 191 lb 6.4 oz (86.8 kg)   SpO2 99%   BMI 32.85 kg/m    Wt Readings from Last 3 Encounters:  01/31/23 191 lb 6.4 oz (86.8 kg)  01/17/23 196 lb (88.9 kg)  12/26/22 196 lb 6.4 oz (89.1 kg)    Constitutional:      Appearance: Normal and healthy appearance.  HENT:     Head: Normocephalic.  Neck:  Vascular: JVD normal.  Pulmonary:     Effort: Pulmonary effort is normal.     Breath sounds: Normal breath sounds.  Chest:     Chest wall: Not tender to palpatation.  Cardiovascular:     PMI at left midclavicular line. Normal rate. Regular rhythm. Normal S1. Normal S2.      Murmurs: There is no murmur.     No gallop.  No click. No rub.  Pulses:    Intact distal pulses.  Edema:    Peripheral edema absent.  Musculoskeletal: Normal range of motion.     Cervical back: Normal range of motion and neck supple. Skin:    General: Skin is warm and dry.  Neurological:     General: No focal deficit present.     Mental Status: Alert, oriented to person, place, and time and oriented to person, place and time.  Psychiatric:        Attention and Perception: Attention and perception normal.        Mood and Affect: Mood normal.        Behavior: Behavior is  cooperative.        Thought Content: Thought content normal.        Assessment and Plan:  Orthostatic hypotension / Syncope -Her recurrent syncope likely in the setting of orthostatic hypotension.  Sitting BP 156/78 and standing BP 120/72.  No episodes of LOC/syncope since last clinic visit. Her most recent event was near-syncopal episode in relation to movement with her bending forward (she was on zio patch at this time).  Overall her symptoms have been improving with adequate hydration and discontinuance of her SGLT2i.  She would benefit greatly from compression socks or abdominal brace, encouraged her to use these.  Her most recent echocardiogram 12/2022 shows LVEF 60 to 65% with no valvular abnormalities.  Her ZIO monitor has been completed and is currently pending, we will reach out regarding results of this.  For now we will continue to hold all antihypertensive therapy and allow for some permissive elevated blood pressures.  She will move she will get from a sitting to standing position slowly and with her walker.  Nonobstructive CAD -LHC 11/14/2021 with moderate one-vessel CAD involving the LAD.  Stable with no anginal symptoms, no indication for ischemic evaluation.  Continue current medical therapy.  HFpEF -Euvolemic and well compensated on exam.  She is not currently requiring loop diuretic or SGLT2 inhibitor.  She does not very mild pedal edema at night after being on her feet most of the day, swelling always improves when legs are elevated.  Encouraged lower extremity stockings.  Continue current medical therapy  Hyperlipidemia LDL 08/2022 is 65.  Under good control.  Continue current medical therapy.  Encouraged heart healthy dieting and foods high in fiber.  Increase vegetables and fruit in your diet.  Decrease processed foods.              Dispo:  Return in about 6 months (around 08/01/2023).  Signed, Denyce Robert, NP

## 2023-01-31 NOTE — Patient Instructions (Signed)
Medication Instructions:  Your Physician recommend you continue on your current medication as directed.    *If you need a refill on your cardiac medications before your next appointment, please call your pharmacy*   Lab Work: None ordered at this time    Follow-Up: At [facility], you and your health needs are our priority.  As part of our continuing mission to provide you with exceptional heart care, we have created designated Provider Care Teams.  These Care Teams include your primary Cardiologist (physician) and Advanced Practice Providers (APPs -  Physician Assistants and Nurse Practitioners) who all work together to provide you with the care you need, when you need it.  We recommend signing up for the patient portal called "MyChart".  Sign up information is provided on this After Visit Summary.  MyChart is used to connect with patients for Virtual Visits (Telemedicine).  Patients are able to view lab/test results, encounter notes, upcoming appointments, etc.  Non-urgent messages can be sent to your provider as well.   To learn more about what you can do with MyChart, go to [email or web address].    Your next appointment:   6 month(s)  Provider:   You may see Lorine Bears, MD

## 2023-02-12 ENCOUNTER — Encounter: Payer: Medicare Other | Admitting: Neurosurgery

## 2023-02-12 ENCOUNTER — Ambulatory Visit
Admission: RE | Admit: 2023-02-12 | Discharge: 2023-02-12 | Disposition: A | Discharge status: Home / Self Care | Payer: Medicare Other | Source: Ambulatory Visit | Attending: Orthopedic Surgery | Admitting: Orthopedic Surgery

## 2023-02-12 DIAGNOSIS — S22080D Wedge compression fracture of T11-T12 vertebra, subsequent encounter for fracture with routine healing: Secondary | ICD-10-CM | POA: Diagnosis present

## 2023-02-18 ENCOUNTER — Inpatient Hospital Stay: Admission: RE | Admit: 2023-02-18 | Payer: Medicare Other | Source: Home / Self Care | Admitting: Neurosurgery

## 2023-02-18 ENCOUNTER — Encounter: Admission: RE | Payer: Self-pay | Source: Home / Self Care

## 2023-02-18 SURGERY — ANTERIOR LATERAL LUMBAR FUSION WITH PERCUTANEOUS SCREW 3 LEVEL
Anesthesia: General

## 2023-02-19 ENCOUNTER — Ambulatory Visit (INDEPENDENT_AMBULATORY_CARE_PROVIDER_SITE_OTHER): Payer: Medicare Other | Admitting: Neurosurgery

## 2023-02-19 ENCOUNTER — Encounter: Payer: Self-pay | Admitting: Neurosurgery

## 2023-02-19 VITALS — BP 122/74 | Ht 64.0 in | Wt 191.0 lb

## 2023-02-19 DIAGNOSIS — W19XXXD Unspecified fall, subsequent encounter: Secondary | ICD-10-CM

## 2023-02-19 DIAGNOSIS — M419 Scoliosis, unspecified: Secondary | ICD-10-CM | POA: Diagnosis not present

## 2023-02-19 DIAGNOSIS — S22080D Wedge compression fracture of T11-T12 vertebra, subsequent encounter for fracture with routine healing: Secondary | ICD-10-CM

## 2023-02-19 DIAGNOSIS — G959 Disease of spinal cord, unspecified: Secondary | ICD-10-CM

## 2023-02-19 DIAGNOSIS — M4316 Spondylolisthesis, lumbar region: Secondary | ICD-10-CM

## 2023-02-19 NOTE — Progress Notes (Signed)
 Referring Physician:  Marikay Eva POUR, MD [id number] [facility] [place],  [place] [address]  Primary Physician:  Marikay Eva POUR, MD   DOS: 05/16/2022 (C4-6 ACDF)  History of Present Illness: 02/19/2023 Ms. Akey presents with continued low back pain.  Her pain is improved compared to prior to her thoracic fracture.  Her thoracic fracture is improving.  She has been compliant with her brace.   11/13/2022 Ms. Uliano is doing very well from her neck surgery.  She has lost 50 pounds over the past 6 months.  She continues to have life limiting back pain.   06/26/2022 She is doing well from her neck surgery.  She is back at work.  Her back continues to bother her.  She is having dizziness.  05/31/2022 Ms. Reata Petrov is status post anterior cervical disc fusion.  Her strength has returned.  She continues to have back issues.  Her swallowing has returned to normal.  She still having some voice related changes..  04/19/2022 Ms. Lasonya Hubner is here today with a chief complaint of back and leg pain.  She has been having pain for approximately 2 years that are now limiting her ability to walk.  She has significant back and posterior buttock pain that occurs when she stands and tries to walk.  Standing is also problematic for her.  Her pain improves when she sits down.  Most of her pain is between her hip and her knees.  This is now limiting her day-to-day activities by substantial amount.  She also reports some problems with shortness of breath that has been worked up to identify whether there is a proximate cause.  She has some heart failure that is being managed currently.  She does report some issues with balance.  She she has some hand numbness that has been impacting her.  Bowel/Bladder Dysfunction: none  Conservative measures:  Physical therapy: denies has not participated Multimodal medical therapy including regular antiinflammatories:  meloxicam , CBD  cream,tramadol  Injections:  has not received cervical epidural steroid injections 10/13/2014: Left L4-5 transforaminal ESI 09/22/2014: Left L4-5 transforaminal ESI    Past Surgery:  lumbar decompression in 2016 by Dr Mavis Ronal LELON Rosalind has symptoms of cervical myelopathy.  The symptoms are causing a significant impact on the patient's life.   I have utilized the care everywhere function in epic to review the outside records available from external health systems.  Review of Systems:  A 10 point review of systems is negative, except for the pertinent positives and negatives detailed in the HPI.  Past Medical History: Past Medical History:  Diagnosis Date   Anemia    before hysterectomy   Anginal pain (HCC)    Aortic atherosclerosis (HCC)    Cervical myelopathy (HCC)    Cervical spinal stenosis    Chronic heart failure with preserved ejection fraction (HFpEF) (HCC)    a. 09/2021 Echo: EF 60-65%, no rwma, GrI DD, nl RV fxn, RVSP 47.70mmHg. Mildly dil LA; b. 10/2021 RHC: RA 15, RV 50/9, PA 48/21 (32), PCWP 21, LVEDP 27.   CKD (chronic kidney disease), stage III (HCC)    DDD (degenerative disc disease), lumbar    Depression    Diabetes mellitus, type 2 (HCC)    DM retinopathy (HCC)    Dyspnea on exertion    Edema    GERD (gastroesophageal reflux disease)    History of bilateral cataract extraction 2021   Hx of phlebitis  Hyperlipidemia    Hypertension    Lumbar stenosis    Mild allergic rhinitis    MRSA (methicillin resistant staph aureus) culture positive 03/2006   Nonobstructive CAD (coronary artery disease)    a. 12/2015 Cath: LAD min irregs, otw nl cors; b. 08/2018 Cor CTA: Mid/Distal LAD and OM1 dzs w/ abnl ctFFR; c. 09/2018 Cath: LM nl, LAD 40p, 1m, D1 50, LCX small-nl, RCA nl, EF 55-65%; c. 10/2021 Cath: LM nl, LAD 40p/34m, lat D1 60, LCX sm nl, OM1 nl, RCA large, nl, RPDA/PAV/PL1-3 nl.   OA (osteoarthritis)    hands   Obesity    OSA on CPAP    Pulmonary HTN  (HCC)    a.) R/LHC 12/08/2015: mPA 25, mPCWP 15, AO sat 93.5, CO 6.69, CI 3.04; b.) TTE 09/21/2021: RVSP 47.8; c.) R/LHC 10/23/2021: mPA 32, mPCWP 21, PA sat 73, CO 11.5    Past Surgical History: Past Surgical History:  Procedure Laterality Date   ABDOMINAL HYSTERECTOMY     ADENOIDECTOMY     ANTERIOR CERVICAL DECOMP/DISCECTOMY FUSION N/A 05/16/2022   Procedure: C4-6 ANTERIOR CERVICAL DISCECTOMY AND FUSION;  Surgeon: Clois Fret, MD;  Location: [facility] ORS;  Service: Neurosurgery;  Laterality: N/A;   APPENDECTOMY  1989   CARDIAC CATHETERIZATION Bilateral 12/08/2015   Procedure: Right/Left Heart Cath and Coronary Angiography;  Surgeon: Deatrice DELENA Cage, MD;  Location: [facility] IN[facility];  Service: Cardiovascular;  Laterality: Bilateral;   CATARACT EXTRACTION W/PHACO Left 05/12/2019   Procedure: CATARACT EXTRACTION PHACO AND INTRAOCULAR LENS PLACEMENT (IOC) LEFT VISION BLUE;  Surgeon: Ferol Rogue, MD;  Location: [facility];  Service: Ophthalmology;  Laterality: Left;  12.80 1:17.1   CATARACT EXTRACTION W/PHACO Right 06/11/2019   Procedure: CATARACT EXTRACTION PHACO AND INTRAOCULAR LENS PLACEMENT (IOC) RIGHT DIABETIC VISION BLUE 12.91  01:21.2;  Surgeon: Ferol Rogue, MD;  Location: [facility];  Service: Ophthalmology;  Laterality: Right;  Diabetic - insulin  and oral meds   CHOLECYSTECTOMY  1989   LEFT HEART CATH AND CORONARY ANGIOGRAPHY Left 09/15/2018   Procedure: LEFT HEART CATH AND CORONARY ANGIOGRAPHY;  Surgeon: Cage Deatrice DELENA, MD;  Location: [facility] IN[facility] CV [facility];  Service: Cardiovascular;  Laterality: Left;   LUMBAR LAMINECTOMY/DECOMPRESSION MICRODISCECTOMY N/A 11/25/2014   Procedure: LUMBAR LAMINECTOMY/DECOMPRESSION MICRODISCECTOMY LUMBAR TWO-THREE, LUMBAR THREE-FOUR ;  Surgeon: Reyes Budge, MD;  Location: [facility];  Service: Neurosurgery;  Laterality: N/A;   RIGHT/LEFT HEART CATH AND CORONARY ANGIOGRAPHY Bilateral 10/23/2021   Procedure: RIGHT/LEFT  HEART CATH AND CORONARY ANGIOGRAPHY;  Surgeon: Cage Deatrice DELENA, MD;  Location: [facility];  Service: Cardiovascular;  Laterality: Bilateral;   ROTATOR CUFF REPAIR Right 08/2001    Allergies: Allergies as of 02/19/2023 - Review Complete 02/19/2023  Allergen Reaction Noted   Tegaderm ag mesh [silver] Rash 12/15/2015   Amoxicillin Hives and Rash 07/14/2013   Metformin Diarrhea and Nausea And Vomiting 11/16/2014   Doxycycline Nausea Only 06/19/2022   Oxycodone Itching 09/11/2018   Tape Other (See Comments) 09/11/2018   Azithromycin Rash and Itching 11/16/2014   Clindamycin Hives and Rash 11/16/2014   Fluconazole Itching    Simvastatin Other (See Comments) 11/16/2014    Medications: Current Meds  Medication Sig   acetaminophen  (TYLENOL ) 650 MG CR tablet Take by mouth.   alendronate (FOSAMAX) 70 MG tablet Take 70 mg by mouth once a week.   Cholecalciferol  (VITAMIN D3) 50 MCG (2000 UT) TABS Take 2,000 Units by mouth daily.   cyanocobalamin  (VITAMIN B12) 1000 MCG tablet Take 1,000  mcg by mouth daily.   furosemide  (LASIX ) 40 MG tablet Take 20 mg by mouth daily.   Insulin  Lispro (HUMALOG IJ) Inject as directed. Sliding scale   nitroGLYCERIN  (NITROSTAT ) 0.4 MG SL tablet Place 1 tablet (0.4 mg total) under the tongue every 5 (five) minutes as needed for chest pain.   TOUJEO  MAX SOLOSTAR 300 UNIT/ML Solostar Pen Inject 10 Units into the skin at bedtime. (Patient taking differently: Inject 10 Units into the skin at bedtime. From 20- 30 units)   triamcinolone cream (KENALOG) 0.1 % Apply topically 2 (two) times daily.   TYRVAYA 0.03 MG/ACT SOLN SMARTSIG:1 Spray(s) Both Nares    Social History: Social History   Tobacco Use   Smoking status: Never   Smokeless tobacco: Never  Vaping Use   Vaping status: Never Used  Substance Use Topics   Alcohol use: No   Drug use: No    Family Medical History: Family History  Problem Relation Age of Onset   Cancer Mother 67       lung    Diabetes Mother    Alcohol abuse Mother    Alcohol abuse Father    Cancer Father        throat   Aneurysm Father    Diabetes Maternal Grandmother    Aneurysm Paternal Grandmother    Breast cancer Paternal Aunt        42's   Cancer Sister     Physical Examination: Vitals:   02/19/23 0859  BP: 122/74     General: Patient is well developed, well nourished, calm, collected, and in no apparent distress. Attention to examination is appropriate.  Neck:   Supple.  Full range of motion.  Respiratory: Patient is breathing without any difficulty.   NEUROLOGICAL:     Awake, alert, oriented to person, place, and time.  Speech is clear and fluent.   Cranial Nerves: Pupils equal round and reactive to light.  Facial tone is symmetric.  Facial sensation is symmetric. Shoulder shrug is symmetric. Tongue protrusion is midline.  There is no pronator drift.  ROM of spine: full.    Strength: Side Biceps Triceps Deltoid Interossei Grip Wrist Ext. Wrist Flex.  R 5 5 5 5 5 5 5   L 5 5 5 5 5 5 5    Side Iliopsoas Quads Hamstring PF DF EHL  R 5 5 5 5 5 5   L 5 5 5 5 5 5       Medical Decision Making  Imaging: MRI CL spine 03/19/2022 IMPRESSION: 1. Progressive multilevel cervical spondylosis, most pronounced at the C4-5 and C5-6 levels where there is severe canal stenosis. Mild cord compression at C4-5. 2. Severe right and moderate left foraminal stenosis at C5-6.     Electronically Signed   By: Mabel Converse D.O.   On: 03/20/2022 09:54  IMPRESSION: 1. Transitional anatomy, as above. 2. Multilevel degenerative changes of the lumbar spine, progressed at L1-L2 where there is severe canal stenosis with severe left and moderate right foraminal stenosis. 3. Severe right and moderate-severe left foraminal stenosis at L2-L3. 4. Moderate-severe bilateral foraminal stenosis at L3-L4.     Electronically Signed   By: Mabel Converse D.O.   On: 03/20/2022 10:06  CT T spine  02/12/2023 IMPRESSION: 1. Counting down from C2 based on prior cervical spine CT 12/18/2022, the level of the compression fracture is actually T12 and the patient has 6 non-rib-bearing lumbar type segments. 2. Comminuted T12 vertebral body compression fracture shows evidence of interval attempted  healing. There is 3-4 mm posteroinferior cortical retropulsion, 50% anterior, 50% central, 30% posterior height loss. The vertebral height is similar to the 01/15/2023 x-ray study. 3. Osteopenia, scoliosis and degenerative change. See above for complete details. 4. No other recent fractures are seen. 5. Aortic and coronary artery atherosclerosis. 6. Mild infrahilar bronchiectasis and scattered lower lobe linear scarring.   Aortic Atherosclerosis (ICD10-I70.0).     Electronically Signed   By: Francis Quam M.D.   On: 02/12/2023 21:10  I have personally reviewed the images and agree with the above interpretation.  Assessment and Plan: Ms. Stallings is a pleasant 78 y.o. female with cervical myelopathy s/p C4-6 anterior cervical discectomy and fusion.  She is doing well from the surgery.    Her back pain is a more complex issue.  She is now dealing with a T12 fracture that has partially healed.  We previously had planned for an L1-4 lateral interbody fusion which was really L2-5 based on the new information of this thoracic CT scan.  She has 6 lumbar vertebrae.  I am somewhat concerned that her fracture is not completely healed.  That would necessitate a larger surgery than we previously discussed.  I am not sure this is the appropriate step for her, so have suggested she think about spinal cord stimulation.  We will get a thoracic MRI scan in 1 to 2 months to make a final decision.  I spent a total of 20 minutes in this patient's care today. This time was spent reviewing pertinent records including imaging studies, obtaining and confirming history, performing a directed evaluation, formulating  and discussing my recommendations, and documenting the visit within the medical record.       Joseth Weigel K. Clois MD, [facility] Neurosurgery

## 2023-03-05 ENCOUNTER — Encounter: Payer: Medicare Other | Admitting: Orthopedic Surgery

## 2023-03-12 ENCOUNTER — Encounter: Payer: Medicare Other | Admitting: Orthopedic Surgery

## 2023-04-02 ENCOUNTER — Encounter: Payer: Medicare Other | Admitting: Neurosurgery

## 2023-04-08 ENCOUNTER — Ambulatory Visit
Admission: RE | Admit: 2023-04-08 | Discharge: 2023-04-08 | Disposition: A | Discharge status: Home / Self Care | Payer: Medicare Other | Source: Ambulatory Visit | Attending: Neurosurgery | Admitting: Neurosurgery

## 2023-04-08 DIAGNOSIS — S22080D Wedge compression fracture of T11-T12 vertebra, subsequent encounter for fracture with routine healing: Secondary | ICD-10-CM

## 2023-04-30 ENCOUNTER — Encounter: Payer: Self-pay | Admitting: Neurosurgery

## 2023-05-09 ENCOUNTER — Encounter: Payer: Medicare Other | Admitting: Orthopedic Surgery

## 2023-05-16 ENCOUNTER — Ambulatory Visit (INDEPENDENT_AMBULATORY_CARE_PROVIDER_SITE_OTHER): Admitting: Neurosurgery

## 2023-05-16 ENCOUNTER — Encounter: Payer: Self-pay | Admitting: Neurosurgery

## 2023-05-16 VITALS — BP 142/70 | Ht 64.0 in | Wt 191.0 lb

## 2023-05-16 DIAGNOSIS — W19XXXD Unspecified fall, subsequent encounter: Secondary | ICD-10-CM | POA: Diagnosis not present

## 2023-05-16 DIAGNOSIS — S22080S Wedge compression fracture of T11-T12 vertebra, sequela: Secondary | ICD-10-CM

## 2023-05-16 DIAGNOSIS — G5603 Carpal tunnel syndrome, bilateral upper limbs: Secondary | ICD-10-CM | POA: Diagnosis not present

## 2023-05-16 DIAGNOSIS — S22080D Wedge compression fracture of T11-T12 vertebra, subsequent encounter for fracture with routine healing: Secondary | ICD-10-CM | POA: Diagnosis not present

## 2023-05-16 DIAGNOSIS — M4316 Spondylolisthesis, lumbar region: Secondary | ICD-10-CM

## 2023-05-16 DIAGNOSIS — M419 Scoliosis, unspecified: Secondary | ICD-10-CM

## 2023-05-16 NOTE — Progress Notes (Signed)
 Referring Physician:  Patrice Paradise, MD [id number] [facility] [place],  [place] [address]  Primary Physician:  Patrice Paradise, MD   DOS: 05/16/2022 (C4-6 ACDF)  History of Present Illness: 05/16/2023 Monica Ray presents with continued pain.  She is here today to review her results.  02/19/2023 Monica Ray presents with continued low back pain.  Her pain is improved compared to prior to her thoracic fracture.  Her thoracic fracture is improving.  She has been compliant with her brace.   11/13/2022 Monica Ray is doing very well from her neck surgery.  She has lost 50 pounds over the past 6 months.  She continues to have life limiting back pain.   06/26/2022 She is doing well from her neck surgery.  She is back at work.  Her back continues to bother her.  She is having dizziness.  05/31/2022 Monica Ray is status post anterior cervical disc fusion.  Her strength has returned.  She continues to have back issues.  Her swallowing has returned to normal.  She still having some voice related changes..  04/19/2022 Monica Ray is here today with a chief complaint of back and leg pain.  She has been having pain for approximately 2 years that are now limiting her ability to walk.  She has significant back and posterior buttock pain that occurs when she stands and tries to walk.  Standing is also problematic for her.  Her pain improves when she sits down.  Most of her pain is between her hip and her knees.  This is now limiting her day-to-day activities by substantial amount.  She also reports some problems with shortness of breath that has been worked up to identify whether there is a proximate cause.  She has some heart failure that is being managed currently.  She does report some issues with balance.  She she has some hand numbness that has been impacting her.  Bowel/Bladder Dysfunction: none  Conservative measures:  Physical therapy: denies has not  participated Multimodal medical therapy including regular antiinflammatories:  meloxicam, CBD cream,tramadol  Injections:  has not received cervical epidural steroid injections 10/13/2014: Left L4-5 transforaminal ESI 09/22/2014: Left L4-5 transforaminal ESI    Past Surgery:  lumbar decompression in 2016 by Dr Trixie Rude has symptoms of cervical myelopathy.  The symptoms are causing a significant impact on the patient's life.   I have utilized the care everywhere function in epic to review the outside records available from external health systems.  Review of Systems:  A 10 point review of systems is negative, except for the pertinent positives and negatives detailed in the HPI.  Past Medical History: Past Medical History:  Diagnosis Date   Anemia    before hysterectomy   Anginal pain (HCC)    Aortic atherosclerosis (HCC)    Cervical myelopathy (HCC)    Cervical spinal stenosis    Chronic heart failure with preserved ejection fraction (HFpEF) (HCC)    a. 09/2021 Echo: EF 60-65%, no rwma, GrI DD, nl RV fxn, RVSP 47.60mmHg. Mildly dil LA; b. 10/2021 RHC: RA 15, RV 50/9, PA 48/21 (32), PCWP 21, LVEDP 27.   CKD (chronic kidney disease), stage III (HCC)    DDD (degenerative disc disease), lumbar    Depression    Diabetes mellitus, type 2 (HCC)    DM retinopathy (HCC)    Dyspnea on exertion    Edema    GERD (gastroesophageal reflux disease)  History of bilateral cataract extraction 2021   Hx of phlebitis    Hyperlipidemia    Hypertension    Lumbar stenosis    Mild allergic rhinitis    MRSA (methicillin resistant staph aureus) culture positive 03/2006   Nonobstructive CAD (coronary artery disease)    a. 12/2015 Cath: LAD min irregs, otw nl cors; b. 08/2018 Cor CTA: Mid/Distal LAD and OM1 dzs w/ abnl ctFFR; c. 09/2018 Cath: LM nl, LAD 40p, 28m, D1 50, LCX small-nl, RCA nl, EF 55-65%; c. 10/2021 Cath: LM nl, LAD 40p/53m, lat D1 60, LCX sm nl, OM1 nl, RCA large, nl,  RPDA/PAV/PL1-3 nl.   OA (osteoarthritis)    hands   Obesity    OSA on CPAP    Pulmonary HTN (HCC)    a.) R/LHC 12/08/2015: mPA 25, mPCWP 15, AO sat 93.5, CO 6.69, CI 3.04; b.) TTE 09/21/2021: RVSP 47.8; c.) R/LHC 10/23/2021: mPA 32, mPCWP 21, PA sat 73, CO 11.5    Past Surgical History: Past Surgical History:  Procedure Laterality Date   ABDOMINAL HYSTERECTOMY     ADENOIDECTOMY     ANTERIOR CERVICAL DECOMP/DISCECTOMY FUSION N/A 05/16/2022   Procedure: C4-6 ANTERIOR CERVICAL DISCECTOMY AND FUSION;  Surgeon: Venetia Night, MD;  Location: [facility] ORS;  Service: Neurosurgery;  Laterality: N/A;   APPENDECTOMY  1989   CARDIAC CATHETERIZATION Bilateral 12/08/2015   Procedure: Right/Left Heart Cath and Coronary Angiography;  Surgeon: Iran Ouch, MD;  Location: [facility] INVASIVE CV LAB;  Service: Cardiovascular;  Laterality: Bilateral;   CATARACT EXTRACTION W/PHACO Left 05/12/2019   Procedure: CATARACT EXTRACTION PHACO AND INTRAOCULAR LENS PLACEMENT (IOC) LEFT VISION BLUE;  Surgeon: Elliot Cousin, MD;  Location: [facility];  Service: Ophthalmology;  Laterality: Left;  12.80 1:17.1   CATARACT EXTRACTION W/PHACO Right 06/11/2019   Procedure: CATARACT EXTRACTION PHACO AND INTRAOCULAR LENS PLACEMENT (IOC) RIGHT DIABETIC VISION BLUE 12.91  01:21.2;  Surgeon: Elliot Cousin, MD;  Location: [facility];  Service: Ophthalmology;  Laterality: Right;  Diabetic - insulin and oral meds   CHOLECYSTECTOMY  1989   LEFT HEART CATH AND CORONARY ANGIOGRAPHY Left 09/15/2018   Procedure: LEFT HEART CATH AND CORONARY ANGIOGRAPHY;  Surgeon: Iran Ouch, MD;  Location: [facility] INVASIVE CV LAB;  Service: Cardiovascular;  Laterality: Left;   LUMBAR LAMINECTOMY/DECOMPRESSION MICRODISCECTOMY N/A 11/25/2014   Procedure: LUMBAR LAMINECTOMY/DECOMPRESSION MICRODISCECTOMY LUMBAR TWO-THREE, LUMBAR THREE-FOUR ;  Surgeon: Tressie Stalker, MD;  Location: [facility];  Service: Neurosurgery;  Laterality: N/A;    RIGHT/LEFT HEART CATH AND CORONARY ANGIOGRAPHY Bilateral 10/23/2021   Procedure: RIGHT/LEFT HEART CATH AND CORONARY ANGIOGRAPHY;  Surgeon: Iran Ouch, MD;  Location: [facility] INVASIVE CV LAB;  Service: Cardiovascular;  Laterality: Bilateral;   ROTATOR CUFF REPAIR Right 08/2001    Allergies: Allergies as of 05/16/2023 - Review Complete 05/16/2023  Allergen Reaction Noted   Tegaderm ag mesh [silver] Rash 12/15/2015   Amoxicillin Hives and Rash 07/14/2013   Metformin Diarrhea and Nausea And Vomiting 11/16/2014   Doxycycline Nausea Only 06/19/2022   Oxycodone Itching 09/11/2018   Tape Other (See Comments) 09/11/2018   Azithromycin Rash and Itching 11/16/2014   Clindamycin Hives and Rash 11/16/2014   Fluconazole Itching    Simvastatin Other (See Comments) 11/16/2014    Medications: Current Meds  Medication Sig   acetaminophen (TYLENOL) 650 MG CR tablet Take by mouth.   alendronate (FOSAMAX) 70 MG tablet Take 70 mg by mouth once a week.   Cholecalciferol (VITAMIN D3) 50 MCG (2000 UT) TABS Take 2,000  Units by mouth daily.   cyanocobalamin (VITAMIN B12) 1000 MCG tablet Take 1,000 mcg by mouth daily.   DULoxetine (CYMBALTA) 30 MG capsule Take 30 mg by mouth daily.   furosemide (LASIX) 40 MG tablet Take 20 mg by mouth daily.   Insulin Lispro (HUMALOG IJ) Inject as directed. Sliding scale   nitroGLYCERIN (NITROSTAT) 0.4 MG SL tablet Place 1 tablet (0.4 mg total) under the tongue every 5 (five) minutes as needed for chest pain.   rosuvastatin (CRESTOR) 40 MG tablet Take by mouth.   triamcinolone cream (KENALOG) 0.1 % Apply topically 2 (two) times daily.   TYRVAYA 0.03 MG/ACT SOLN SMARTSIG:1 Spray(s) Both Nares    Social History: Social History   Tobacco Use   Smoking status: Never   Smokeless tobacco: Never  Vaping Use   Vaping status: Never Used  Substance Use Topics   Alcohol use: No   Drug use: No    Family Medical History: Family History  Problem Relation Age of Onset    Cancer Mother 3       lung   Diabetes Mother    Alcohol abuse Mother    Alcohol abuse Father    Cancer Father        throat   Aneurysm Father    Diabetes Maternal Grandmother    Aneurysm Paternal Grandmother    Breast cancer Paternal Aunt        52's   Cancer Sister     Physical Examination: Vitals:   05/16/23 1316  BP: (!) 142/70     General: Patient is well developed, well nourished, calm, collected, and in no apparent distress. Attention to examination is appropriate.  Neck:   Supple.  Full range of motion.  Respiratory: Patient is breathing without any difficulty.   NEUROLOGICAL:     Awake, alert, oriented to person, place, and time.  Speech is clear and fluent.   Moves all extremities well.     Medical Decision Making  Imaging: MRI CL spine 03/19/2022 IMPRESSION: 1. Progressive multilevel cervical spondylosis, most pronounced at the C4-5 and C5-6 levels where there is severe canal stenosis. Mild cord compression at C4-5. 2. Severe right and moderate left foraminal stenosis at C5-6.     Electronically Signed   By: Duanne Guess D.O.   On: 03/20/2022 09:54  IMPRESSION: 1. Transitional anatomy, as above. 2. Multilevel degenerative changes of the lumbar spine, progressed at L1-L2 where there is severe canal stenosis with severe left and moderate right foraminal stenosis. 3. Severe right and moderate-severe left foraminal stenosis at L2-L3. 4. Moderate-severe bilateral foraminal stenosis at L3-L4.     Electronically Signed   By: Duanne Guess D.O.   On: 03/20/2022 10:06  CT T spine 02/12/2023 IMPRESSION: 1. Counting down from C2 based on prior cervical spine CT 12/18/2022, the level of the compression fracture is actually T12 and the patient has 6 non-rib-bearing lumbar type segments. 2. Comminuted T12 vertebral body compression fracture shows evidence of interval attempted healing. There is 3-4 mm posteroinferior cortical retropulsion,  50% anterior, 50% central, 30% posterior height loss. The vertebral height is similar to the 01/15/2023 x-ray study. 3. Osteopenia, scoliosis and degenerative change. See above for complete details. 4. No other recent fractures are seen. 5. Aortic and coronary artery atherosclerosis. 6. Mild infrahilar bronchiectasis and scattered lower lobe linear scarring.   Aortic Atherosclerosis (ICD10-I70.0).     Electronically Signed   By: Almira Bar M.D.   On: 02/12/2023 21:10  MR Thoracic  Spine 04/08/2023 IMPRESSION: *Progression of T12 compression fracture with bone marrow edema suggesting ongoing compression. *Mild central canal stenosis at T6-7, T7-8, and T12. *Severe bilateral neural foraminal narrowing at T11-12 and T12-L1. * Please see above for more details.     Electronically Signed   By: Ala Bent M.D.   On: 04/28/2023 10:16    I have personally reviewed the images and agree with the above interpretation.  Assessment and Plan: Monica Ray is a pleasant 78 y.o. female with cervical myelopathy s/p C4-6 anterior cervical discectomy and fusion.  She is doing well from the surgery.    Her back pain is a more complex issue.  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  She has continued pain.  At this point, it would take a more extensive intervention with L2-5 lateral lumbar interbody fusion and posterior fusion from T10-L5.  She is autofused at L5-S1.    I will get a nerve conduction study as she may have carpal tunnel syndrome.  I will refer her to my partner should that come back positive.  She will think about whether she would like to pursue this magnitude of intervention.     I spent a total of 20 minutes in this patient's care today. This time was spent reviewing pertinent records including imaging studies, obtaining and confirming history, performing a directed evaluation, formulating and discussing my recommendations, and documenting the visit  within the medical record.       Vence Lalor K. Myer Haff MD, [facility] Neurosurgery

## 2023-05-23 ENCOUNTER — Other Ambulatory Visit: Payer: Self-pay

## 2023-05-23 ENCOUNTER — Encounter: Payer: Self-pay | Admitting: Neurology

## 2023-05-23 DIAGNOSIS — R202 Paresthesia of skin: Secondary | ICD-10-CM

## 2023-06-28 ENCOUNTER — Encounter: Admitting: Neurology

## 2023-07-30 ENCOUNTER — Ambulatory Visit: Payer: Medicare Other | Admitting: Cardiovascular Disease

## 2023-08-08 ENCOUNTER — Ambulatory Visit (INDEPENDENT_AMBULATORY_CARE_PROVIDER_SITE_OTHER): Admitting: Neurology

## 2023-08-08 DIAGNOSIS — R202 Paresthesia of skin: Secondary | ICD-10-CM | POA: Diagnosis not present

## 2023-08-08 DIAGNOSIS — G5603 Carpal tunnel syndrome, bilateral upper limbs: Secondary | ICD-10-CM

## 2023-08-08 NOTE — Procedures (Signed)
 [facility] Neurology  [address] [place], [address]  [place], [place] [address] Tel: [phone] Fax: [phone] Test Date:  08/08/2023  Patient: Monica Ray DOB: [birth year] Physician: Tonita Blanch, DO  Sex: Female Height: 5' 4 Ref Phys: Reeves Daisy, MD  ID#: [id number]   Technician:    History: This is a 78 year old female s/p C4-C6 ACDF referred for evaluation of bilateral hand paresthesias.  NCV & EMG Findings: Extensive electrodiagnostic testing of the right upper extremity and additional studies of the left shows:  Right median sensory response is absent.  Left median sensory response shows prolonged peak latency (6.2 ms) and reduced amplitude (5.3 V).  Bilateral ulnar sensory responses are within normal limits. Bilateral median motor responses show prolonged latency (L5.9, R8.8 ms).  Bilateral ulnar motor responses are within normal limits.   There is no evidence of active or chronic motor axonal loss changes affecting any of the tested muscles.  Motor unit configuration and recruitment pattern is within normal limits.    Impression: Bilateral median neuropathy at or distal to the wrist, consistent with a clinical diagnosis of carpal tunnel syndrome.  Overall, these findings are severe in degree electrically and worse on the right.   ___________________________ Tonita Blanch, DO    Nerve Conduction Studies   Stim Site NR Peak (ms) Norm Peak (ms) O-P Amp (V) Norm O-P Amp  Left Median Anti Sensory (2nd Digit)  32 C  Wrist    *6.2 <3.8 *5.3 >10  Right Median Anti Sensory (2nd Digit)  32 C  Wrist *NR  <3.8  >10  Left Ulnar Anti Sensory (5th Digit)  32 C  Wrist    3.1 <3.2 10.1 >5  Right Ulnar Anti Sensory (5th Digit)  32 C  Wrist    2.9 <3.2 9.1 >5     Stim Site NR Onset (ms) Norm Onset (ms) O-P Amp (mV) Norm O-P Amp Site1 Site2 Delta-0 (ms) Dist (cm) Vel (m/s) Norm Vel (m/s)  Left Median Motor (Abd Poll Brev)  32 C  Wrist    *5.9 <4.0 7.5 >5 Elbow Wrist  5.8 30.0 52 >50  Elbow    11.7  7.0         Right Median Motor (Abd Poll Brev)  32 C  Wrist    *8.8 <4.0 7.4 >5 Elbow Wrist 5.9 30.0 51 >50  Elbow    14.7  6.8         Left Ulnar Motor (Abd Dig Minimi)  32 C  Wrist    2.6 <3.1 9.0 >7 B Elbow Wrist 4.0 23.0 58 >50  B Elbow    6.6  8.0  A Elbow B Elbow 1.6 10.0 63 >50  A Elbow    8.2  7.7         Right Ulnar Motor (Abd Dig Minimi)  32 C  Wrist    2.7 <3.1 9.3 >7 B Elbow Wrist 4.0 23.0 58 >50  B Elbow    6.7  8.5  A Elbow B Elbow 1.7 10.0 59 >50  A Elbow    8.4  8.0          Electromyography   Side Muscle Ins.Act Fibs Fasc Recrt Amp Dur Poly Activation Comment  Right 1stDorInt Nml Nml Nml Nml Nml Nml Nml Nml N/A  Right Abd Poll Brev Nml Nml Nml Nml Nml Nml Nml Nml N/A  Right PronatorTeres Nml Nml Nml Nml Nml Nml Nml Nml N/A  Right Biceps Nml Nml Nml Nml  Nml Nml Nml Nml N/A  Right Triceps Nml Nml Nml Nml Nml Nml Nml Nml N/A  Right Deltoid Nml Nml Nml Nml Nml Nml Nml Nml N/A  Left 1stDorInt Nml Nml Nml Nml Nml Nml Nml Nml N/A  Left Abd Poll Brev Nml Nml Nml Nml Nml Nml Nml Nml N/A  Left PronatorTeres Nml Nml Nml Nml Nml Nml Nml Nml N/A  Left Biceps Nml Nml Nml Nml Nml Nml Nml Nml N/A  Left Triceps Nml Nml Nml Nml Nml Nml Nml Nml N/A  Left Deltoid Nml Nml Nml Nml Nml Nml Nml Nml N/A      Waveforms:

## 2023-08-16 ENCOUNTER — Encounter: Payer: Self-pay | Admitting: Physician Assistant

## 2023-08-16 ENCOUNTER — Ambulatory Visit: Attending: Physician Assistant | Admitting: Physician Assistant

## 2023-08-16 VITALS — BP 126/70 | HR 67 | Ht 64.0 in | Wt 197.6 lb

## 2023-08-16 DIAGNOSIS — I951 Orthostatic hypotension: Secondary | ICD-10-CM | POA: Diagnosis present

## 2023-08-16 DIAGNOSIS — R55 Syncope and collapse: Secondary | ICD-10-CM | POA: Diagnosis present

## 2023-08-16 DIAGNOSIS — I5032 Chronic diastolic (congestive) heart failure: Secondary | ICD-10-CM | POA: Insufficient documentation

## 2023-08-16 DIAGNOSIS — I2510 Atherosclerotic heart disease of native coronary artery without angina pectoris: Secondary | ICD-10-CM | POA: Diagnosis present

## 2023-08-16 NOTE — Telephone Encounter (Signed)
 Message Received: Today Clois Fret, MD  Maddilyn Campus, RN Agree with setting her up to see Dr. Claudene to evaluate her carpal tunnel. Thanks

## 2023-08-16 NOTE — Progress Notes (Signed)
 Cardiology Office Note    Date:  08/16/2023   ID:  Cambre, Matson 1945/05/26, MRN [id number]  PCP:  Marikay Eva POUR, PA  Cardiologist:  Deatrice Cage, MD  Electrophysiologist:  None   Chief Complaint: Follow up  History of Present Illness:   NEISHA HINGER is a 78 y.o. female with history of nonobstructive CAD, HFpEF, DM2, CKD stage III, recurrent syncope with orthostatic hypotension, HTN, HLD, prior back surgery, obesity, and sleep apnea with poor tolerance to CPAP presents for follow-up of CAD.  Prior R/LHC in 12/2015 showed normal LV systolic function with mildly elevated LVEDP and mild pulm hypertension.  There was mild nonobstructive CAD.  In the setting of recurrent chest pain in 08/2018 she underwent coronary CTA that showed moderate LAD disease that was significant by CT FFR.  Calcium  score was 120.  In this setting she underwent LHC 09/2018 that showed 40% proximal LAD disease and 50% ostial D1 stenosis.  EF was normal with moderately elevated LVEDP in the setting of elevated systemic blood pressure.  She had recurrent heart failure symptoms in 2023 and underwent R/LHC in 10/2021 that showed stable moderate one-vessel CAD involving the LAD with 40% proximal LAD stenosis, 30% mid LAD stenosis, and 50% D1 stenosis.  RHC showed moderately elevated right and left-sided filling pressure, mild pulmonary hypertension, and high cardiac output.   ABI in 11/2021 were normal bilaterally with some abnormal waveforms in the common femoral artery.  Duplex showed borderline disease in the iliac arteries bilaterally.   In 05/2022, she underwent cervical spine surgery and reported no significant improvement in symptoms.  Post procedures she had significant orthostatic dizziness despite holding irbesartan .  She also had gradual decline in renal function leading to the discontinuation of spironolactone  and transition of Actos  to Jardiance .     She was started on Jardiance  in 05/2022.  She  experienced her first syncopal episode in 06/2022 while at her nephrologist office with lab work showing dehydration at that time.  She had another syncopal episode in 11/2022.   She was admitted to the hospital in 12/2022 with recurrent syncope.  She reported having recently gotten up and walked to her bathroom to stand on her scale.  She looked down and felt dizzy and had blurred vision with associated syncope.  Throughout any of the episodes above she has not had symptoms of angina, palpitations, or diaphoresis.  High-sensitivity troponin negative.  Discharge summary indicates no arrhythmia on telemetry.  CT head and cervical spine without acute or traumatic finding.  Atherosclerotic calcification of the major vessels at the base of the brain noted.  CT of the lumbar spine showed mild compression deformity of T11 vertebral body suggesting acute compression fracture as well as incidental findings noted in radiology report.  Chest x-ray showed no acute cardiopulmonary process.  MRI of the brain showed no evidence of acute intracranial abnormality with mild chronic small vessel ischemic changes and left parietal scalp edema felt to possibly be posttraumatic in etiology.  Echo showed an EF of 60 to 65%, no regional wall motion abnormalities, grade 1 diastolic dysfunction, normal RV systolic function and ventricular cavity size, moderate mitral annular calcification, aortic valve sclerosis without evidence of stenosis, and an estimated right atrial pressure of 3 mmHg.  It was felt that severe orthostatic hypotension was the etiology of her recurrent syncope.  All blood pressure medications were discontinued.  She was started on fludrocortisone  and given gentle rehydration.  She followed up with endocrinology  on 12/25/2022 with recommendation to discontinue Jardiance  as it was felt this was leading to ongoing orthostasis contributing to her syncope.  At follow-up with our office her family noted the patient had not  been eating or drinking well and had also experienced significant weight loss with Ozempic.  Zio patch showed a predominant rhythm of sinus with an average rate of 70 bpm, first-degree AV block, 1 run of NSVT lasting 4 beats, 9 episodes of SVT lasting up to 9 beats, and rare atrial and ventricular ectopy.  She was last seen in the office in 01/2023 and was without further syncopal episodes.  She did report 1 episode of near syncope after standing up from bending over.  Symptoms were much improved following discontinuation of SGLT2 inhibitor and adequate hydration.  She comes in doing well from a cardiac perspective and is without symptoms of angina or cardiac decompensation.  Since discontinuing GLP-1 therapy and SGLT2 inhibitor, dizziness has resolved.  No near-syncope or syncope.  No palpitations.  No falls.  She continues to be significantly limited by back pain stemming from prior compression fracture.  She is frustrated by lack of weight loss off GLP-1 therapy.  She declines nutritionist referral.  She indicates she will discuss her weight further with endocrinology at next visit.  She takes as needed furosemide  1-2 times per month.   Labs independently reviewed: 07/2023 - Hgb 13.6, PLT 254, A1c 7.9, BUN 24, serum creatinine 1.05, potassium 4.2, albumin 3.9 02/2023 - AST/ALT normal, TC 125, TG 160, HDL 39, LDL 54 12/2022 - magnesium 2.1, TSH normal  Past Medical History:  Diagnosis Date   Anemia    before hysterectomy   Anginal pain (HCC)    Aortic atherosclerosis (HCC)    Cervical myelopathy (HCC)    Cervical spinal stenosis    Chronic heart failure with preserved ejection fraction (HFpEF) (HCC)    a. 09/2021 Echo: EF 60-65%, no rwma, GrI DD, nl RV fxn, RVSP 47.55mmHg. Mildly dil LA; b. 10/2021 RHC: RA 15, RV 50/9, PA 48/21 (32), PCWP 21, LVEDP 27.   CKD (chronic kidney disease), stage III (HCC)    DDD (degenerative disc disease), lumbar    Depression    Diabetes mellitus, type 2 (HCC)    DM  retinopathy (HCC)    Dyspnea on exertion    Edema    GERD (gastroesophageal reflux disease)    History of bilateral cataract extraction 2021   Hx of phlebitis    Hyperlipidemia    Hypertension    Lumbar stenosis    Mild allergic rhinitis    MRSA (methicillin resistant staph aureus) culture positive 03/2006   Nonobstructive CAD (coronary artery disease)    a. 12/2015 Cath: LAD min irregs, otw nl cors; b. 08/2018 Cor CTA: Mid/Distal LAD and OM1 dzs w/ abnl ctFFR; c. 09/2018 Cath: LM nl, LAD 40p, 33m, D1 50, LCX small-nl, RCA nl, EF 55-65%; c. 10/2021 Cath: LM nl, LAD 40p/85m, lat D1 60, LCX sm nl, OM1 nl, RCA large, nl, RPDA/PAV/PL1-3 nl.   OA (osteoarthritis)    hands   Obesity    OSA on CPAP    Pulmonary HTN (HCC)    a.) R/LHC 12/08/2015: mPA 25, mPCWP 15, AO sat 93.5, CO 6.69, CI 3.04; b.) TTE 09/21/2021: RVSP 47.8; c.) R/LHC 10/23/2021: mPA 32, mPCWP 21, PA sat 73, CO 11.5    Past Surgical History:  Procedure Laterality Date   ABDOMINAL HYSTERECTOMY     ADENOIDECTOMY     ANTERIOR  CERVICAL DECOMP/DISCECTOMY FUSION N/A 05/16/2022   Procedure: C4-6 ANTERIOR CERVICAL DISCECTOMY AND FUSION;  Surgeon: Clois Fret, MD;  Location: [facility] ORS;  Service: Neurosurgery;  Laterality: N/A;   APPENDECTOMY  1989   CARDIAC CATHETERIZATION Bilateral 12/08/2015   Procedure: Right/Left Heart Cath and Coronary Angiography;  Surgeon: Deatrice DELENA Cage, MD;  Location: [facility] INVASIVE CV LAB;  Service: Cardiovascular;  Laterality: Bilateral;   CATARACT EXTRACTION W/PHACO Left 05/12/2019   Procedure: CATARACT EXTRACTION PHACO AND INTRAOCULAR LENS PLACEMENT (IOC) LEFT VISION BLUE;  Surgeon: Ferol Rogue, MD;  Location: [facility];  Service: Ophthalmology;  Laterality: Left;  12.80 1:17.1   CATARACT EXTRACTION W/PHACO Right 06/11/2019   Procedure: CATARACT EXTRACTION PHACO AND INTRAOCULAR LENS PLACEMENT (IOC) RIGHT DIABETIC VISION BLUE 12.91  01:21.2;  Surgeon: Ferol Rogue, MD;  Location: [facility]  SURGERY [facility];  Service: Ophthalmology;  Laterality: Right;  Diabetic - insulin  and oral meds   CHOLECYSTECTOMY  1989   LEFT HEART CATH AND CORONARY ANGIOGRAPHY Left 09/15/2018   Procedure: LEFT HEART CATH AND CORONARY ANGIOGRAPHY;  Surgeon: Cage Deatrice DELENA, MD;  Location: [facility] INVASIVE CV LAB;  Service: Cardiovascular;  Laterality: Left;   LUMBAR LAMINECTOMY/DECOMPRESSION MICRODISCECTOMY N/A 11/25/2014   Procedure: LUMBAR LAMINECTOMY/DECOMPRESSION MICRODISCECTOMY LUMBAR TWO-THREE, LUMBAR THREE-FOUR ;  Surgeon: Reyes Budge, MD;  Location: [facility];  Service: Neurosurgery;  Laterality: N/A;   RIGHT/LEFT HEART CATH AND CORONARY ANGIOGRAPHY Bilateral 10/23/2021   Procedure: RIGHT/LEFT HEART CATH AND CORONARY ANGIOGRAPHY;  Surgeon: Cage Deatrice DELENA, MD;  Location: [facility] INVASIVE CV LAB;  Service: Cardiovascular;  Laterality: Bilateral;   ROTATOR CUFF REPAIR Right 08/2001    Current Medications: Current Meds  Medication Sig   acetaminophen  (TYLENOL ) 650 MG CR tablet Take by mouth.   alendronate (FOSAMAX) 70 MG tablet Take 70 mg by mouth once a week.   Cholecalciferol  (VITAMIN D3) 50 MCG (2000 UT) TABS Take 2,000 Units by mouth daily.   cyanocobalamin  (VITAMIN B12) 1000 MCG tablet Take 1,000 mcg by mouth daily.   DULoxetine  (CYMBALTA ) 30 MG capsule Take 30 mg by mouth daily.   furosemide  (LASIX ) 40 MG tablet Take 20 mg by mouth daily. (Patient taking differently: Take 20 mg by mouth as needed.)   Insulin  Lispro (HUMALOG IJ) Inject as directed. Sliding scale   nitroGLYCERIN  (NITROSTAT ) 0.4 MG SL tablet Place 1 tablet (0.4 mg total) under the tongue every 5 (five) minutes as needed for chest pain.   rosuvastatin  (CRESTOR ) 40 MG tablet Take by mouth.   RYBELSUS 7 MG TABS Take 1 tablet by mouth daily.   triamcinolone cream (KENALOG) 0.1 % Apply topically 2 (two) times daily.   TYRVAYA 0.03 MG/ACT SOLN SMARTSIG:1 Spray(s) Both Nares    Allergies:   Tegaderm ag mesh [silver], Amoxicillin,  Metformin, Doxycycline, Oxycodone, Tape, Azithromycin, Clindamycin, Fluconazole, and Simvastatin   Social History   Socioeconomic History   Marital status: Married    Spouse name: Not on file   Number of children: Not on file   Years of education: Not on file   Highest education level: Not on file  Occupational History   Not on file  Tobacco Use   Smoking status: Never   Smokeless tobacco: Never  Vaping Use   Vaping status: Never Used  Substance and Sexual Activity   Alcohol use: No   Drug use: No   Sexual activity: Not on file  Other Topics Concern   Not on file  Social History Narrative   Divorced X 23 years then remarried.  Social Drivers of Corporate investment banker Strain: Low Risk  (02/11/2023)   Received from [facility]   Overall Financial Resource Strain (CARDIA)    Difficulty of Paying Living Expenses: Not hard at all  Food Insecurity: No Food Insecurity (02/11/2023)   Received from [facility]   Hunger Vital Sign    Within the past 12 months, you worried that your food would run out before you got the money to buy more.: Never true    Within the past 12 months, the food you bought just didn't last and you didn't have money to get more.: Never true  Transportation Needs: No Transportation Needs (02/11/2023)   Received from [facility] - Transportation    In the past 12 months, has lack of transportation kept you from medical appointments or from getting medications?: No    Lack of Transportation (Non-Medical): No  Physical Activity: Not on file  Stress: Not on file  Social Connections: Not on file     Family History:  The patient's family history includes Alcohol abuse in her father and mother; Aneurysm in her father and paternal grandmother; Breast cancer in her paternal aunt; Cancer in her father and sister; Cancer (age of onset: 60) in her mother; Diabetes in her maternal grandmother and  mother.  ROS:   12-point review of systems is negative unless otherwise noted in the HPI.   EKGs/Labs/Other Studies Reviewed:    Studies reviewed were summarized above. The additional studies were reviewed today:  Zio patch 12/2022: Patient had a min HR of 55 bpm, max HR of 150 bpm, and avg HR of 70 bpm. Predominant underlying rhythm was Sinus Rhythm. First Degree AV Block was present. 1 run of Ventricular Tachycardia occurred lasting 4 beats with a max rate of 150 bpm (avg 145  bpm). 9 Supraventricular Tachycardia runs occurred, the run with the fastest interval lasting 9 beats with a max rate of 150 bpm (avg 125 bpm); the run with the fastest interval was also the longest. Some episodes of Supraventricular Tachycardia may be  possible Atrial Tachycardia with variable block.  Rare PACs and rare PVCs. __________  2D echo 12/19/2022: 1. Left ventricular ejection fraction, by estimation, is 60 to 65%. The  left ventricle has normal function. The left ventricle has no regional  wall motion abnormalities. Left ventricular diastolic parameters are  consistent with Grade I diastolic  dysfunction (impaired relaxation).   2. Right ventricular systolic function is normal. The right ventricular  size is normal. Tricuspid regurgitation signal is inadequate for assessing  PA pressure.   3. The mitral valve is normal in structure. No evidence of mitral valve  regurgitation. No evidence of mitral stenosis. Moderate mitral annular  calcification.   4. The aortic valve is normal in structure. There is moderate  calcification of the aortic valve. Aortic valve regurgitation is not  visualized. Aortic valve sclerosis/calcification is present, without any  evidence of aortic stenosis.   5. The inferior vena cava is normal in size with greater than 50%  respiratory variability, suggesting right atrial pressure of 3 mmHg.  __________   Aortoiliac vascular ultrasound 11/14/2021: Summary:  Abdominal  Aorta: Distal aorta was largely obscured by heavy calcification  and bowel gas. High velocities with triphasic pulses suggest high cardiac  output  Stenosis: +--------------------+-------------+  Location            Stenosis       +--------------------+-------------+  Right Common Iliac  >50% stenosis  +--------------------+-------------+  Left Common Iliac   >50% stenosis  +--------------------+-------------+  Right External Iliac>50% stenosis  +--------------------+-------------+  ___________   ABI 11/14/2021: Summary:  Right: Resting right ankle-brachial index is within normal range. The  right toe-brachial index is abnormal.   CFA waveforms suggestive of inflow disease.  Left: Resting left ankle-brachial index is within normal range. The left  toe-brachial index is abnormal.  ___________   [facility] 10/23/2021:   Mid LAD lesion is 30% stenosed.   Prox LAD lesion is 40% stenosed.   Lat 1st Diag lesion is 50% stenosed.   1.  Stable moderate one-vessel coronary artery disease involving the LAD.  No evidence of obstructive disease. 2.  Left ventricular angiography was not performed.  EF was normal by echo. 3.  Right heart catheterization showed moderately elevated right and left-sided filling pressures, mild pulmonary hypertension and high cardiac output.   RA: 15 mmHg RV: 50/9 mmHg PW: 21 mmHg PA: 48/21 with a mean of 32 mmHg Pulmonary vascular resistance is less than 1 Woods unit. LVEDP: 27 mmHg PA sat was 73% with a cardiac output of 11.5.   Recommendations: Continue medical therapy for nonobstructive coronary artery disease. Pulmonary hypertension is due to chronic diastolic heart failure. I elected to add spironolactone  25 mg once daily.  Continue furosemide  20 mg once daily.  Monitor renal function closely. Consider an SGLT2 inhibitor. __________   2D echo 09/21/2021: 1. Left ventricular ejection fraction, by estimation, is 60 to 65%. The  left ventricle  has normal function. The left ventricle has no regional  wall motion abnormalities. There is mild left ventricular hypertrophy.  Left ventricular diastolic parameters  are consistent with Grade I diastolic dysfunction (impaired relaxation).   2. Right ventricular systolic function is normal. The right ventricular  size is normal. There is moderately elevated pulmonary artery systolic  pressure. The estimated right ventricular systolic pressure is 47.8 mmHg.   3. Left atrial size was mildly dilated.   4. The mitral valve is normal in structure. No evidence of mitral valve  regurgitation. No evidence of mitral stenosis.   5. The aortic valve was not well visualized. Aortic valve regurgitation  is not visualized. No aortic stenosis is present.   6. The inferior vena cava is normal in size with greater than 50%  respiratory variability, suggesting right atrial pressure of 3 mmHg.  __________   LHC 09/15/2018: The left ventricular systolic function is normal. LV end diastolic pressure is moderately elevated. The left ventricular ejection fraction is 55-65% by visual estimate. Mid LAD lesion is 30% stenosed. Lat 1st Diag lesion is 50% stenosed. Prox LAD lesion is 40% stenosed.   1.  Moderate one-vessel nonobstructive coronary artery disease. 2.  Normal LV systolic function. 3.  Moderately elevated left ventricular end-diastolic pressure in the setting of moderately elevated systemic blood pressure.   Recommendations: Continue aggressive medical therapy. Recommend blood pressure control.  Continue furosemide . __________   Coronary CTA 08/25/2018: Calcium  score: Dense calcium  seen in proximal and mid RCA as well as large D1   Coronary Arteries: Right dominant with no anomalies   LM: Normal   LAD: Proximal / Mid 50-69% long calcified area of stenosis including the take off of D1 1-24% calcific disease in distal vessel   D1: Large branching vessel 1-24% calcific stenosis    Circumflex: Very small AV groove branch with no disease   RCA: Less than 25% ostial/ proximal calcific disease 25-49% calcific plaque  in mid vessel   PDA: Normal   PLA: Large with multiple branches supplies most of circumflex territory normal   IMPRESSION: 1. Calcium  score 929 which is 96 th percentile for age and sex involving RCA and LAD 2. CAD-RADS 3 CAD see description above with worst lesions involving the proximal and mid LAD Study will be sent for FFR CT 3.  Severe mitral annular calcification 4.  Normal aortic root 3.2 cm with calcific atherosclerosis   CT FFR: FINDINGS: FFR CT abnormal in mid LAD 0.81 and distal LAD 0.71   FFR CT abnormal in OM1 0.77   FFR CT normal in RCA   IMPRESSION: FFR CT suggests hemodynamically significant CAD in mid/distal LAD and OM1   Patient will be referred for heart catheterization __________   [facility] 12/08/2015: The left ventricular systolic function is normal. LV end diastolic pressure is mildly elevated. The left ventricular ejection fraction is 55-65% by visual estimate. Hemodynamic findings consistent with mild pulmonary hypertension.   1. Minor luminal irregularities affecting the LAD which is mildly calcified. Otherwise no evidence of obstructive disease. The left circumflex is very small but the territory supplied by the posterolateral branches of the right coronary artery. 2. Normal LV systolic function.   3. Right heart catheterization showed only mild pulmonary hypertension with mildly elevated filling pressures.   Recommendations: Cardiac findings do not explain the patient's severe exertional dyspnea. Consider pulmonary evaluation. There might be a component of physical deconditioning.   EKG:  EKG is ordered today.  The EKG ordered today demonstrates NSR, 67 bpm, no acute st/t changes  Recent Labs: 12/19/2022: Hemoglobin 14.3; Platelets 263; TSH 4.211 12/21/2022: BUN 17; Creatinine, Ser 0.98; Magnesium 2.1;  Potassium 3.3; Sodium 139  Recent Lipid Panel    Component Value Date/Time   CHOL 125 10/07/2013 0812   TRIG 103.0 10/07/2013 0812   HDL 33.90 (L) 10/07/2013 0812   CHOLHDL 4 10/07/2013 0812   VLDL 20.6 10/07/2013 0812   LDLCALC 71 10/07/2013 0812    PHYSICAL EXAM:    VS:  BP 126/70   Pulse 67   Ht 5' 4 (1.626 m)   Wt 197 lb 9.6 oz (89.6 kg)   SpO2 98%   BMI 33.92 kg/m   BMI: Body mass index is 33.92 kg/m.  Physical Exam Vitals reviewed.  Constitutional:      Appearance: She is well-developed.  HENT:     Head: Normocephalic and atraumatic.  Eyes:     General:        Right eye: No discharge.        Left eye: No discharge.  Cardiovascular:     Rate and Rhythm: Normal rate and regular rhythm.     Heart sounds: Normal heart sounds, S1 normal and S2 normal. Heart sounds not distant. No midsystolic click and no opening snap. No murmur heard.    No friction rub.  Pulmonary:     Effort: Pulmonary effort is normal. No respiratory distress.     Breath sounds: Normal breath sounds. No decreased breath sounds, wheezing, rhonchi or rales.  Chest:     Chest wall: No tenderness.  Musculoskeletal:     Cervical back: Normal range of motion.     Right lower leg: No edema.     Left lower leg: No edema.  Skin:    General: Skin is warm and dry.     Nails: There is no clubbing.  Neurological:     Mental Status: She is alert and oriented  to person, place, and time.  Psychiatric:        Speech: Speech normal.        Behavior: Behavior normal.        Thought Content: Thought content normal.        Judgment: Judgment normal.     Wt Readings from Last 3 Encounters:  08/16/23 197 lb 9.6 oz (89.6 kg)  05/16/23 191 lb (86.6 kg)  02/19/23 191 lb (86.6 kg)     ASSESSMENT & PLAN:   Recurrent syncope: Consistent with orthostatic hypotension likely exacerbated by weight loss with GLP-1 therapy/SGLT2 inhibitor, diminished oral intake, and dehydration.  Cardiac workup reassuring.  With  discontinuation of GLP-1 therapy and SGLT2 inhibitor symptoms have resolved.  No indication for loop recorder at this time.  No longer requiring fludrocortisone .  Nonobstructive CAD: No symptoms suggestive of angina or cardiac decompensation.  LDL 54 in 02/2023.  Rosuvastatin  40 mg.  HFpEF: Euvolemic, compensated.  Taking furosemide  20 mg approximately 1-2 times per month.  Not on SGLT2 inhibitor given issues with orthostasis leading to near syncope and syncope.  Defer rechallenge.     Disposition: F/u with Dr. Darron or an APP in 6 months.   Medication Adjustments/Labs and Tests Ordered: Current medicines are reviewed at length with the patient today.  Concerns regarding medicines are outlined above. Medication changes, Labs and Tests ordered today are summarized above and listed in the Patient Instructions accessible in Encounters.   Signed, Bernardino Bring, PA-C 08/16/2023 10:15 AM     [facility] [place] [address] [place], [place] [address] [phone]

## 2023-08-16 NOTE — Telephone Encounter (Signed)
 She is scheduled to see Dr Claudene on 08/23/23.

## 2023-08-16 NOTE — Patient Instructions (Signed)
 Medication Instructions:   Your physician recommends that you continue on your current medications as directed. Please refer to the Current Medication list given to you today.   *If you need a refill on your cardiac medications before your next appointment, please call your pharmacy*  Lab Work:  No labs ordered today   If you have labs (blood work) drawn today and your tests are completely normal, you will receive your results only by: MyChart Message (if you have MyChart) OR A paper copy in the mail If you have any lab test that is abnormal or we need to change your treatment, we will call you to review the results.  Testing/Procedures:  No test ordered today   Follow-Up: At [facility], you and your health needs are our priority.  As part of our continuing mission to provide you with exceptional heart care, our providers are all part of one team.  This team includes your primary Cardiologist (physician) and Advanced Practice Providers or APPs (Physician Assistants and Nurse Practitioners) who all work together to provide you with the care you need, when you need it.  Your next appointment:   6 month(s)  Provider:   You may see Deatrice Cage, MD or one of the following Advanced Practice Providers on your designated Care Team:    Bernardino Bring, PA-C

## 2023-08-23 ENCOUNTER — Other Ambulatory Visit: Payer: Self-pay

## 2023-08-23 ENCOUNTER — Encounter: Payer: Self-pay | Admitting: Neurosurgery

## 2023-08-23 ENCOUNTER — Ambulatory Visit: Admitting: Neurosurgery

## 2023-08-23 VITALS — BP 130/82 | Ht 64.0 in | Wt 197.0 lb

## 2023-08-23 DIAGNOSIS — Z01818 Encounter for other preprocedural examination: Secondary | ICD-10-CM

## 2023-08-23 DIAGNOSIS — G5601 Carpal tunnel syndrome, right upper limb: Secondary | ICD-10-CM | POA: Diagnosis not present

## 2023-08-23 NOTE — Progress Notes (Signed)
 Referring Physician:  Marikay Eva POUR, PA [address] [facility] [place],  [place] [address]  Primary Physician:  Marikay Eva POUR, [place]  History of Present Illness: 08/23/2023 [name] is here today with a chief complaint of severe bilateral carpal tunnel syndrome.  She states that her right side is much worse than her left side.  Goes numb throughout the day.  When she has a bad night of sleep this can be persistent for many hours after she wakes up.  She is having some difficulty with utilizing her right hand that she is dependent on for her work at the [employer].  She had a recent EMG which is listed below.  Review of Systems:  A 10 point review of systems is negative, except for the pertinent positives and negatives detailed in the HPI.  Past Medical History: Past Medical History:  Diagnosis Date   Anemia    before hysterectomy   Anginal pain (HCC)    Aortic atherosclerosis (HCC)    Cervical myelopathy (HCC)    Cervical spinal stenosis    Chronic heart failure with preserved ejection fraction (HFpEF) (HCC)    a. 09/2021 Echo: EF 60-65%, no rwma, GrI DD, nl RV fxn, RVSP 47.19mmHg. Mildly dil LA; b. 10/2021 RHC: RA 15, RV 50/9, PA 48/21 (32), PCWP 21, LVEDP 27.   CKD (chronic kidney disease), stage III (HCC)    DDD (degenerative disc disease), lumbar    Depression    Diabetes mellitus, type 2 (HCC)    DM retinopathy (HCC)    Dyspnea on exertion    Edema    GERD (gastroesophageal reflux disease)    History of bilateral cataract extraction 2021   Hx of phlebitis    Hyperlipidemia    Hypertension    Lumbar stenosis    Mild allergic rhinitis    MRSA (methicillin resistant staph aureus) culture positive 03/2006   Nonobstructive CAD (coronary artery disease)    a. 12/2015 Cath: LAD min irregs, otw nl cors; b. 08/2018 Cor CTA: Mid/Distal LAD and OM1 dzs w/ abnl ctFFR; c. 09/2018 Cath: LM nl, LAD 40p, 58m, D1 50, LCX small-nl, RCA nl, EF 55-65%; c. 10/2021  Cath: LM nl, LAD 40p/28m, lat D1 60, LCX sm nl, OM1 nl, RCA large, nl, RPDA/PAV/PL1-3 nl.   OA (osteoarthritis)    hands   Obesity    OSA on CPAP    Pulmonary HTN (HCC)    a.) R/LHC 12/08/2015: mPA 25, mPCWP 15, AO sat 93.5, CO 6.69, CI 3.04; b.) TTE 09/21/2021: RVSP 47.8; c.) R/LHC 10/23/2021: mPA 32, mPCWP 21, PA sat 73, CO 11.5    Past Surgical History: Past Surgical History:  Procedure Laterality Date   ABDOMINAL HYSTERECTOMY     ADENOIDECTOMY     ANTERIOR CERVICAL DECOMP/DISCECTOMY FUSION N/A 05/16/2022   Procedure: C4-6 ANTERIOR CERVICAL DISCECTOMY AND FUSION;  Surgeon: Clois Fret, MD;  Location: [facility] ORS;  Service: Neurosurgery;  Laterality: N/A;   APPENDECTOMY  1989   CARDIAC CATHETERIZATION Bilateral 12/08/2015   Procedure: Right/Left Heart Cath and Coronary Angiography;  Surgeon: Deatrice DELENA Cage, MD;  Location: [facility] INVASIVE CV LAB;  Service: Cardiovascular;  Laterality: Bilateral;   CATARACT EXTRACTION W/PHACO Left 05/12/2019   Procedure: CATARACT EXTRACTION PHACO AND INTRAOCULAR LENS PLACEMENT (IOC) LEFT VISION BLUE;  Surgeon: Ferol Rogue, MD;  Location: [facility];  Service: Ophthalmology;  Laterality: Left;  12.80 1:17.1   CATARACT EXTRACTION W/PHACO Right 06/11/2019   Procedure: CATARACT EXTRACTION  PHACO AND INTRAOCULAR LENS PLACEMENT (IOC) RIGHT DIABETIC VISION BLUE 12.91  01:21.2;  Surgeon: Ferol Rogue, MD;  Location: [facility];  Service: Ophthalmology;  Laterality: Right;  Diabetic - insulin  and oral meds   CHOLECYSTECTOMY  1989   LEFT HEART CATH AND CORONARY ANGIOGRAPHY Left 09/15/2018   Procedure: LEFT HEART CATH AND CORONARY ANGIOGRAPHY;  Surgeon: Darron Deatrice LABOR, MD;  Location: [facility] INVASIVE CV LAB;  Service: Cardiovascular;  Laterality: Left;   LUMBAR LAMINECTOMY/DECOMPRESSION MICRODISCECTOMY N/A 11/25/2014   Procedure: LUMBAR LAMINECTOMY/DECOMPRESSION MICRODISCECTOMY LUMBAR TWO-THREE, LUMBAR THREE-FOUR ;  Surgeon: Reyes Budge,  MD;  Location: [facility];  Service: Neurosurgery;  Laterality: N/A;   RIGHT/LEFT HEART CATH AND CORONARY ANGIOGRAPHY Bilateral 10/23/2021   Procedure: RIGHT/LEFT HEART CATH AND CORONARY ANGIOGRAPHY;  Surgeon: Darron Deatrice LABOR, MD;  Location: [facility] INVASIVE CV LAB;  Service: Cardiovascular;  Laterality: Bilateral;   ROTATOR CUFF REPAIR Right 08/2001    Allergies: Allergies as of 08/23/2023 - Review Complete 08/16/2023  Allergen Reaction Noted   Tegaderm ag mesh [silver] Rash 12/15/2015   Amoxicillin Hives and Rash 07/14/2013   Metformin Diarrhea and Nausea And Vomiting 11/16/2014   Doxycycline Nausea Only 06/19/2022   Oxycodone Itching 09/11/2018   Tape Other (See Comments) 09/11/2018   Azithromycin Rash and Itching 11/16/2014   Clindamycin Hives and Rash 11/16/2014   Fluconazole Itching    Simvastatin Other (See Comments) 11/16/2014    Medications:  Current Outpatient Medications:    acetaminophen  (TYLENOL ) 650 MG CR tablet, Take by mouth., Disp: , Rfl:    alendronate (FOSAMAX) 70 MG tablet, Take 70 mg by mouth once a week., Disp: , Rfl:    Cholecalciferol  (VITAMIN D3) 50 MCG (2000 UT) TABS, Take 2,000 Units by mouth daily., Disp: , Rfl:    cyanocobalamin  (VITAMIN B12) 1000 MCG tablet, Take 1,000 mcg by mouth daily., Disp: , Rfl:    DULoxetine  (CYMBALTA ) 30 MG capsule, Take 30 mg by mouth daily., Disp: , Rfl:    furosemide  (LASIX ) 40 MG tablet, Take 20 mg by mouth daily. (Patient taking differently: Take 20 mg by mouth as needed.), Disp: , Rfl:    Insulin  Lispro (HUMALOG IJ), Inject as directed. Sliding scale, Disp: , Rfl:    nitroGLYCERIN  (NITROSTAT ) 0.4 MG SL tablet, Place 1 tablet (0.4 mg total) under the tongue every 5 (five) minutes as needed for chest pain., Disp: 25 tablet, Rfl: 3   rosuvastatin  (CRESTOR ) 40 MG tablet, Take by mouth., Disp: , Rfl:    RYBELSUS 7 MG TABS, Take 1 tablet by mouth daily., Disp: , Rfl:    triamcinolone cream (KENALOG) 0.1 %, Apply topically 2  (two) times daily., Disp: , Rfl:    TYRVAYA 0.03 MG/ACT SOLN, SMARTSIG:1 Spray(s) Both Nares, Disp: , Rfl:   Social History: Social History   Tobacco Use   Smoking status: Never   Smokeless tobacco: Never  Vaping Use   Vaping status: Never Used  Substance Use Topics   Alcohol use: No   Drug use: No    Family Medical History: Family History  Problem Relation Age of Onset   Cancer Mother 78       lung   Diabetes Mother    Alcohol abuse Mother    Alcohol abuse Father    Cancer Father        throat   Aneurysm Father    Diabetes Maternal Grandmother    Aneurysm Paternal Grandmother    Breast cancer Paternal Aunt  70's   Cancer Sister     Physical Examination:  NEUROLOGICAL:     Awake, alert, oriented to person, place, and time.  Speech is clear and fluent.   Cranial Nerves: Pupils equal round and reactive to light.  Facial tone is symmetric. Shoulder shrug is symmetric. Tongue protrusion is midline.  There is no pronator drift.  Motor Exam:  She has mild intrinsic median weakness noted, she is at least 4 to 4+ out of 5 throughout.  Very mild wasting noted in the thenar eminence.   She does have decreased sensation splitting of the ring finger.    Electrodiagnostics:  [facility]  [address] [place], [address]  [place], [place] [address] Tel: [phone] Fax: [phone] Test Date:  08/08/2023   Patient: Ronal Loving DOB: 12-Aug-1945 Physician: Tonita Blanch, DO  Sex: Female Height: 5' 4 Ref Phys: Reeves Daisy, MD  ID#: [id number]     Technician:      History: This is a 78 year old female s/p C4-C6 ACDF referred for evaluation of bilateral hand paresthesias.   NCV & EMG Findings: Extensive electrodiagnostic testing of the right upper extremity and additional studies of the left shows:  Right median sensory response is absent.  Left median sensory response shows prolonged peak latency (6.2 ms) and reduced amplitude (5.3 V).  Bilateral  ulnar sensory responses are within normal limits. Bilateral median motor responses show prolonged latency (L5.9, R8.8 ms).  Bilateral ulnar motor responses are within normal limits.   There is no evidence of active or chronic motor axonal loss changes affecting any of the tested muscles.  Motor unit configuration and recruitment pattern is within normal limits.     Impression: Bilateral median neuropathy at or distal to the wrist, consistent with a clinical diagnosis of carpal tunnel syndrome.  Overall, these findings are severe in degree electrically and worse on the right.     ___________________________ Tonita Blanch, DO    I have personally reviewed the images and electrodiagnostics and agree with the above interpretation.  Assessment and Plan: Ms. Loisel is a pleasant 78 y.o. female with history of severe carpal tunnel syndrome.  She is here today for evaluation.  This is bilateral on electrodiagnostics but clinically worse on the right than it is on the left.  She states that she gets symptoms on a daily schedule.  She often uses her hand.  She notices when she sleeps as she does sleep on her right side with her right hand.  Her most recent EMG demonstrates severe bilateral carpal tunnel right worse than left.  We discussed the risks and benefits of carpal tunnel surgery and treatment for carpal tunnel.  Given her severe symptomatology with some hand weakness as well as thenar wasting and persistent sensory dysesthesias we will plan for a carpal tunnel release with ultrasound guidance. The symptoms are causing a significant impact on the patient's life. I have utilized the care everywhere function in epic to review the outside records available from external health systems, and have personally reviewed relevant imaging and electrodiagnostic workup.  Risk and benefits of surgery were discussed including pillar pain, incomplete decompression, nerve injury, wound healing issues.  She does have  more mild symptoms on the left which we will continue to follow conservatively.   Thank you for involving me in the care of this patient.    Penne MICAEL Sharps MD/MSCR Neurosurgery - Peripheral Nerve Surgery

## 2023-08-23 NOTE — Patient Instructions (Signed)
 Please see below for information in regards to your upcoming surgery:   Planned surgery: Right carpal tunnel release with ultrasound guidance   Surgery date: 09/19/23 at [facility] (Medical Mall: [address] [place], [place] [address]) - you will find out your arrival time the business day before your surgery.   Pre-op appointment at [facility] Pre-admit Testing: you will receive a call with a date/time for this appointment. If you are scheduled for an in person appointment, Pre-admit Testing is located on the first floor of the [facility] building, [address], [address]. During this appointment, they will advise you which medications you can take the morning of surgery, and which medications you will need to hold for surgery. Labs (such as blood work, EKG) may be done at your pre-op appointment. You are not required to fast for these labs. Should you need to change your pre-op appointment, please call Pre-admit testing at [phone].      Diabetes/weight loss medications: Per anesthesia guidelines (due to the increased risk of aspiration caused by delayed gastric emptying):  *Rybelsus oral tablets: hold 1 day before surgery *If you switch to Mounjaro before surgery* Mounjaro injection: hold 7 days prior to surgery     How to contact us :  If you have any questions/concerns before or after surgery, you can reach us  at [phone], or you can send a mychart message. We can be reached by phone or mychart 8am-4pm, Monday-Friday.  *Please note: Calls after 4pm are forwarded to a third party answering service. Mychart messages are not routinely monitored during evenings, weekends, and holidays. Please call our office to contact the answering service for urgent concerns during non-business hours.    If you have FMLA/disability paperwork, please drop it off or fax it to [phone]   Appointments/FMLA & disability paperwork: Reche &  Ritta Registered Nurse/Surgery scheduler: Taeshaun Rames, RN Certified Medical Assistants: Don, CMA, Elenor, CMA, & Damien, CMA Physician Assistants: Lyle Decamp, PA-C, Edsel Goods, PA-C & Glade Boys, PA-C Surgeons: Penne Sharps, MD & Reeves Daisy, MD   [facility] PREADMIT TESTING VISIT and SURGERY INFORMATION SHEET   Now that surgery has been scheduled you can anticipate several phone calls from [facility] services. A pharmacy technician will call you to verify your current list of medications taken at home.               The Pre-Service Center will call to verify your insurance information and to give you billing estimates and information.             The Preadmit Testing Office will be calling to schedule a visit to obtain information for the anesthesia team and provide instructions on preparation for surgery.  What can you expect for the Preadmit Testing Visit: Appointments may be scheduled in-person or by telephone.  If a telephone visit is scheduled, you may be asked to come into the office to have lab tests or other studies performed.   This visit will not be completed any greater than 14 days prior to your surgery.  If your surgery has been scheduled for a future date, please do not be alarmed if we have not contacted you to schedule an appointment more than a month prior to the surgery date.    Please be prepared to provide the following information during this appointment:            -Personal medical history                                               -  Medication and allergy list            -Any history of problems with anesthesia              -Recent lab work or diagnostic studies            -Please notify us  of any needs we should be aware of to provide the best care possible           -You will be provided with instructions on how to prepare for your surgery.    On The Day of Surgery:  You must have a driver to take you home after  surgery, you will be asked not to drive for 24 hours following surgery.  Taxi, Gisele and non-medical transport will not be acceptable means of transportation unless you have a responsible individual who will be traveling with you.  Visitors in the surgical area:   2 people will be able to visit you in your room once your preparation for surgery has been completed. During surgery, your visitors will be asked to wait in the Surgery Waiting Area.  It is not a requirement for them to stay, if they prefer to leave and come back.  Your visitor(s) will be given an update once the surgery has been completed.  No visitors are allowed in the initial recovery room to respect patient privacy and safety.  Once you are more awake and transfer to the secondary recovery area, or are transferred to an inpatient room, visitors will again be able to see you.  To respect and protect your privacy: We will ask on the day of surgery who your driver will be and what the contact number for that individual will be. We will ask if it is okay to share information with this individual, or if there is an alternative individual that we, or the surgeon, should contact to provide updates and information. If family or friends come to the surgical information desk requesting information about you, who you have not listed with us , no information will be given.   It may be helpful to designate someone as the main contact who will be responsible for updating your other friends and family.    PREADMIT TESTING OFFICE: [phone] SAME DAY SURGERY: [phone] We look forward to caring for you before and throughout the process of your surgery.

## 2023-08-23 NOTE — H&P (View-Only) (Signed)
 Referring Physician:  Marikay Eva POUR, PA [address] [facility] [place],  [place] [address]  Primary Physician:  Marikay Eva POUR, [place]  History of Present Illness: 08/23/2023 [name] is here today with a chief complaint of severe bilateral carpal tunnel syndrome.  She states that her right side is much worse than her left side.  Goes numb throughout the day.  When she has a bad night of sleep this can be persistent for many hours after she wakes up.  She is having some difficulty with utilizing her right hand that she is dependent on for her work at the [employer].  She had a recent EMG which is listed below.  Review of Systems:  A 10 point review of systems is negative, except for the pertinent positives and negatives detailed in the HPI.  Past Medical History: Past Medical History:  Diagnosis Date   Anemia    before hysterectomy   Anginal pain (HCC)    Aortic atherosclerosis (HCC)    Cervical myelopathy (HCC)    Cervical spinal stenosis    Chronic heart failure with preserved ejection fraction (HFpEF) (HCC)    a. 09/2021 Echo: EF 60-65%, no rwma, GrI DD, nl RV fxn, RVSP 47.51mmHg. Mildly dil LA; b. 10/2021 RHC: RA 15, RV 50/9, PA 48/21 (32), PCWP 21, LVEDP 27.   CKD (chronic kidney disease), stage III (HCC)    DDD (degenerative disc disease), lumbar    Depression    Diabetes mellitus, type 2 (HCC)    DM retinopathy (HCC)    Dyspnea on exertion    Edema    GERD (gastroesophageal reflux disease)    History of bilateral cataract extraction 2021   Hx of phlebitis    Hyperlipidemia    Hypertension    Lumbar stenosis    Mild allergic rhinitis    MRSA (methicillin resistant staph aureus) culture positive 03/2006   Nonobstructive CAD (coronary artery disease)    a. 12/2015 Cath: LAD min irregs, otw nl cors; b. 08/2018 Cor CTA: Mid/Distal LAD and OM1 dzs w/ abnl ctFFR; c. 09/2018 Cath: LM nl, LAD 40p, 61m, D1 50, LCX small-nl, RCA nl, EF 55-65%; c. 10/2021  Cath: LM nl, LAD 40p/67m, lat D1 60, LCX sm nl, OM1 nl, RCA large, nl, RPDA/PAV/PL1-3 nl.   OA (osteoarthritis)    hands   Obesity    OSA on CPAP    Pulmonary HTN (HCC)    a.) R/LHC 12/08/2015: mPA 25, mPCWP 15, AO sat 93.5, CO 6.69, CI 3.04; b.) TTE 09/21/2021: RVSP 47.8; c.) R/LHC 10/23/2021: mPA 32, mPCWP 21, PA sat 73, CO 11.5    Past Surgical History: Past Surgical History:  Procedure Laterality Date   ABDOMINAL HYSTERECTOMY     ADENOIDECTOMY     ANTERIOR CERVICAL DECOMP/DISCECTOMY FUSION N/A 05/16/2022   Procedure: C4-6 ANTERIOR CERVICAL DISCECTOMY AND FUSION;  Surgeon: Clois Fret, MD;  Location: [facility] ORS;  Service: Neurosurgery;  Laterality: N/A;   APPENDECTOMY  1989   CARDIAC CATHETERIZATION Bilateral 12/08/2015   Procedure: Right/Left Heart Cath and Coronary Angiography;  Surgeon: Deatrice DELENA Cage, MD;  Location: [facility] INV[facility]IVE CV [facility];  Service: Cardiovascular;  Laterality: Bilateral;   CATARACT EXTRACTION W/PHACO Left 05/12/2019   Procedure: CATARACT EXTRACTION PHACO AND INTRAOCULAR LENS PLACEMENT (IOC) LEFT VISION BLUE;  Surgeon: Ferol Rogue, MD;  Location: [facility];  Service: Ophthalmology;  Laterality: Left;  12.80 1:17.1   CATARACT EXTRACTION W/PHACO Right 06/11/2019   Procedure: CATARACT EXTRACTION  PHACO AND INTRAOCULAR LENS PLACEMENT (IOC) RIGHT DIABETIC VISION BLUE 12.91  01:21.2;  Surgeon: Ferol Rogue, MD;  Location: [facility];  Service: Ophthalmology;  Laterality: Right;  Diabetic - insulin  and oral meds   CHOLECYSTECTOMY  1989   LEFT HEART CATH AND CORONARY ANGIOGRAPHY Left 09/15/2018   Procedure: LEFT HEART CATH AND CORONARY ANGIOGRAPHY;  Surgeon: Darron Deatrice LABOR, MD;  Location: [facility] INVASIVE CV LAB;  Service: Cardiovascular;  Laterality: Left;   LUMBAR LAMINECTOMY/DECOMPRESSION MICRODISCECTOMY N/A 11/25/2014   Procedure: LUMBAR LAMINECTOMY/DECOMPRESSION MICRODISCECTOMY LUMBAR TWO-THREE, LUMBAR THREE-FOUR ;  Surgeon: Reyes Budge,  MD;  Location: [facility];  Service: Neurosurgery;  Laterality: N/A;   RIGHT/LEFT HEART CATH AND CORONARY ANGIOGRAPHY Bilateral 10/23/2021   Procedure: RIGHT/LEFT HEART CATH AND CORONARY ANGIOGRAPHY;  Surgeon: Darron Deatrice LABOR, MD;  Location: [facility] CV [facility];  Service: Cardiovascular;  Laterality: Bilateral;   ROTATOR CUFF REPAIR Right 08/2001    Allergies: Allergies as of 08/23/2023 - Review Complete 08/16/2023  Allergen Reaction Noted   Tegaderm ag mesh [silver] Rash 12/15/2015   Amoxicillin Hives and Rash 07/14/2013   Metformin Diarrhea and Nausea And Vomiting 11/16/2014   Doxycycline Nausea Only 06/19/2022   Oxycodone Itching 09/11/2018   Tape Other (See Comments) 09/11/2018   Azithromycin Rash and Itching 11/16/2014   Clindamycin Hives and Rash 11/16/2014   Fluconazole Itching    Simvastatin Other (See Comments) 11/16/2014    Medications:  Current Outpatient Medications:    acetaminophen  (TYLENOL ) 650 MG CR tablet, Take by mouth., Disp: , Rfl:    alendronate (FOSAMAX) 70 MG tablet, Take 70 mg by mouth once a week., Disp: , Rfl:    Cholecalciferol  (VITAMIN D3) 50 MCG (2000 UT) TABS, Take 2,000 Units by mouth daily., Disp: , Rfl:    cyanocobalamin  (VITAMIN B12) 1000 MCG tablet, Take 1,000 mcg by mouth daily., Disp: , Rfl:    DULoxetine  (CYMBALTA ) 30 MG capsule, Take 30 mg by mouth daily., Disp: , Rfl:    furosemide  (LASIX ) 40 MG tablet, Take 20 mg by mouth daily. (Patient taking differently: Take 20 mg by mouth as needed.), Disp: , Rfl:    Insulin  Lispro (HUMALOG IJ), Inject as directed. Sliding scale, Disp: , Rfl:    nitroGLYCERIN  (NITROSTAT ) 0.4 MG SL tablet, Place 1 tablet (0.4 mg total) under the tongue every 5 (five) minutes as needed for chest pain., Disp: 25 tablet, Rfl: 3   rosuvastatin  (CRESTOR ) 40 MG tablet, Take by mouth., Disp: , Rfl:    RYBELSUS 7 MG TABS, Take 1 tablet by mouth daily., Disp: , Rfl:    triamcinolone cream (KENALOG) 0.1 %, Apply topically 2  (two) times daily., Disp: , Rfl:    TYRVAYA 0.03 MG/ACT SOLN, SMARTSIG:1 Spray(s) Both Nares, Disp: , Rfl:   Social History: Social History   Tobacco Use   Smoking status: Never   Smokeless tobacco: Never  Vaping Use   Vaping status: Never Used  Substance Use Topics   Alcohol use: No   Drug use: No    Family Medical History: Family History  Problem Relation Age of Onset   Cancer Mother 78       lung   Diabetes Mother    Alcohol abuse Mother    Alcohol abuse Father    Cancer Father        throat   Aneurysm Father    Diabetes Maternal Grandmother    Aneurysm Paternal Grandmother    Breast cancer Paternal Aunt  70's   Cancer Sister     Physical Examination:  NEUROLOGICAL:     Awake, alert, oriented to person, place, and time.  Speech is clear and fluent.   Cranial Nerves: Pupils equal round and reactive to light.  Facial tone is symmetric. Shoulder shrug is symmetric. Tongue protrusion is midline.  There is no pronator drift.  Motor Exam:  She has mild intrinsic median weakness noted, she is at least 4 to 4+ out of 5 throughout.  Very mild wasting noted in the thenar eminence.   She does have decreased sensation splitting of the ring finger.    Electrodiagnostics:  [facility]  [address], [address]  [place], [place] [address] Tel: [phone] Fax: [phone] Test Date:  08/08/2023   Patient: Ronal Loving DOB: Apr 24, 1945 Physician: Tonita Blanch, DO  Sex: Female Height: 5' 4 Ref Phys: Reeves Daisy, MD  ID#: [id number]     Technician:      History: This is a 78 year old female s/p C4-C6 ACDF referred for evaluation of bilateral hand paresthesias.   NCV & EMG Findings: Extensive electrodiagnostic testing of the right upper extremity and additional studies of the left shows:  Right median sensory response is absent.  Left median sensory response shows prolonged peak latency (6.2 ms) and reduced amplitude (5.3 V).  Bilateral  ulnar sensory responses are within normal limits. Bilateral median motor responses show prolonged latency (L5.9, R8.8 ms).  Bilateral ulnar motor responses are within normal limits.   There is no evidence of active or chronic motor axonal loss changes affecting any of the tested muscles.  Motor unit configuration and recruitment pattern is within normal limits.     Impression: Bilateral median neuropathy at or distal to the wrist, consistent with a clinical diagnosis of carpal tunnel syndrome.  Overall, these findings are severe in degree electrically and worse on the right.     ___________________________ Tonita Blanch, DO    I have personally reviewed the images and electrodiagnostics and agree with the above interpretation.  Assessment and Plan: Ms. Gerken is a pleasant 78 y.o. female with history of severe carpal tunnel syndrome.  She is here today for evaluation.  This is bilateral on electrodiagnostics but clinically worse on the right than it is on the left.  She states that she gets symptoms on a daily schedule.  She often uses her hand.  She notices when she sleeps as she does sleep on her right side with her right hand.  Her most recent EMG demonstrates severe bilateral carpal tunnel right worse than left.  We discussed the risks and benefits of carpal tunnel surgery and treatment for carpal tunnel.  Given her severe symptomatology with some hand weakness as well as thenar wasting and persistent sensory dysesthesias we will plan for a carpal tunnel release with ultrasound guidance. The symptoms are causing a significant impact on the patient's life. I have utilized the care everywhere function in epic to review the outside records available from external health systems, and have personally reviewed relevant imaging and electrodiagnostic workup.  Risk and benefits of surgery were discussed including pillar pain, incomplete decompression, nerve injury, wound healing issues.  She does have  more mild symptoms on the left which we will continue to follow conservatively.   Thank you for involving me in the care of this patient.    Penne MICAEL Sharps MD/MSCR Neurosurgery - Peripheral Nerve Surgery

## 2023-09-04 ENCOUNTER — Other Ambulatory Visit: Payer: Self-pay | Admitting: Physician Assistant

## 2023-09-04 DIAGNOSIS — Z1231 Encounter for screening mammogram for malignant neoplasm of breast: Secondary | ICD-10-CM

## 2023-09-09 ENCOUNTER — Telehealth: Payer: Self-pay

## 2023-09-09 NOTE — Telephone Encounter (Signed)
 Monica Ray,  Monica Ray is requesting preoperative cardiac evaluation for carpal tunnel release with ultrasound guidance.  She was seen in the clinic by you on 08/16/2023.  During that time she was stable from a cardiac standpoint.  Would you be able to provide recommendations on cardiac risk for upcoming surgery?    Thank you for your help,  [name]. Aava Deland NP-C     09/09/2023, 12:15 PM [facility] [address] Office [phone] Fax [phone]

## 2023-09-09 NOTE — Telephone Encounter (Signed)
   Pre-operative Risk Assessment    Patient Name: Monica Ray  DOB: [birth year] MRN: [id number]   Date of last office visit: 08/16/2023, Bernardino Bring, PA-C Date of next office visit: NONE   Request for Surgical Clearance    Procedure:  Right Carpal Tunnel release with ultrasound guidance  Date of Surgery:  Clearance 09/19/23                                Surgeon: Dr. Penne Sharps, MD Surgeon's Group or Practice Name: [facility] Phone number: [phone] Fax number: [phone]   Type of Clearance Requested:   - Medical    Type of Anesthesia:  MAC   Additional requests/questions:    SignedAsberry KANDICE Dunning   09/09/2023, 11:57 AM

## 2023-09-09 NOTE — Telephone Encounter (Signed)
   Patient Name: Monica Ray  DOB: 1945-11-11 MRN: [id number]  Primary Cardiologist: Deatrice Cage, MD  Chart reviewed as part of pre-operative protocol coverage. Given past medical history and time since last visit, based on ACC/AHA guidelines, Monica Ray is at acceptable risk for the planned procedure without further cardiovascular testing.   I will route this recommendation to the requesting party via Epic fax function and remove from pre-op pool.  Please call with questions.  Lonni Meager, NP 09/09/2023, 2:22 PM

## 2023-09-11 ENCOUNTER — Encounter
Admission: RE | Admit: 2023-09-11 | Discharge: 2023-09-11 | Disposition: A | Discharge status: Home / Self Care | Source: Ambulatory Visit | Attending: Neurosurgery | Admitting: Neurosurgery

## 2023-09-11 ENCOUNTER — Other Ambulatory Visit: Payer: Self-pay

## 2023-09-11 DIAGNOSIS — E1122 Type 2 diabetes mellitus with diabetic chronic kidney disease: Secondary | ICD-10-CM

## 2023-09-11 DIAGNOSIS — G5603 Carpal tunnel syndrome, bilateral upper limbs: Secondary | ICD-10-CM

## 2023-09-11 NOTE — Patient Instructions (Addendum)
 Your procedure is scheduled on: 09/19/23 - Thursday Report to the Registration Desk on the 1st floor of the Medical Mall. To find out your arrival time, please call [phone] between 1PM - 3PM on: 09/18/23 - Wednesday If your arrival time is 6:00 am, do not arrive before that time as the Medical Mall entrance doors do not open until 6:00 am.  REMEMBER: Instructions that are not followed completely may result in serious medical risk, up to and including death; or upon the discretion of your surgeon and anesthesiologist your surgery may need to be rescheduled.  Do not eat food after midnight the night before surgery.  No gum chewing or hard candies.  You may however, drink CLEAR liquids up to 2 hours before you are scheduled to arrive for your surgery. Do not drink anything within 2 hours of your scheduled arrival time.  Clear liquids include: - water   One week prior to surgery: Stop Anti-inflammatories (NSAIDS) such as Advil, Aleve, Ibuprofen, Motrin, Naproxen, Naprosyn and Aspirin  based products such as Excedrin, Goody's Powder, BC Powder. You may continue to take Tylenol  if needed for pain up until the day of surgery.  Stop ANY OVER THE COUNTER supplements until after surgery : VITAMIN D3 ,FLAXSEED OIL .  We have instructed the patient to hold their diabetes medication(s) for surgery as listed below:   - Mounjaro injection: hold 7 days prior to surgery   - Insulin  glargine, (TOUJEO ) - inject only 1/2 of your prescribed dose of Insulin  on the night before your surgery.  HOLD insulin  lispro (HUMALOG) your dose of Insulin  on the morning of surgery. May resume with meals.  ON THE DAY OF SURGERY ONLY TAKE THESE MEDICATIONS WITH SIPS OF WATER:  none   No Alcohol for 24 hours before or after surgery.  No Smoking including e-cigarettes for 24 hours before surgery.  No chewable tobacco products for at least 6 hours before surgery.  No nicotine patches on the day of surgery.  Do  not use any recreational drugs for at least a week (preferably 2 weeks) before your surgery.  Please be advised that the combination of cocaine and anesthesia may have negative outcomes, up to and including death. If you test positive for cocaine, your surgery will be cancelled.  On the morning of surgery brush your teeth with toothpaste and water, you may rinse your mouth with mouthwash if you wish. Do not swallow any toothpaste or mouthwash.  Use CHG Soap or wipes as directed on instruction sheet.  Do not wear jewelry, make-up, hairpins, clips or nail polish.  For welded (permanent) jewelry: bracelets, anklets, waist bands, etc.  Please have this removed prior to surgery.  If it is not removed, there is a chance that hospital personnel will need to cut it off on the day of surgery.  Do not wear lotions, powders, or perfumes.   Do not shave body hair from the neck down 48 hours before surgery.  Contact lenses, hearing aids and dentures may not be worn into surgery.  Do not bring valuables to the hospital. [facility] is not responsible for any missing/lost belongings or valuables.   Bring your C-PAP to the hospital in case you may have to spend the night.   Notify your doctor if there is any change in your medical condition (cold, fever, infection).  Wear comfortable clothing (specific to your surgery type) to the hospital.  After surgery, you can help prevent lung complications by doing breathing exercises.  Take deep breaths and cough every 1-2 hours. Your doctor may order a device called an Incentive Spirometer to help you take deep breaths.  When coughing or sneezing, hold a pillow firmly against your incision with both hands. This is called "splinting." Doing this helps protect your incision. It also decreases belly discomfort.  If you are being admitted to the hospital overnight, leave your suitcase in the car. After surgery it may be brought to your room.  In case of  increased patient census, it may be necessary for you, the patient, to continue your postoperative care in the Same Day Surgery department.  If you are being discharged the day of surgery, you will not be allowed to drive home. You will need a responsible individual to drive you home and stay with you for 24 hours after surgery.   If you are taking public transportation, you will need to have a responsible individual with you.  Please call the Pre-admissions Testing Dept. at [phone] if you have any questions about these instructions.  Surgery Visitation Policy:  Patients having surgery or a procedure may have two visitors.  Children under the age of 76 must have an adult with them who is not the patient.  Inpatient Visitation:    Visiting hours are 7 a.m. to 8 p.m. Up to four visitors are allowed at one time in a patient room. The visitors may rotate out with other people during the day.  One visitor age 56 or older may stay with the patient overnight and must be in the room by 8 p.m.   Merchandiser, retail to address health-related social needs:  [email or web address]     Preparing for Surgery with CHLORHEXIDINE  GLUCONATE (CHG) Soap  Chlorhexidine  Gluconate (CHG) Soap  o An antiseptic cleaner that kills germs and bonds with the skin to continue killing germs even after washing  o Used for showering the night before surgery and morning of surgery  Before surgery, you can play an important role by reducing the number of germs on your skin.  CHG (Chlorhexidine  gluconate) soap is an antiseptic cleanser which kills germs and bonds with the skin to continue killing germs even after washing.  Please do not use if you have an allergy to CHG or antibacterial soaps. If your skin becomes reddened/irritated stop using the CHG.  1. Shower the NIGHT BEFORE SURGERY and the MORNING OF SURGERY with CHG soap.  2. If you choose to wash your hair, wash your hair first  as usual with your normal shampoo.  3. After shampooing, rinse your hair and body thoroughly to remove the shampoo.  4. Use CHG as you would any other liquid soap. You can apply CHG directly to the skin and wash gently with a scrungie or a clean washcloth.  5. Apply the CHG soap to your body only from the neck down. Do not use on open wounds or open sores. Avoid contact with your eyes, ears, mouth, and genitals (private parts). Wash face and genitals (private parts) with your normal soap.  6. Wash thoroughly, paying special attention to the area where your surgery will be performed.  7. Thoroughly rinse your body with warm water.  8. Do not shower/wash with your normal soap after using and rinsing off the CHG soap.  9. Pat yourself dry with a clean towel.  10. Wear clean pajamas to bed the night before surgery.  12. Place clean sheets on your bed the night of your first shower and do  not sleep with pets.  13. Shower again with the CHG soap on the day of surgery prior to arriving at the hospital.  14. Do not apply any deodorants/lotions/powders.  15. Please wear clean clothes to the hospital.

## 2023-09-17 ENCOUNTER — Encounter: Payer: Self-pay | Admitting: Neurosurgery

## 2023-09-17 ENCOUNTER — Ambulatory Visit
Admission: RE | Admit: 2023-09-17 | Discharge: 2023-09-17 | Disposition: A | Discharge status: Home / Self Care | Source: Ambulatory Visit | Attending: Physician Assistant | Admitting: Physician Assistant

## 2023-09-17 DIAGNOSIS — Z1231 Encounter for screening mammogram for malignant neoplasm of breast: Secondary | ICD-10-CM | POA: Diagnosis present

## 2023-09-17 NOTE — Progress Notes (Signed)
  Perioperative Services: Pre-Admission/Anesthesia Testing  Abnormal Lab Notification    Date: 09/17/23  Name: CECILIA Ray MRN:   [id number]  Re: Abnormal labs noted during PAT appointment   Provider(s) Notified: Claudene Penne LELON, MD Notification mode: Routed and/or faxed via [facility]  Planned procedure: CARPAL TUNNEL RELEASE (Right)   ABNORMAL LAB VALUE(S): Lab Results  Component Value Date   GLUCOSE 154 (H) 12/21/2022    Notes: Patient with a T2DM diagnosis. She is currently on  multiple  parenteral therapies including Toujeo , Humalog, and Mounjaro. Last Hgb A1c was 8.1% on 08/28/2023. In efforts to reduce the risk of developing SSI, or other potential perioperative complications, this communication is being sent in order to determine if patient is deemed to be adequately optimized  for surgery.   In light of the aforementioned A1c, her diabetes could pose increased risks for the patient during their perioperative course and subsequent recovery period. With that being said, the benefit of improving glycemic control must be weighed against the overall risks associated with delaying a necessary elective surgical procedure for this patient.   This is a preoperative FYI. No formal response indicated if there are no proposed changes to patient's surgical plan of care. Should there be changes, please let me know.   Dorise Pereyra, MSN, APRN, FNP-C, CEN [facility]  Perioperative Services Nurse Practitioner Phone: [phone] Fax: [phone] 09/17/23 4:15 PM

## 2023-09-17 NOTE — Progress Notes (Signed)
 Perioperative / Anesthesia Services  Pre-Admission Testing Clinical Review / Pre-Operative Anesthesia Consult  Date: 09/17/23  PATIENT DEMOGRAPHICS: Name: Monica Ray DOB: 11-06-1945 MRN:   [id number]  Note: Available PAT nursing documentation and vital signs have been reviewed. Clinical nursing staff has updated patient's PMH/PSHx, current medication list, and drug allergies/intolerances to ensure complete and comprehensive history available to assist care teams in MDM as it pertains to the aforementioned surgical procedure and anticipated anesthetic course. Extensive review of available clinical information personally performed. [place] [facility] and PSHx updated with any diagnoses/procedures that  may have been inadvertently omitted during her intake with the pre-admission testing department's nursing staff.  PLANNED SURGICAL PROCEDURE(S):   Case: [id number] Date/Time: 09/19/23 0700   Procedure: CARPAL TUNNEL RELEASE (Right) - RIGHT CARPAL TUNNEL RELEASE WITH ULTRASOUND GUIDANCE   Anesthesia type: Monitor Anesthesia Care   Diagnosis: Right carpal tunnel syndrome [G56.01]   Pre-op diagnosis: G56.01 Right carpal tunnel syndrome   Location: [facility] OR ROOM 03 / [facility]   Surgeons: Claudene Penne LELON, MD        CLINICAL DISCUSSION: Monica Ray is a 78 y.o. female who is submitted for pre-surgical anesthesia review and clearance prior to her undergoing the above procedure. Patient has never been a smoker. Pertinent PMH includes: CAD, HFpEF, pulmonary hypertension, angina, aortic atherosclerosis, HTN, HLD, T2DM, CKD-III, DOE, OSAH (requires nocturnal PAP therapy), GERD (no daily Tx), anemia, OA, carpal tunnel syndrome, lumbar DDD with stenosis, cervical stenosis with associated myelopathy (s/p ACDF C4-6), depression.   Patient is followed by cardiology Marsa, MD). She was last seen in the cardiology clinic on ***; notes reviewed. ***At the time of her clinic visit, patient  doing well overall from a cardiovascular perspective. Patient denied any chest pain, shortness of breath, PND, orthopnea, palpitations, significant peripheral edema, weakness, fatigue, vertiginous symptoms, or presyncope/syncope. Patient with a past medical history significant for cardiovascular diagnoses. Documented physical exam was grossly benign, providing no evidence of acute exacerbation and/or decompensation of the patient's known cardiovascular conditions.  ***  Blood pressure***controlled at *** mmHg on currently prescribed *** therapies.  Patient is on *** for her HLD diagnosis and ASCVD prevention. ***Patient is not diabetic. ***She does not have an OSAH diagnosis. ***FC. No changes were made to her medication regimen during her visit with cardiology.  Patient scheduled to follow-up with outpatient cardiology in***months or sooner if needed.  Monica Ray is scheduled for an elective CARPAL TUNNEL RELEASE (Right) on 09/19/2023 with Dr. Penne Claudene, MD.  Given patient's past medical history significant for cardiovascular diagnoses, presurgical cardiac clearance was sought by the PAT team. Per cardiology, based ACC/AHA guidelines, the patient's past medical history, and the amount of time since her last clinic visit, this patient would be at an overall ACCEPTABLE risk for the planned procedure without further cardiovascular testing or intervention at this time.   In review of her medication reconciliation, the patient is not noted to be taking any type of anticoagulation or antiplatelet therapies that would need to be held during her perioperative course.  Patient denies previous perioperative complications with anesthesia in the past. In review her EMR, it is noted that patient underwent a general anesthetic course here at [facility] (ASA III) in 05/2022 without documented complications.   MOST RECENT VITAL SIGNS:    08/23/2023   10:33 AM 08/16/2023     9:12 AM 05/16/2023    1:16 PM  Vitals with BMI  Height 5' 4 5' 4 5' 4  Weight 197 lbs 197 lbs 10 oz 191 lbs  BMI 33.8 33.9 32.77  Systolic 130 126 857  Diastolic 82 70 70  Pulse  67    PROVIDERS/SPECIALISTS: NOTE: Primary physician provider listed below. Patient may have been seen by APP or partner within same practice.   PROVIDER ROLE / SPECIALTY LAST OV  Claudene Penne ORN, MD Neurosurgery (Surgeon) 08/23/2023  Marikay Eva POUR, [place] Primary Care Provider 09/04/2023  Darron Grass, MD Cardiology 08/16/2023; preop APP call 09/09/2023  Pinkey Edman, MD  Nephrology 07/16/2023  Damian Setter, MD Endocrinology 08/22/2023   ALLERGIES: Allergies  Allergen Reactions   Tegaderm Ag Mesh [Silver] Rash   Amoxicillin Hives and Rash    Did it involve swelling of the face/tongue/throat, SOB, or low BP? No Did it involve sudden or severe rash/hives, skin peeling, or any reaction on the inside of your mouth or nose? No Did you need to seek medical attention at a hospital or doctor's office? No When did it last happen?  ~2015  If all above answers are "NO", may proceed with cephalosporin use.    Metformin Diarrhea and Nausea And Vomiting   Doxycycline Nausea Only   Oxycodone Itching   Tape Other (See Comments)    Caused burning sensation, Tagaderm    Azithromycin Rash and Itching   Clindamycin Hives and Rash   Fluconazole Itching   Simvastatin Other (See Comments)    Back pain    CURRENT HOME MEDICATIONS: No current facility-administered medications for this encounter.    acetaminophen  (TYLENOL ) 500 MG tablet   alendronate (FOSAMAX) 70 MG tablet   Cholecalciferol  (VITAMIN D3) 50 MCG (2000 UT) TABS   Cyanocobalamin  (VITAMIN B-12 PO)   DULoxetine  (CYMBALTA ) 30 MG capsule   Flaxseed, Linseed, (FLAXSEED OIL) 1200 MG CAPS   furosemide  (LASIX ) 40 MG tablet   insulin  glargine, 2 Unit Dial, (TOUJEO  MAX SOLOSTAR) 300 UNIT/ML Solostar Pen   insulin  lispro (HUMALOG KWIKPEN) 100  UNIT/ML KwikPen   rosuvastatin  (CRESTOR ) 40 MG tablet   tirzepatide (MOUNJARO) 2.5 MG/0.5ML Pen   triamcinolone cream (KENALOG) 0.1 %   TYRVAYA 0.03 MG/ACT SOLN   HISTORY: Past Medical History:  Diagnosis Date   Anemia    Anginal pain (HCC)    Aortic atherosclerosis (HCC)    Arthritis of both hands    Cervical spinal stenosis with myelopathy    a.) s/p ACDF C4-C6 05/16/2022   Chronic heart failure with preserved ejection fraction (HFpEF) (HCC)    a. 09/2021 Echo: EF 60-65%, no rwma, GrI DD, nl RV fxn, RVSP 47.71mmHg. Mildly dil LA; b. 10/2021 RHC: RA 15, RV 50/9, PA 48/21 (32), PCWP 21, LVEDP 27.   CKD (chronic kidney disease), stage III (HCC)    DDD (degenerative disc disease), lumbar    Depression    Diabetes mellitus, type 2 (HCC)    DM retinopathy (HCC)    Dyspnea on exertion    Edema    GERD (gastroesophageal reflux disease)    History of bilateral cataract extraction 2021   Hx of phlebitis    Hyperlipidemia    Hypertension    Lumbar stenosis    Mild allergic rhinitis    MRSA (methicillin resistant staph aureus) culture positive 03/2006   Nonobstructive CAD (coronary artery disease)    a. 12/2015 Cath: LAD min irregs, otw nl cors; b. 08/2018 Cor CTA: Mid/Distal LAD and OM1 dzs w/ abnl ctFFR; c. 09/2018 Cath: LM nl, LAD 40p, 92m, D1  142  Diastolic 82 70 70  Pulse  67    PROVIDERS/SPECIALISTS: NOTE: Primary physician provider listed below. Patient may have been seen by APP or partner within same practice.   PROVIDER ROLE / SPECIALTY LAST OV  Claudene Penne ORN, MD Neurosurgery (Surgeon) 08/23/2023  Marikay Eva POUR, [place] Primary Care Provider 09/04/2023  Darron Grass, MD Cardiology 08/16/2023; preop APP call 09/09/2023  Pinkey Edman, MD  Nephrology 07/16/2023  Damian Setter, MD Endocrinology 08/22/2023   ALLERGIES: Allergies  Allergen Reactions   Tegaderm Ag Mesh [Silver] Rash   Amoxicillin Hives and Rash    Did it involve swelling of the face/tongue/throat, SOB, or low BP? No Did it involve sudden or severe rash/hives, skin peeling, or any reaction on the inside of your mouth or nose? No Did you need to seek medical attention at a hospital or doctor's office? No When did it last happen?  ~2015  If all above answers are "NO", may proceed with cephalosporin use.    Metformin Diarrhea and Nausea And Vomiting   Doxycycline Nausea Only   Oxycodone Itching   Tape Other (See Comments)    Caused burning sensation, Tagaderm    Azithromycin Rash and Itching   Clindamycin Hives and Rash   Fluconazole Itching   Simvastatin Other (See Comments)    Back pain    CURRENT HOME MEDICATIONS: No current facility-administered medications for this encounter.    acetaminophen  (TYLENOL ) 500 MG tablet   alendronate (FOSAMAX) 70 MG tablet   Cholecalciferol  (VITAMIN D3) 50 MCG (2000 UT) TABS   Cyanocobalamin  (VITAMIN B-12 PO)   DULoxetine  (CYMBALTA ) 30 MG capsule   Flaxseed, Linseed, (FLAXSEED OIL) 1200 MG CAPS   furosemide  (LASIX ) 40 MG tablet   insulin  glargine, 2 Unit Dial, (TOUJEO  MAX SOLOSTAR) 300 UNIT/ML Solostar Pen   insulin  lispro (HUMALOG KWIKPEN) 100 UNIT/ML KwikPen   rosuvastatin  (CRESTOR ) 40 MG tablet   tirzepatide (MOUNJARO) 2.5 MG/0.5ML Pen   triamcinolone cream (KENALOG) 0.1 %   TYRVAYA 0.03 MG/ACT SOLN   HISTORY: Past Medical History:  Diagnosis Date   Anemia    Anginal pain (HCC)    Aortic atherosclerosis (HCC)    Arthritis of both hands    Carpal tunnel syndrome    Cervical spinal stenosis with myelopathy     a.) s/p ACDF C4-C6 05/16/2022   Chronic heart failure with preserved ejection fraction (HFpEF) (HCC)    a. 09/2021 Echo: EF 60-65%, no rwma, GrI DD, nl RV fxn, RVSP 47.69mmHg. Mildly dil LA; b. 10/2021 RHC: RA 15, RV 50/9, PA 48/21 (32), PCWP 21, LVEDP 27.   CKD (chronic kidney disease), stage III (HCC)    DDD (degenerative disc disease), lumbar    Depression    Diabetes mellitus, type 2 (HCC)    DM retinopathy (HCC)    Dyspnea on exertion    Edema    GERD (gastroesophageal reflux disease)    History of bilateral cataract extraction 2021   Hx of phlebitis    Hyperlipidemia    Hypertension    Lumbar stenosis    Mild allergic rhinitis    MRSA (methicillin resistant staph aureus) culture positive 03/2006   Nonobstructive CAD (coronary artery disease)    a. 12/2015 Cath: LAD min irregs, otw nl cors; b. 08/2018 Cor CTA: Mid/Distal LAD and OM1 dzs w/ abnl ctFFR; c. 09/2018 Cath: LM nl, LAD 40p, 71m, D1 50, LCX small-nl, RCA nl, EF 55-65%; c. 10/2021 Cath: LM nl, LAD 40p/85m, lat D1 60, LCX sm  nl, OM1 nl, RCA large, nl, RPDA/PAV/PL1-3 nl.   NSVT (nonsustained ventricular tachycardia) (HCC)    Obesity    OSA on CPAP    Osteoporosis    a.) on oral bisphosphonate (alendronateon)   Pulmonary HTN (HCC)    a.) R/LHC 12/08/2015: mPA 25, mPCWP 15, AO sat 93.5, CO 6.69, CI 3.04; b.) TTE 09/21/2021: RVSP 47.8; c.) R/LHC 10/23/2021: mPA 32, mPCWP 21, PA sat 73, CO 11.5   Past Surgical History:  Procedure Laterality Date   ABDOMINAL HYSTERECTOMY     ADENOIDECTOMY     ANTERIOR CERVICAL DECOMP/DISCECTOMY FUSION N/A 05/16/2022   Procedure: C4-6 ANTERIOR CERVICAL DISCECTOMY AND FUSION;  Surgeon: Clois Fret, MD;  Location: [facility] ORS;  Service: Neurosurgery;  Laterality: N/A;   APPENDECTOMY  1989   CARDIAC CATHETERIZATION Bilateral 12/08/2015   Procedure: Right/Left Heart Cath and Coronary Angiography;  Surgeon: Deatrice DELENA Cage, MD;  Location: [facility];  Service: Cardiovascular;   Laterality: Bilateral;   CATARACT EXTRACTION W/PHACO Left 05/12/2019   Procedure: CATARACT EXTRACTION PHACO AND INTRAOCULAR LENS PLACEMENT (IOC) LEFT VISION BLUE;  Surgeon: Ferol Rogue, MD;  Location: [facility];  Service: Ophthalmology;  Laterality: Left;  12.80 1:17.1   CATARACT EXTRACTION W/PHACO Right 06/11/2019   Procedure: CATARACT EXTRACTION PHACO AND INTRAOCULAR LENS PLACEMENT (IOC) RIGHT DIABETIC VISION BLUE 12.91  01:21.2;  Surgeon: Ferol Rogue, MD;  Location: [facility];  Service: Ophthalmology;  Laterality: Right;  Diabetic - insulin  and oral meds   CHOLECYSTECTOMY  1989   LEFT HEART CATH AND CORONARY ANGIOGRAPHY Left 09/15/2018   Procedure: LEFT HEART CATH AND CORONARY ANGIOGRAPHY;  Surgeon: Cage Deatrice DELENA, MD;  Location: [facility];  Service: Cardiovascular;  Laterality: Left;   LUMBAR LAMINECTOMY/DECOMPRESSION MICRODISCECTOMY N/A 11/25/2014   Procedure: LUMBAR LAMINECTOMY/DECOMPRESSION MICRODISCECTOMY LUMBAR TWO-THREE, LUMBAR THREE-FOUR ;  Surgeon: Reyes Budge, MD;  Location: [facility];  Service: Neurosurgery;  Laterality: N/A;   RIGHT/LEFT HEART CATH AND CORONARY ANGIOGRAPHY Bilateral 10/23/2021   Procedure: RIGHT/LEFT HEART CATH AND CORONARY ANGIOGRAPHY;  Surgeon: Cage Deatrice DELENA, MD;  Location: [facility];  Service: Cardiovascular;  Laterality: Bilateral;   ROTATOR CUFF REPAIR Right 08/2001   Family History  Problem Relation Age of Onset   Cancer Mother 20       lung   Diabetes Mother    Alcohol abuse Mother    Alcohol abuse Father    Cancer Father        throat   Aneurysm Father    Diabetes Maternal Grandmother    Aneurysm Paternal Grandmother    Breast cancer Paternal Aunt        54's   Cancer Sister    Social History   Tobacco Use   Smoking status: Never   Smokeless tobacco: Never  Substance Use Topics   Alcohol use: No   LABS:   Component Ref Range & Units 08/28/2023  WBC (White Blood Cell  Count) 4.1 - 10.2 10^3/uL 3.9 Low   RBC (Red Blood Cell Count) 4.04 - 5.48 10^6/uL 4.50  Hemoglobin 12.0 - 15.0 gm/dL 85.7  Hematocrit 64.9 - 47.0 % 43.2  MCV (Mean Corpuscular Volume) 80.0 - 100.0 fl 96.0  MCH (Mean Corpuscular Hemoglobin) 27.0 - 31.2 pg 31.6 High   MCHC (Mean Corpuscular Hemoglobin Concentration) 32.0 - 36.0 gm/dL 67.0  Platelet Count 849 - 450 10^3/uL 255  RDW-CV (Red Cell Distribution Width) 11.6 - 14.8 % 12.7  MPV (Mean Platelet Volume) 9.4 - 12.4 fl  normal in size with greater than 50% respiratory variability, suggesting right atrial pressure of 3 mmHg.   MR LUMBAR SPINE WO CONTRAST performed on 03/19/2022 Transitional anatomy, as above Multilevel degenerative changes of the lumbar spine, progressed at L1-L2 where there is severe canal stenosis with severe left and moderate right foraminal stenosis. Severe right and moderate-severe left foraminal stenosis at L2-L3. Moderate-severe bilateral foraminal stenosis at L3-L4.   MR CERVICAL SPINE WO CONTRAST performed on 03/19/2022 Progressive multilevel cervical spondylosis, most pronounced at the C4-5 and C5-6 levels where there is severe canal stenosis. Mild cord compression at C4-5. Severe right and moderate left foraminal stenosis at C5-6.   RIGHT/LEFT HEART CATHETERIZATION performed on 10/23/2021 Left ventricular angiography was not performed.  EF by echo was normal. Nonobstructive coronary artery disease 40% proximal LAD 30% mid LAD 50% lateral D1 Hemodynamics RA: 15 mmHg RV: 50/9 mmHg PW: 21 mmHg PA: 48/21 with a mean of 32 mmHg Pulmonary vascular resistance is less than 1 Woods unit. LVEDP: 27 mmHg PA sat was 73% with a cardiac output of 11.5. Recommendations Continue medical therapy for nonobstructive coronary artery disease. Pulmonary hypertension is due to chronic diastolic heart failure. Elected to add spironolactone  25 mg once daily.  Continue furosemide  20 mg once daily.  Monitor renal function closely. Consider an SGLT2 inhibitor.     CT CORONARY MORPH W/CTA COR W/SCORE W/CA W/CM &/OR WO/CM AND FRACTIONAL FLOW RESERVE DATA PREP performed on 09/02/2018 Calcium  score 929 which is 96th percentile for age and sex involving RCA and LAD CAD-RADS 3 CAD see  description above with worst lesions involving the proximal and mid LAD.  Severe mitral annular calcification Normal aortic root 3.2 cm with calcific atherosclerosis FFR analysis (normal range > 0.80): FFR CT abnormal in mid LAD 0.81 and distal LAD 0.71 FFR CT abnormal in OM1 0.77 FFR CT normal in RCA   IMPRESSION AND PLAN: Monica Ray has been referred for pre-anesthesia review and clearance prior to her undergoing the planned anesthetic and procedural courses. Available labs, pertinent testing, and imaging results were personally reviewed by me in preparation for upcoming operative/procedural course. [facility] medical record has been updated following extensive record review and patient interview with PAT staff.   ATTENTION --> PENDING CLEARANCE AT THIS TIME -- NOTE/CONTENTS NOT FINAL UNTIL SIGNED This patient has been appropriately cleared by cardiology with an overall *** risk of patient experiencing significant perioperative cardiovascular complications. Based on clinical review performed today (09/17/23), barring any significant acute changes in the patient's overall condition, it is anticipated that she will be able to proceed with the planned surgical intervention. Any acute changes in clinical condition may necessitate her procedure being postponed and/or cancelled. Patient will meet with anesthesia team (MD and/or CRNA) on the day of her procedure for preoperative evaluation/assessment. Questions regarding anesthetic course will be fielded at that time.   Pre-surgical instructions were reviewed with the patient during his PAT appointment, and questions were fielded to satisfaction by PAT clinical staff. She has been instructed on which medications that she will need to hold prior to surgery, as well as the ones that have been deemed safe/appropriate to take on the day of her procedure. As part of the general education provided by PAT, patient made aware both verbally and in writing,  that she would need to abstain from the use of any illegal substances during her perioperative course. She was advised that failure to follow the provided instructions could necessitate case cancellation or result in  right atrial pressure of 3 mmHg.   MR LUMBAR SPINE WO CONTRAST performed on 03/19/2022 Transitional anatomy, as above Multilevel degenerative changes of the lumbar spine, progressed at L1-L2 where there is severe canal stenosis with severe left and moderate right foraminal stenosis. Severe right and moderate-severe left foraminal stenosis at L2-L3. Moderate-severe bilateral foraminal stenosis at L3-L4.   MR CERVICAL SPINE WO CONTRAST performed on 03/19/2022 Progressive multilevel cervical spondylosis, most pronounced at the C4-5 and C5-6 levels where there is severe canal stenosis. Mild cord compression at C4-5. Severe right and moderate left foraminal stenosis at C5-6.   RIGHT/LEFT HEART CATHETERIZATION performed on 10/23/2021 Left ventricular angiography was not performed.  EF by echo was normal. Nonobstructive coronary artery disease 40% proximal LAD 30% mid LAD 50% lateral D1 Hemodynamics RA: 15 mmHg RV: 50/9 mmHg PW: 21 mmHg PA: 48/21 with a mean of 32 mmHg Pulmonary vascular resistance is less than 1 Woods unit. LVEDP: 27 mmHg PA sat was 73% with a cardiac output of 11.5. Recommendations Continue medical therapy for nonobstructive coronary artery disease. Pulmonary hypertension is due to chronic diastolic heart failure. Elected to add spironolactone  25 mg once daily.  Continue furosemide  20 mg once daily.  Monitor renal function closely. Consider an SGLT2 inhibitor.     CT CORONARY MORPH W/CTA COR W/SCORE W/CA W/CM &/OR WO/CM AND FRACTIONAL FLOW RESERVE DATA PREP performed on 09/02/2018 Calcium  score 929 which is 96th percentile for age and sex involving RCA and LAD CAD-RADS 3 CAD see description above with worst lesions involving the proximal and mid LAD.  Severe mitral annular calcification Normal aortic root 3.2 cm with calcific atherosclerosis FFR analysis (normal range > 0.80): FFR CT abnormal in mid LAD 0.81 and distal LAD 0.71 FFR CT abnormal in OM1 0.77 FFR CT  normal in RCA   IMPRESSION AND PLAN: Monica Ray has been referred for pre-anesthesia review and clearance prior to her undergoing the planned anesthetic and procedural courses. Available labs, pertinent testing, and imaging results were personally reviewed by me in preparation for upcoming operative/procedural course. [facility] medical record has been updated following extensive record review and patient interview with PAT staff.   This patient has been appropriately cleared by cardiology with an overall ACCEPTABLE risk of patient experiencing significant perioperative cardiovascular complications. Based on clinical review performed today (09/17/23), barring any significant acute changes in the patient's overall condition, it is anticipated that she will be able to proceed with the planned surgical intervention. Any acute changes in clinical condition may necessitate her procedure being postponed and/or cancelled. Patient will meet with anesthesia team (MD and/or CRNA) on the day of her procedure for preoperative evaluation/assessment. Questions regarding anesthetic course will be fielded at that time.   Pre-surgical instructions were reviewed with the patient during his PAT appointment, and questions were fielded to satisfaction by PAT clinical staff. She has been instructed on which medications that she will need to hold prior to surgery, as well as the ones that have been deemed safe/appropriate to take on the day of her procedure. As part of the general education provided by PAT, patient made aware both verbally and in writing, that she would need to abstain from the use of any illegal substances during her perioperative course. She was advised that failure to follow the provided instructions could necessitate case cancellation or result in serious perioperative complications up to and including death. Patient encouraged to contact PAT and/or her surgeon's office to discuss any questions or  concerns  that may arise prior to surgery; verbalized understanding.   Dorise Pereyra, MSN, APRN, FNP-C, CEN [facility]  Perioperative Services Nurse Practitioner Phone: [phone] Fax: [phone] 09/17/23 16:35 PM  NOTE: This note has been prepared using Dragon dictation software. Despite my best ability to proofread, there is always the potential that unintentional transcriptional errors may still occur from this process.

## 2023-09-17 NOTE — Progress Notes (Signed)
  Perioperative Services: Pre-Admission/Anesthesia Testing  Abnormal Lab Notification    Date: 09/17/23  Name: Monica VANCLEVE MRN:   [id number]  Re: Abnormal labs noted during PAT appointment   Provider(s) Notified: Claudene Penne LELON, MD Notification mode: Routed and/or faxed via [facility]  Planned procedure: CARPAL TUNNEL RELEASE (Right)   ABNORMAL LAB VALUE(S): Lab Results  Component Value Date   GLUCOSE 154 (H) 12/21/2022    Notes: Patient with a T2DM diagnosis. She is currently on  multiple  parenteral therapies including Toujeo , Humalog, and Mounjaro. Last Hgb A1c was 8.1% on 08/28/2023. In efforts to reduce the risk of developing SSI, or other potential perioperative complications, this communication is being sent in order to determine if patient is deemed to be adequately optimized  for surgery.   In light of the aforementioned A1c, her diabetes could pose increased risks for the patient during their perioperative course and subsequent recovery period. With that being said, the benefit of improving glycemic control must be weighed against the overall risks associated with delaying a necessary elective surgical procedure for this patient.   This is a preoperative FYI. No formal response indicated if there are no proposed changes to patient's surgical plan of care. Should there be changes, please let me know.   Dorise Pereyra, MSN, APRN, FNP-C, CEN [facility]  Perioperative Services Nurse Practitioner Phone: [phone] Fax: [phone] 09/17/23 4:15 PM

## 2023-09-18 ENCOUNTER — Encounter: Payer: Self-pay | Admitting: Neurosurgery

## 2023-09-18 MED ORDER — CHLORHEXIDINE GLUCONATE 0.12 % MT SOLN
15.0000 mL | Freq: Once | OROMUCOSAL | Status: AC
  Administered 2023-09-19: 15 mL via OROMUCOSAL

## 2023-09-18 MED ORDER — CEFAZOLIN IN SODIUM CHLORIDE 2-0.9 GM/100ML-% IV SOLN
2.0000 g | Freq: Once | INTRAVENOUS | Status: DC
  Filled 2023-09-18: qty 100

## 2023-09-18 MED ORDER — CEFAZOLIN SODIUM-DEXTROSE 2-4 GM/100ML-% IV SOLN
2.0000 g | INTRAVENOUS | Status: AC
  Administered 2023-09-19: 2 g via INTRAVENOUS

## 2023-09-18 MED ORDER — SODIUM CHLORIDE 0.9 % IV SOLN: INTRAVENOUS | Status: DC

## 2023-09-18 MED ORDER — ORAL CARE MOUTH RINSE: 15.0000 mL | Freq: Once | OROMUCOSAL | Status: AC

## 2023-09-19 ENCOUNTER — Ambulatory Visit: Payer: Self-pay | Admitting: Urgent Care

## 2023-09-19 ENCOUNTER — Other Ambulatory Visit: Payer: Self-pay

## 2023-09-19 ENCOUNTER — Encounter: Payer: Self-pay | Admitting: Neurosurgery

## 2023-09-19 ENCOUNTER — Ambulatory Visit
Admission: RE | Admit: 2023-09-19 | Discharge: 2023-09-19 | Disposition: A | Discharge status: Home / Self Care | Attending: Neurosurgery | Admitting: Neurosurgery

## 2023-09-19 ENCOUNTER — Encounter
Admission: RE | Disposition: A | Discharge status: Home / Self Care | Payer: Self-pay | Source: Home / Self Care | Attending: Neurosurgery

## 2023-09-19 DIAGNOSIS — I5032 Chronic diastolic (congestive) heart failure: Secondary | ICD-10-CM | POA: Insufficient documentation

## 2023-09-19 DIAGNOSIS — E66813 Obesity, class 3: Secondary | ICD-10-CM | POA: Diagnosis not present

## 2023-09-19 DIAGNOSIS — N183 Chronic kidney disease, stage 3 unspecified: Secondary | ICD-10-CM | POA: Diagnosis not present

## 2023-09-19 DIAGNOSIS — G5601 Carpal tunnel syndrome, right upper limb: Secondary | ICD-10-CM | POA: Diagnosis not present

## 2023-09-19 DIAGNOSIS — G4733 Obstructive sleep apnea (adult) (pediatric): Secondary | ICD-10-CM | POA: Diagnosis not present

## 2023-09-19 DIAGNOSIS — Z6833 Body mass index (BMI) 33.0-33.9, adult: Secondary | ICD-10-CM | POA: Diagnosis not present

## 2023-09-19 DIAGNOSIS — E1122 Type 2 diabetes mellitus with diabetic chronic kidney disease: Secondary | ICD-10-CM | POA: Insufficient documentation

## 2023-09-19 DIAGNOSIS — K219 Gastro-esophageal reflux disease without esophagitis: Secondary | ICD-10-CM | POA: Insufficient documentation

## 2023-09-19 DIAGNOSIS — I272 Pulmonary hypertension, unspecified: Secondary | ICD-10-CM | POA: Insufficient documentation

## 2023-09-19 DIAGNOSIS — F32A Depression, unspecified: Secondary | ICD-10-CM | POA: Insufficient documentation

## 2023-09-19 DIAGNOSIS — E119 Type 2 diabetes mellitus without complications: Secondary | ICD-10-CM

## 2023-09-19 DIAGNOSIS — M1990 Unspecified osteoarthritis, unspecified site: Secondary | ICD-10-CM | POA: Insufficient documentation

## 2023-09-19 DIAGNOSIS — Z794 Long term (current) use of insulin: Secondary | ICD-10-CM | POA: Diagnosis not present

## 2023-09-19 DIAGNOSIS — G709 Myoneural disorder, unspecified: Secondary | ICD-10-CM | POA: Diagnosis not present

## 2023-09-19 DIAGNOSIS — Z7985 Long-term (current) use of injectable non-insulin antidiabetic drugs: Secondary | ICD-10-CM | POA: Insufficient documentation

## 2023-09-19 DIAGNOSIS — G959 Disease of spinal cord, unspecified: Secondary | ICD-10-CM | POA: Insufficient documentation

## 2023-09-19 DIAGNOSIS — I130 Hypertensive heart and chronic kidney disease with heart failure and stage 1 through stage 4 chronic kidney disease, or unspecified chronic kidney disease: Secondary | ICD-10-CM | POA: Insufficient documentation

## 2023-09-19 DIAGNOSIS — Z01818 Encounter for other preprocedural examination: Secondary | ICD-10-CM

## 2023-09-19 DIAGNOSIS — G5603 Carpal tunnel syndrome, bilateral upper limbs: Secondary | ICD-10-CM | POA: Diagnosis present

## 2023-09-19 DIAGNOSIS — I25119 Atherosclerotic heart disease of native coronary artery with unspecified angina pectoris: Secondary | ICD-10-CM | POA: Insufficient documentation

## 2023-09-19 DIAGNOSIS — Z981 Arthrodesis status: Secondary | ICD-10-CM | POA: Insufficient documentation

## 2023-09-19 HISTORY — PX: CARPAL TUNNEL RELEASE: SHX101

## 2023-09-19 HISTORY — DX: Carpal tunnel syndrome, unspecified upper limb: G56.00

## 2023-09-19 HISTORY — DX: Primary osteoarthritis, right hand: M19.042

## 2023-09-19 HISTORY — DX: Age-related osteoporosis without current pathological fracture: M81.0

## 2023-09-19 HISTORY — DX: Primary osteoarthritis, right hand: M19.041

## 2023-09-19 HISTORY — DX: Other ventricular tachycardia: I47.29

## 2023-09-19 LAB — GLUCOSE, CAPILLARY
Glucose-Capillary: 225 mg/dL — ABNORMAL HIGH (ref 70–99)
Glucose-Capillary: 252 mg/dL — ABNORMAL HIGH (ref 70–99)

## 2023-09-19 SURGERY — CARPAL TUNNEL RELEASE
Anesthesia: General | Laterality: Right

## 2023-09-19 MED ORDER — 0.9 % SODIUM CHLORIDE (POUR BTL) OPTIME
TOPICAL | Status: DC | PRN
  Administered 2023-09-19: 150 mL

## 2023-09-19 MED ORDER — LIDOCAINE HCL (PF) 2 % IJ SOLN
INTRAMUSCULAR | Status: AC
  Filled 2023-09-19: qty 5

## 2023-09-19 MED ORDER — LIDOCAINE HCL (PF) 1 % IJ SOLN
INTRAMUSCULAR | Status: AC
  Filled 2023-09-19: qty 60

## 2023-09-19 MED ORDER — INSULIN ASPART 100 UNIT/ML IJ SOLN
INTRAMUSCULAR | Status: AC
  Filled 2023-09-19: qty 1

## 2023-09-19 MED ORDER — PROPOFOL 500 MG/50ML IV EMUL
INTRAVENOUS | Status: DC | PRN
  Administered 2023-09-19: 60 mg via INTRAVENOUS
  Administered 2023-09-19: 60 ug/kg/min via INTRAVENOUS
  Administered 2023-09-19: 30 mg via INTRAVENOUS

## 2023-09-19 MED ORDER — CHLORHEXIDINE GLUCONATE 0.12 % MT SOLN
OROMUCOSAL | Status: AC
  Filled 2023-09-19: qty 15

## 2023-09-19 MED ORDER — LIDOCAINE HCL 1 % IJ SOLN
INTRAMUSCULAR | Status: DC | PRN
  Administered 2023-09-19: 5 mL

## 2023-09-19 MED ORDER — INSULIN ASPART 100 UNIT/ML IJ SOLN
6.0000 [IU] | Freq: Once | INTRAMUSCULAR | Status: AC
  Administered 2023-09-19: 6 [IU] via SUBCUTANEOUS

## 2023-09-19 MED ORDER — CEFAZOLIN SODIUM-DEXTROSE 2-4 GM/100ML-% IV SOLN
INTRAVENOUS | Status: AC
Start: 2023-09-19 — End: 2023-09-19
  Filled 2023-09-19: qty 100

## 2023-09-19 MED ORDER — DEXMEDETOMIDINE HCL IN NACL 80 MCG/20ML IV SOLN
INTRAVENOUS | Status: DC | PRN
  Administered 2023-09-19 (×4): 4 ug via INTRAVENOUS

## 2023-09-19 MED ORDER — PROPOFOL 1000 MG/100ML IV EMUL
INTRAVENOUS | Status: AC
  Filled 2023-09-19: qty 100

## 2023-09-19 MED ORDER — LIDOCAINE HCL (CARDIAC) PF 100 MG/5ML IV SOSY
PREFILLED_SYRINGE | INTRAVENOUS | Status: DC | PRN
  Administered 2023-09-19: 50 mg via INTRAVENOUS

## 2023-09-19 MED ORDER — HYDROCODONE-ACETAMINOPHEN 5-325 MG PO TABS
1.0000 | ORAL_TABLET | Freq: Four times a day (QID) | ORAL | 0 refills | Status: AC | PRN
  Filled 2023-09-19: qty 20, 5d supply, fill #0

## 2023-09-19 MED ORDER — PROPOFOL 10 MG/ML IV BOLUS
INTRAVENOUS | Status: AC
  Filled 2023-09-19: qty 20

## 2023-09-19 MED ORDER — BUPIVACAINE-EPINEPHRINE (PF) 0.5% -1:200000 IJ SOLN
INTRAMUSCULAR | Status: AC
  Filled 2023-09-19: qty 10

## 2023-09-19 MED ORDER — DEXMEDETOMIDINE HCL IN NACL 80 MCG/20ML IV SOLN
INTRAVENOUS | Status: AC
  Filled 2023-09-19: qty 20

## 2023-09-19 SURGICAL SUPPLY — 20 items
BNDG ADH 1X3 SHEER STRL LF (GAUZE/BANDAGES/DRESSINGS) ×1 IMPLANT
BNDG ADH 2 X3.75 FABRIC TAN LF (GAUZE/BANDAGES/DRESSINGS) IMPLANT
BRUSH SCRUB EZ 4% CHG (MISCELLANEOUS) ×1 IMPLANT
CHLORAPREP W/TINT 26 (MISCELLANEOUS) ×1 IMPLANT
COVER PROBE FLX POLY STRL (MISCELLANEOUS) ×1 IMPLANT
DERMABOND ADVANCED .7 DNX12 (GAUZE/BANDAGES/DRESSINGS) ×1 IMPLANT
DRAPE EXTREMITY 106X87X128.5 (DRAPES) ×1 IMPLANT
DRAPE IMP U-DRAPE 54X76 (DRAPES) ×1 IMPLANT
DRAPE SHEET LG 3/4 BI-LAMINATE (DRAPES) ×1 IMPLANT
GLOVE BIOGEL PI IND STRL 8 (GLOVE) ×1 IMPLANT
GLOVE SRG 8 PF TXTR STRL LF DI (GLOVE) ×1 IMPLANT
GLOVE SURG SYN 7.5 PF PI (GLOVE) ×1 IMPLANT
GOWN SRG XL LVL 3 NONREINFORCE (GOWNS) ×1 IMPLANT
KIT TURNOVER KIT A (KITS) ×1 IMPLANT
NDL HYPO 25X1 1.5 SAFETY (NEEDLE) ×1 IMPLANT
NEEDLE HYPO 25X1 1.5 SAFETY (NEEDLE) ×1 IMPLANT
NS IRRIG 500ML POUR BTL (IV SOLUTION) ×1 IMPLANT
PACK BASIN MINOR ARMC (MISCELLANEOUS) ×1 IMPLANT
STOCKINETTE IMPERVIOUS 9X36 MD (GAUZE/BANDAGES/DRESSINGS) ×1 IMPLANT
ULTRAGLIDE CTR (BLADE) ×1 IMPLANT

## 2023-09-19 NOTE — Op Note (Signed)
 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  Findings: Severe compression of the median nerve at the transverse carpal ligament  Preoperative Diagnosis:G56.01 Right carpal tunnel syndrome  Postoperative Diagnosis: G56.01 Right carpal tunnel syndrome   Postoperative Diagnosis: same   EBL: Minimal IVF: See anesthesia report Drains: none Disposition:Stable to PACU Complications: none  No foley catheter was placed.   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  They had tried rest, padding, and watchful waiting but had continued progressive symptoms.  Given the progression of her median neuropathy plan was made for median nerve decompression  Risk of surgery is discussed and include: Infection, bleeding, wound healing issues, pillar pain nerve injury, pain, failure to relieve the symptoms, need for further surgery.  Procedure:  1) right sided carpal tunnel decompression with ultrasound guidance   Procedure: After obtaining informed consent, the patient taken to the operating room, placed in supine position, monitored anesthesia care was induced.  They were given preoperative antibiotics.  Prepped and draped in the usual fashion.  Comprehensive timeout was performed verifying the patient's name, MRN, planned procedure.  An ultrasound was used with a sterile probe.  We used this to mark out our safety points including the interval between the ulnar artery and median nerve.  We identified the motor branch as well as the first sensory branch which were both in safe position.  We also identified the vascular arcade which was in safe positioning as well.  At this point we placed a block with Marcaine  without epinephrine .  We blocked the skin where the incision would be, the superficial sensory median nerve, as well as  the transverse carpal ligament.  Under ultrasound guidance we utilized the local anesthetic to perform a hydrodissection of the nerve from the transverse carpal ligament.  We then prepped the sonexs ultra CTR knife on the back table while the anesthetic set in.  We performed a small linear incision approximately 2 to 3 mm.  We then utilized a Statistician under ultrasound guidance to identify the underside of the transverse carpal ligament.  The fat and connective tissue was dissected off the underside.  We could feel that this was quite thickened and calcified.  Causing severe compression.  Once we had a clear tract we then placed the ultrasound-guided knife into the incision and advanced it through the carpal tunnel.  We verified the safety zones including the median nerve which did not significantly cross over the knife.  We are able to see the first sensory branch which was not crossing the knife.  The artery was also in a safe place.  At this point we divided the transverse carpal ligament under ultrasound guidance, to get a full release it took 2 passes.  The nerve relaxed laterally and was well decompressed.  After the 2 passes we placed a Penfield 4 into the wound and were able to feel a complete dissection of the transverse carpal ligament.  We then irrigated, we got meticulous hemostasis.  Skin glue was placed on the incision and a Band-Aid was placed on top once this was dried.  No immediate complications.  Sponge and pattie counts were correct at the end of the procedure.   I performed the procedure without an assistant surgeon  Penne MICAEL Sharps, MD/MSCR

## 2023-09-19 NOTE — Discharge Instructions (Addendum)

## 2023-09-19 NOTE — Anesthesia Preprocedure Evaluation (Addendum)
 Anesthesia Evaluation  Patient identified by MRN, date of birth, ID band Patient awake    Reviewed: NPO status   History of Anesthesia Complications Negative for: history of anesthetic complications  Airway Mallampati: IV  TM Distance: >3 FB Neck ROM: full    Dental  (+) Caps   Pulmonary sleep apnea and Continuous Positive Airway Pressure Ventilation    Pulmonary exam normal        Cardiovascular Exercise Tolerance: Good hypertension, pulmonary hypertension(-) angina + CAD  Normal cardiovascular exam  10/2021 Cath: LM nl, LAD 40p/52m, lat D1 60, LCX sm nl, OM1 nl, RCA large, nl, RPDA/PAV/PL1-3 nl.   09/2021 Echo: EF 60-65%, no rwma, GrI DD, nl RV fxn, RVSP 47.33mmHg. Mildly dil LA; b. 10/2021 RHC: RA 15, RV 50/9, PA 48/21 (32), PCWP 21, LVEDP 27.  Her functional capacity is good at 5.07 METs according to the Duke Activity Status Index (DASI) per cardiology visit report   Neuro/Psych  PSYCHIATRIC DISORDERS  Depression    Cervical myelopathy  Neuromuscular disease    GI/Hepatic Neg liver ROS,GERD  Controlled,,  Endo/Other  diabetes, Well Controlled, Type 2  Class 3 obesity  Renal/GU Renal InsufficiencyRenal disease  negative genitourinary   Musculoskeletal  (+) Arthritis ,    Abdominal  (+) + obese  Peds  Hematology negative hematology ROS (+)   Anesthesia Other Findings      Reproductive/Obstetrics                              Anesthesia Physical Anesthesia Plan  ASA: III  Anesthesia Plan: General   Post-op Pain Management: Regional block*   Induction: Intravenous  PONV Risk Score and Plan: 2 and Dexamethasone , Ondansetron  and TIVA  Airway Management Planned: Natural Airway  Additional Equipment:   Intra-op Plan:   Post-operative Plan:   Informed Consent: I have reviewed the patients History and Physical, chart, labs and discussed the procedure including the risks, benefits  and alternatives for the proposed anesthesia with the patient or authorized representative who has indicated his/her understanding and acceptance.       Plan Discussed with: CRNA  Anesthesia Plan Comments:          Anesthesia Quick Evaluation

## 2023-09-19 NOTE — Transfer of Care (Signed)
 Immediate Anesthesia Transfer of Care Note  Patient: Monica Ray  Procedure(s) Performed: CARPAL TUNNEL RELEASE (Right)  Patient Location: PACU  Anesthesia Type:General  Level of Consciousness: drowsy and patient cooperative  Airway & Oxygen Therapy: Patient Spontanous Breathing and Patient connected to nasal cannula oxygen  Post-op Assessment: Report given to RN and Post -op Vital signs reviewed and stable  Post vital signs: Reviewed and stable  Last Vitals:  Vitals Value Taken Time  BP 93/51 09/19/23 07:47  Temp    Pulse 56 09/19/23 07:53  Resp 18 09/19/23 07:53  SpO2 96 % 09/19/23 07:53  Vitals shown include unfiled device data.  Last Pain:  Vitals:   09/19/23 0616  TempSrc: Temporal  PainSc: 6          Complications: No notable events documented.

## 2023-09-19 NOTE — Interval H&P Note (Signed)
 History and Physical Interval Note:  09/19/2023 6:57 AM  Monica Ray  has presented today for surgery, with the diagnosis of G56.01 Right carpal tunnel syndrome.  The various methods of treatment have been discussed with the patient and family. After consideration of risks, benefits and other options for treatment, the patient has consented to  Procedure(s) with comments: CARPAL TUNNEL RELEASE (Right) - RIGHT CARPAL TUNNEL RELEASE WITH ULTRASOUND GUIDANCE as a surgical intervention.  The patient's history has been reviewed, patient examined, no change in status, stable for surgery.  I have reviewed the patient's chart and labs.  Questions were answered to the patient's satisfaction.    Heart and lungs clear   Monica Ray

## 2023-09-19 NOTE — Anesthesia Postprocedure Evaluation (Signed)
 Anesthesia Post Note  Patient: Monica Ray  Procedure(s) Performed: CARPAL TUNNEL RELEASE (Right)  Patient location during evaluation: PACU Anesthesia Type: General Level of consciousness: awake and alert Pain management: pain level controlled Vital Signs Assessment: post-procedure vital signs reviewed and stable Respiratory status: spontaneous breathing, nonlabored ventilation and respiratory function stable Cardiovascular status: blood pressure returned to baseline and stable Postop Assessment: no apparent nausea or vomiting Anesthetic complications: no   No notable events documented.   Last Vitals:  Vitals:   09/19/23 0841 09/19/23 0845  BP: 138/60   Pulse: (!) 54   Resp: 15   Temp:  (!) 36.1 C  SpO2: 98%     Last Pain:  Vitals:   09/19/23 0845  TempSrc: Temporal  PainSc:                  [name]

## 2023-09-20 ENCOUNTER — Encounter: Payer: Self-pay | Admitting: Neurosurgery

## 2023-10-02 ENCOUNTER — Ambulatory Visit (INDEPENDENT_AMBULATORY_CARE_PROVIDER_SITE_OTHER): Admitting: Physician Assistant

## 2023-10-02 ENCOUNTER — Encounter: Payer: Self-pay | Admitting: Physician Assistant

## 2023-10-02 VITALS — BP 124/72 | Ht 64.0 in | Wt 197.0 lb

## 2023-10-02 DIAGNOSIS — Z09 Encounter for follow-up examination after completed treatment for conditions other than malignant neoplasm: Secondary | ICD-10-CM

## 2023-10-02 DIAGNOSIS — G5601 Carpal tunnel syndrome, right upper limb: Secondary | ICD-10-CM

## 2023-10-02 NOTE — Progress Notes (Signed)
   REFERRING PHYSICIAN:  Mclaughlin, Miriam K, Pa [address] [facility] [place],  [place] [address]  DOS: 09/19/2023, right carpal tunnel release  HISTORY OF PRESENT ILLNESS: Monica Ray is 2 weeks status post right ultrasound-guided carpal tunnel release. Overall, she is doing very well.  Her numbness and tingling is gone.  PHYSICAL EXAMINATION:  NEUROLOGICAL:  General: In no acute distress.   Awake, alert, oriented to person, place, and time.  Pupils equal round and reactive to light.  Facial tone is symmetric.    Strength: Strength is to baseline.  Incision c/d/I  Imaging:  No new imaging  Assessment / Plan: Monica Ray is doing well after right ultrasound-guided carpal tunnel release 2 weeks ago.  She no longer has numbness and tingling.  Plan to see back in approximately 1 month for her 6-week postop visit.   Advised to contact the office if any questions or concerns arise.   Lyle Decamp PA-C Dept of Neurosurgery

## 2023-10-30 ENCOUNTER — Encounter: Payer: Self-pay | Admitting: Neurosurgery

## 2023-10-30 ENCOUNTER — Ambulatory Visit (INDEPENDENT_AMBULATORY_CARE_PROVIDER_SITE_OTHER): Admitting: Neurosurgery

## 2023-10-30 VITALS — BP 124/78 | Temp 98.3°F | Ht 64.0 in | Wt 195.4 lb

## 2023-10-30 DIAGNOSIS — G5601 Carpal tunnel syndrome, right upper limb: Secondary | ICD-10-CM

## 2023-10-30 DIAGNOSIS — Z09 Encounter for follow-up examination after completed treatment for conditions other than malignant neoplasm: Secondary | ICD-10-CM

## 2023-10-30 NOTE — Progress Notes (Signed)
   REFERRING PHYSICIAN:  Mclaughlin, Miriam K, Pa [address] [facility] [place],  [place] [address]  DOS: 09/19/2023, right carpal tunnel release  HISTORY OF PRESENT ILLNESS: PROVIDENCIA HOTTENSTEIN is 6 weeks status post right ultrasound-guided carpal tunnel release. Overall, she is doing very well.  Her numbness and tingling is gone.  Now that she is using her hand more she does notice that the tips of her index finger and middle finger have some sensation changes but this is not bothersome to her.  PHYSICAL EXAMINATION:  NEUROLOGICAL:  General: In no acute distress.   Awake, alert, oriented to person, place, and time.  Pupils equal round and reactive to light.  Facial tone is symmetric.    Strength: Strength is to baseline.  Incision c/d/I  Imaging:  No new imaging  Assessment / Plan: ARIBELLE MCCOSH is doing well after right ultrasound-guided carpal tunnel release 6 weeks ago.  She no longer has numbness and tingling.  She has returned to work.  She is doing very well.  She is not having any major postoperative issues.  Continues to improve.  Will continue to be available as needed.   Advised to contact the office if any questions or concerns arise.   Penne MICAEL Sharps, MD Dept of Neurosurgery

## 2023-11-07 ENCOUNTER — Other Ambulatory Visit: Payer: Self-pay

## 2024-03-05 ENCOUNTER — Ambulatory Visit: Admitting: Physician Assistant

## 2024-03-24 ENCOUNTER — Ambulatory Visit: Admitting: Physician Assistant
